# Patient Record
Sex: Female | Born: 1985 | Race: White | Hispanic: No | Smoking: Former smoker
Health system: Southern US, Community
[De-identification: ages and names within clinical notes are randomized; demographics above are authoritative.]

## PROBLEM LIST (undated history)

## (undated) DIAGNOSIS — R008 Other abnormalities of heart beat: Secondary | ICD-10-CM

## (undated) DIAGNOSIS — R87629 Unspecified abnormal cytological findings in specimens from vagina: Secondary | ICD-10-CM

## (undated) DIAGNOSIS — O1490 Unspecified pre-eclampsia, unspecified trimester: Secondary | ICD-10-CM

## (undated) DIAGNOSIS — G43909 Migraine, unspecified, not intractable, without status migrainosus: Secondary | ICD-10-CM

## (undated) DIAGNOSIS — F1111 Opioid abuse, in remission: Secondary | ICD-10-CM

## (undated) DIAGNOSIS — T4145XA Adverse effect of unspecified anesthetic, initial encounter: Secondary | ICD-10-CM

## (undated) DIAGNOSIS — T8859XA Other complications of anesthesia, initial encounter: Secondary | ICD-10-CM

## (undated) HISTORY — PX: PATELLA FRACTURE SURGERY: SHX735

## (undated) HISTORY — DX: Unspecified abnormal cytological findings in specimens from vagina: R87.629

## (undated) HISTORY — PX: CHOLECYSTECTOMY: SHX55

## (undated) HISTORY — PX: OTHER SURGICAL HISTORY: SHX169

## (undated) HISTORY — PX: FOOT SURGERY: SHX648

## (undated) HISTORY — PX: TONSILLECTOMY: SUR1361

---

## 2014-05-18 ENCOUNTER — Encounter (HOSPITAL_COMMUNITY): Payer: Self-pay

## 2014-05-18 ENCOUNTER — Emergency Department (HOSPITAL_COMMUNITY)
Admission: EM | Admit: 2014-05-18 | Discharge: 2014-05-18 | Disposition: A | Discharge status: Home / Self Care | Payer: Medicaid - Out of State | Attending: Emergency Medicine | Admitting: Emergency Medicine

## 2014-05-18 DIAGNOSIS — B86 Scabies: Secondary | ICD-10-CM | POA: Insufficient documentation

## 2014-05-18 DIAGNOSIS — Z79899 Other long term (current) drug therapy: Secondary | ICD-10-CM | POA: Diagnosis not present

## 2014-05-18 DIAGNOSIS — T2040XA Corrosion of unspecified degree of head, face, and neck, unspecified site, initial encounter: Secondary | ICD-10-CM | POA: Insufficient documentation

## 2014-05-18 DIAGNOSIS — Y9289 Other specified places as the place of occurrence of the external cause: Secondary | ICD-10-CM | POA: Diagnosis not present

## 2014-05-18 DIAGNOSIS — Z7952 Long term (current) use of systemic steroids: Secondary | ICD-10-CM | POA: Insufficient documentation

## 2014-05-18 DIAGNOSIS — Y93E8 Activity, other personal hygiene: Secondary | ICD-10-CM | POA: Diagnosis not present

## 2014-05-18 DIAGNOSIS — Z79818 Long term (current) use of other agents affecting estrogen receptors and estrogen levels: Secondary | ICD-10-CM | POA: Diagnosis not present

## 2014-05-18 DIAGNOSIS — R21 Rash and other nonspecific skin eruption: Secondary | ICD-10-CM | POA: Diagnosis present

## 2014-05-18 DIAGNOSIS — T550X1A Toxic effect of soaps, accidental (unintentional), initial encounter: Secondary | ICD-10-CM | POA: Insufficient documentation

## 2014-05-18 MED ORDER — PREDNISONE 10 MG PO TABS: ORAL_TABLET | ORAL | Status: DC

## 2014-05-18 MED ORDER — PREDNISONE 50 MG PO TABS
60.0000 mg | ORAL_TABLET | Freq: Once | ORAL | Status: AC
  Administered 2014-05-18: 60 mg via ORAL
  Filled 2014-05-18 (×2): qty 1

## 2014-05-18 MED ORDER — HYDROCODONE-ACETAMINOPHEN 5-325 MG PO TABS: 2.0000 | ORAL_TABLET | ORAL | Status: DC | PRN

## 2014-05-18 MED ORDER — HYDROCODONE-ACETAMINOPHEN 5-325 MG PO TABS
2.0000 | ORAL_TABLET | Freq: Once | ORAL | Status: AC
  Administered 2014-05-18: 2 via ORAL
  Filled 2014-05-18: qty 2

## 2014-05-18 MED ORDER — PERMETHRIN 5 % EX CREA: TOPICAL_CREAM | CUTANEOUS | Status: DC

## 2014-05-18 NOTE — Discharge Instructions (Signed)
Chemical Burn Many chemicals can burn the skin. A chemical burn should be flushed with cool water and checked by an emergency caregiver. Your skin is a natural barrier to infection. It is the largest organ of your body. Burns damage this natural protection. To help prevent infection, it is very important to follow your caregiver's instructions in the care of your burn.  Many industrial chemicals may cause burns. These chemicals include acids, alkalis, and organic compounds such as petroleum, phenol, bitumen, tar, and grease. When acids come in contact with the skin, they cause an immediate change in the skin.Acid burns produce significant pain and form a scab (eschar). Usually, the immediate skin changes are the only damage from an acid burn.However, exposure to formic acid, chromic acid, or hydrofluoric acid may affect the whole body and may even be life-threatening. Alkalis include lye, cement, lime, and many chemicals with "hydroxide" in their name.An alkali burn may be less apparent than an acid burn at first. However, alkalis may cause greater tissue damage.It is important to be aware of any chemicals you are using. Treat any exposure to skin, eyes, or mucous membranes (nose, mouth, throat) as a potential emergency. PREVENTION  Avoid exposure to toxic chemicals that can cause burns.  Store chemicals out of the reach of children.  Use protective gloves when handling dangerous chemicals. HOME CARE INSTRUCTIONS   Wash your hands well before changing your bandage.  Change your bandage as often as directed by your caregiver.  Remove the old bandage. If the bandage sticks, you may soak it off with cool, clean water.  Cleanse the burn thoroughly but gently with mild soap and water.  Pat the area dry with a clean, dry cloth.  Apply a thin layer of antibacterial cream to the burn.  Apply a clean bandage as instructed by your caregiver.  Keep the bandage as clean and dry as  possible.  Elevate the affected area for the first 24 hours, then as instructed by your caregiver.  Only take over-the-counter or prescription medicines for pain, discomfort, or fever as directed by your caregiver.  Keep all follow-up appointments.This is important. This is how your caregiver can tell if your treatment is working. SEEK IMMEDIATE MEDICAL CARE IF:   You develop excessive pain.  You develop redness, tenderness, swelling, or red streaks near the burn.  The burned area develops yellowish-white fluid (pus) or a bad smell.  You have a fever. MAKE SURE YOU:   Understand these instructions.  Will watch your condition.  Will get help right away if you are not doing well or get worse. Document Released: 04/09/2004 Document Revised: 09/26/2011 Document Reviewed: 11/29/2010 [facility] Patient Information 2015 [place], [place]. This information is not intended to replace advice given to you by your health care provider. Make sure you discuss any questions you have with your health care provider. Scabies Scabies are small bugs (mites) that burrow under the skin and cause red bumps and severe itching. These bugs can only be seen with a microscope. Scabies are highly contagious. They can spread easily from person to person by direct contact. They are also spread through sharing clothing or linens that have the scabies mites living in them. It is not unusual for an entire family to become infected through shared towels, clothing, or bedding.  HOME CARE INSTRUCTIONS   Your caregiver may prescribe a cream or lotion to kill the mites. If cream is prescribed, massage the cream into the entire body from the neck to the  bottom of both feet. Also massage the cream into the scalp and face if your child is less than 47 year old. Avoid the eyes and mouth. Do not wash your hands after application.  Leave the cream on for 8 to 12 hours. Your child should bathe or shower after the 8 to 12 hour  application period. Sometimes it is helpful to apply the cream to your child right before bedtime.  One treatment is usually effective and will eliminate approximately 95% of infestations. For severe cases, your caregiver may decide to repeat the treatment in 1 week. Everyone in your household should be treated with one application of the cream.  New rashes or burrows should not appear within 24 to 48 hours after successful treatment. However, the itching and rash may last for 2 to 4 weeks after successful treatment. Your caregiver may prescribe a medicine to help with the itching or to help the rash go away more quickly.  Scabies can live on clothing or linens for up to 3 days. All of your child's recently used clothing, towels, stuffed toys, and bed linens should be washed in hot water and then dried in a dryer for at least 20 minutes on high heat. Items that cannot be washed should be enclosed in a plastic bag for at least 3 days.  To help relieve itching, bathe your child in a cool bath or apply cool washcloths to the affected areas.  Your child may return to school after treatment with the prescribed cream. SEEK MEDICAL CARE IF:   The itching persists longer than 4 weeks after treatment.  The rash spreads or becomes infected. Signs of infection include red blisters or yellow-tan crust. Document Released: 07/04/2005 Document Revised: 09/26/2011 Document Reviewed: 11/12/2008 [facility] Patient Information 2015 [place], [place]. This information is not intended to replace advice given to you by your health care provider. Make sure you discuss any questions you have with your health care provider.

## 2014-05-18 NOTE — ED Provider Notes (Signed)
CSN: [id number]     Arrival date & time 05/18/14  1950 History   First MD Initiated Contact with Patient 05/18/14 2244     Chief Complaint  Patient presents with  . Allergic Reaction     (Consider location/radiation/quality/duration/timing/severity/associated sxs/prior Treatment) Patient is a 28 y.o. female presenting with allergic reaction. The history is provided by the patient. No language interpreter was used.  Allergic Reaction Presenting symptoms: itching, rash and swelling   Rash:    Location:  Full body   Quality: itchiness and redness     Severity:  Moderate   Onset quality:  Gradual   Timing:  Constant   Progression:  Worsening Severity:  Severe Prior allergic episodes:  No prior episodes Context: chemicals   Relieved by:  Nothing Worsened by:  Nothing tried Ineffective treatments:  None tried Pt used a friends face wash on Thursday.   Pt reports now face is red, skin feels tight and is painful.  Pt also complains of itchy rash.  Worse on abdomen  History reviewed. No pertinent past medical history. Past Surgical History  Procedure Laterality Date  . Tonsillectomy    . Cholecystectomy     No family history on file. History  Substance Use Topics  . Smoking status: Never Smoker   . Smokeless tobacco: Not on file  . Alcohol Use: No   OB History    No data available     Review of Systems  Skin: Positive for itching and rash.  All other systems reviewed and are negative.     Allergies  Dilaudid and Morphine and related  Home Medications   Prior to Admission medications   Medication Sig Start Date End Date Taking? Authorizing Provider  ibuprofen (ADVIL,MOTRIN) 200 MG tablet Take 200 mg by mouth every 6 (six) hours as needed for mild pain or moderate pain.   Yes Historical Provider, MD  medroxyPROGESTERone (DEPO-PROVERA) 150 MG/ML injection Inject 150 mg into the muscle every 3 (three) months.   Yes Historical Provider, MD  HYDROcodone-acetaminophen  (NORCO/VICODIN) 5-325 MG per tablet Take 2 tablets by mouth every 4 (four) hours as needed. 05/18/14   Fransico Meadow, PA-C  permethrin (ELIMITE) 5 % cream Apply to affected area once 05/18/14   Fransico Meadow, PA-C  predniSONE (DELTASONE) 10 MG tablet 5,4,3,2,1 taper 05/18/14   Hollace Kinnier Valeri Sula, PA-C   BP 127/70 mmHg  Pulse 52  Temp(Src) 98.2 F (36.8 C) (Oral)  Resp 24  Ht 5\' 3"  (1.6 m)  Wt 136 lb (61.689 kg)  BMI 24.10 kg/m2  SpO2 96% Physical Exam  Constitutional: She is oriented to person, place, and time. She appears well-developed and well-nourished.  HENT:  Head: Normocephalic.  Erythema and swelling face.  Scabbed area forehead and nose.  Body,  Multiple burrow looking areas  Eyes: EOM are normal. Pupils are equal, round, and reactive to light.  Neck: Normal range of motion.  Cardiovascular: Normal rate.   Pulmonary/Chest: Effort normal.  Abdominal: She exhibits no distension.  Musculoskeletal: Normal range of motion.  Neurological: She is alert and oriented to person, place, and time.  Psychiatric: She has a normal mood and affect.  Nursing note and vitals reviewed.   ED Course  Procedures (including critical care time) Labs Review Labs Reviewed - No data to display  Imaging Review No results found.   EKG Interpretation None      MDM  I think rash on body arms and legs is probably scabies.  Face redness and swelling second to allergic reaction/now chemical burn to face.    Final diagnoses:  Chemical burn of face, initial encounter  Scabies    elemite Prednisone hydrocodone    Fransico Meadow, PA-C 05/19/14 [place], PA-C 05/19/14 0009

## 2014-05-18 NOTE — ED Notes (Signed)
I used a different face wash on Thursday. Friday I used my normal face wash. I used my moisturizer on Friday and it has tanning properties in it. I probably should not have used it. My skin felt different yesterday and was red last night and got worse today.

## 2014-05-18 NOTE — ED Notes (Signed)
Pt alert & oriented x4, stable gait. Patient given discharge instructions, paperwork & prescription(s). Patient  instructed to stop at the registration desk to finish any additional paperwork. Patient verbalized understanding. Pt left department w/ no further questions. 

## 2016-02-16 ENCOUNTER — Emergency Department (HOSPITAL_COMMUNITY): Payer: Medicaid Other

## 2016-02-16 ENCOUNTER — Encounter (HOSPITAL_COMMUNITY): Payer: Self-pay | Admitting: Emergency Medicine

## 2016-02-16 ENCOUNTER — Emergency Department (HOSPITAL_COMMUNITY)
Admission: EM | Admit: 2016-02-16 | Discharge: 2016-02-17 | Disposition: A | Discharge status: Home / Self Care | Payer: Medicaid Other | Attending: Emergency Medicine | Admitting: Emergency Medicine

## 2016-02-16 DIAGNOSIS — Z79899 Other long term (current) drug therapy: Secondary | ICD-10-CM | POA: Diagnosis not present

## 2016-02-16 DIAGNOSIS — R102 Pelvic and perineal pain: Secondary | ICD-10-CM

## 2016-02-16 DIAGNOSIS — Z3A01 Less than 8 weeks gestation of pregnancy: Secondary | ICD-10-CM | POA: Diagnosis not present

## 2016-02-16 DIAGNOSIS — O2341 Unspecified infection of urinary tract in pregnancy, first trimester: Secondary | ICD-10-CM | POA: Diagnosis not present

## 2016-02-16 DIAGNOSIS — O26899 Other specified pregnancy related conditions, unspecified trimester: Secondary | ICD-10-CM

## 2016-02-16 DIAGNOSIS — Z3490 Encounter for supervision of normal pregnancy, unspecified, unspecified trimester: Secondary | ICD-10-CM

## 2016-02-16 DIAGNOSIS — N390 Urinary tract infection, site not specified: Secondary | ICD-10-CM

## 2016-02-16 HISTORY — DX: Other abnormalities of heart beat: R00.8

## 2016-02-16 LAB — URINALYSIS, ROUTINE W REFLEX MICROSCOPIC
Bilirubin Urine: NEGATIVE
Glucose, UA: NEGATIVE mg/dL
Ketones, ur: NEGATIVE mg/dL
Nitrite: NEGATIVE
Protein, ur: 30 mg/dL — AB
Specific Gravity, Urine: 1.029 (ref 1.005–1.030)
pH: 6.5 (ref 5.0–8.0)

## 2016-02-16 LAB — WET PREP, GENITAL
Clue Cells Wet Prep HPF POC: NONE SEEN
Sperm: NONE SEEN
Trich, Wet Prep: NONE SEEN
Yeast Wet Prep HPF POC: NONE SEEN

## 2016-02-16 LAB — I-STAT BETA HCG BLOOD, ED (MC, WL, AP ONLY): I-stat hCG, quantitative: 705.2 m[IU]/mL — ABNORMAL HIGH (ref <5)

## 2016-02-16 LAB — URINE MICROSCOPIC-ADD ON

## 2016-02-16 LAB — PREGNANCY, URINE: Preg Test, Ur: POSITIVE — AB

## 2016-02-16 NOTE — ED Notes (Signed)
Provider in room  

## 2016-02-16 NOTE — ED Provider Notes (Signed)
[facility] DEPT Provider Note   CSN: [id number] Arrival date & time: 02/16/16  1836  First Provider Contact:  First MD Initiated Contact with Patient 02/16/16 2148        History   Chief Complaint Chief Complaint  Patient presents with  . Pelvic Pain    HPI Kristy Hunt is a 30 y.o. female.  Patient presents with pelvic pain. She states she's had about a 2 to three-week history of pelvic pain. It's been constant and worsening. She's been seeing her PCP at [facility]. She initially was diagnosed with a UTI and took 2 rounds of antibiotics. She then had a pelvic exam and was treated with Rocephin and doxycycline. She's having no improvement in symptoms. She denies any vaginal discharge. No bleeding. She denies any vomiting although she's had a little bit of nausea which each release of the pain. She denies any known fevers.    Pelvic Pain  Pertinent negatives include no chest pain, no abdominal pain, no headaches and no shortness of breath.    Past Medical History:  Diagnosis Date  . Bigeminal pulse     There are no active problems to display for this patient.   Past Surgical History:  Procedure Laterality Date  . CHOLECYSTECTOMY    . infection in foot removed     . TONSILLECTOMY      OB History    No data available       Home Medications    Prior to Admission medications   Medication Sig Start Date End Date Taking? Authorizing Provider  doxycycline (VIBRAMYCIN) 100 MG capsule Take 100 mg by mouth 2 (two) times daily.   Yes Historical Provider, MD  gabapentin (NEURONTIN) 300 MG capsule Take 300 mg by mouth 2 (two) times daily.   Yes Historical Provider, MD    Family History No family history on file.  Social History Social History  Substance Use Topics  . Smoking status: Never Smoker  . Smokeless tobacco: Never Used  . Alcohol use No     Allergies   Dilaudid [hydromorphone hcl] and Morphine and related   Review of  Systems Review of Systems  Constitutional: Negative for chills, diaphoresis, fatigue and fever.  HENT: Negative for congestion, rhinorrhea and sneezing.   Eyes: Negative.   Respiratory: Negative for cough, chest tightness and shortness of breath.   Cardiovascular: Negative for chest pain and leg swelling.  Gastrointestinal: Positive for nausea. Negative for abdominal pain, blood in stool, diarrhea and vomiting.  Genitourinary: Positive for pelvic pain. Negative for difficulty urinating, flank pain, frequency and hematuria.  Musculoskeletal: Negative for arthralgias and back pain.  Skin: Negative for rash.  Neurological: Negative for dizziness, speech difficulty, weakness, numbness and headaches.     Physical Exam Updated Vital Signs BP 117/86 (BP Location: Left Arm)   Pulse 67   Temp 98 F (36.7 C) (Oral)   Resp 17   Ht 5\' 3"  (1.6 m)   Wt 129 lb (58.5 kg)   LMP 01/11/2016   SpO2 100%   BMI 22.85 kg/m   Physical Exam  Constitutional: She is oriented to person, place, and time. She appears well-developed and well-nourished.  HENT:  Head: Normocephalic and atraumatic.  Eyes: Pupils are equal, round, and reactive to light.  Neck: Normal range of motion. Neck supple.  Cardiovascular: Normal rate, regular rhythm and normal heart sounds.   Pulmonary/Chest: Effort normal and breath sounds normal. No respiratory distress. She has no wheezes. She has no rales.  She exhibits no tenderness.  Abdominal: Soft. Bowel sounds are normal. There is no tenderness. There is no rebound and no guarding.  Genitourinary:  Genitourinary Comments: Positive tenderness to suprapubic area. Pelvic exam shows scant vaginal discharge. Positive tenderness over the uterus. There is mild tenderness over the adnexa bilaterally. No rashes are noted.  Musculoskeletal: Normal range of motion. She exhibits no edema.  Lymphadenopathy:    She has no cervical adenopathy.  Neurological: She is alert and oriented to  person, place, and time.  Skin: Skin is warm and dry. No rash noted.  Psychiatric: She has a normal mood and affect.     ED Treatments / Results  Labs (all labs ordered are listed, but only abnormal results are displayed) Labs Reviewed  WET PREP, GENITAL  PREGNANCY, URINE  URINALYSIS, ROUTINE W REFLEX MICROSCOPIC (NOT AT [facility])  GC/CHLAMYDIA PROBE AMP ([facility]) NOT AT [facility]    EKG  EKG Interpretation None       Radiology No results found.  Procedures Procedures (including critical care time)  Medications Ordered in ED Medications - No data to display   Initial Impression / Assessment and Plan / ED Course  I have reviewed the triage vital signs and the nursing notes.  Pertinent labs & imaging results that were available during my care of the patient were reviewed by me and considered in my medical decision making (see chart for details).  Clinical Course    PT awaiting pelvic US.  +preg test.  Dr. Randal Buba to follow.  Final Clinical Impressions(s) / ED Diagnoses   Final diagnoses:  None    New Prescriptions New Prescriptions   No medications on file     Malvin Johns, MD 02/17/16 1404

## 2016-02-16 NOTE — Progress Notes (Signed)
Patient listed as having Medicaid out of state.  EDCM went to speak to patient at bedside, however doctor at bedside performing a procedure.  Patient will need to apply for Medicaid here in [place] if her Medicaid is from [facility] office  [facility] of [facility] [address] [place]

## 2016-02-16 NOTE — ED Triage Notes (Signed)
Patient states that she has pelvic pain for 3 weeks . Patient was recently treated with antibiotics for UTI symptoms and went back on Friday because symptoms still not getting any better after being on antibiotics.  Patient had pelvic exam and supposed to go for scan today but heard nothing from MD office about scan.

## 2016-02-17 LAB — GC/CHLAMYDIA PROBE AMP (~~LOC~~) NOT AT ARMC
Chlamydia: NEGATIVE
Neisseria Gonorrhea: NEGATIVE

## 2016-02-17 MED ORDER — NITROFURANTOIN MONOHYD MACRO 100 MG PO CAPS: 100.0000 mg | ORAL_CAPSULE | Freq: Two times a day (BID) | ORAL | 0 refills | Status: DC

## 2016-02-18 ENCOUNTER — Encounter (HOSPITAL_COMMUNITY): Payer: Self-pay | Admitting: *Deleted

## 2016-02-18 ENCOUNTER — Inpatient Hospital Stay (HOSPITAL_COMMUNITY): Payer: Medicaid Other

## 2016-02-18 ENCOUNTER — Inpatient Hospital Stay (HOSPITAL_COMMUNITY)
Admission: AD | Admit: 2016-02-18 | Discharge: 2016-02-18 | Disposition: A | Discharge status: Home / Self Care | Payer: Medicaid Other | Source: Ambulatory Visit | Attending: Family Medicine | Admitting: Family Medicine

## 2016-02-18 DIAGNOSIS — O3680X0 Pregnancy with inconclusive fetal viability, not applicable or unspecified: Secondary | ICD-10-CM

## 2016-02-18 DIAGNOSIS — O209 Hemorrhage in early pregnancy, unspecified: Secondary | ICD-10-CM | POA: Insufficient documentation

## 2016-02-18 DIAGNOSIS — O9989 Other specified diseases and conditions complicating pregnancy, childbirth and the puerperium: Secondary | ICD-10-CM | POA: Diagnosis not present

## 2016-02-18 DIAGNOSIS — O26891 Other specified pregnancy related conditions, first trimester: Secondary | ICD-10-CM | POA: Insufficient documentation

## 2016-02-18 DIAGNOSIS — R109 Unspecified abdominal pain: Secondary | ICD-10-CM | POA: Insufficient documentation

## 2016-02-18 DIAGNOSIS — O26899 Other specified pregnancy related conditions, unspecified trimester: Secondary | ICD-10-CM

## 2016-02-18 HISTORY — DX: Other complications of anesthesia, initial encounter: T88.59XA

## 2016-02-18 HISTORY — DX: Adverse effect of unspecified anesthetic, initial encounter: T41.45XA

## 2016-02-18 LAB — URINE MICROSCOPIC-ADD ON

## 2016-02-18 LAB — URINALYSIS, ROUTINE W REFLEX MICROSCOPIC
Bilirubin Urine: NEGATIVE
Glucose, UA: NEGATIVE mg/dL
Ketones, ur: 15 mg/dL — AB
Leukocytes, UA: NEGATIVE
Nitrite: NEGATIVE
Protein, ur: 100 mg/dL — AB
Specific Gravity, Urine: >1.03 — ABNORMAL HIGH (ref 1.005–1.030)
pH: 6 (ref 5.0–8.0)

## 2016-02-18 LAB — HCG, QUANTITATIVE, PREGNANCY: hCG, Beta Chain, Quant, S: 1016 m[IU]/mL — ABNORMAL HIGH (ref <5)

## 2016-02-18 LAB — ABO/RH: ABO/RH(D): A POS

## 2016-02-18 MED ORDER — OXYCODONE-ACETAMINOPHEN 5-325 MG PO TABS: 1.0000 | ORAL_TABLET | ORAL | 0 refills | Status: DC | PRN

## 2016-02-18 MED ORDER — OXYCODONE-ACETAMINOPHEN 5-325 MG PO TABS
1.0000 | ORAL_TABLET | Freq: Once | ORAL | Status: AC
  Administered 2016-02-18: 1 via ORAL
  Filled 2016-02-18: qty 1

## 2016-02-18 NOTE — Discharge Instructions (Signed)

## 2016-02-18 NOTE — MAU Note (Signed)
Patient presents at [redacted] weeks gestation with c/o pelvic pressure since the 15th of July and vaginal spotting since the 15th of July as well.

## 2016-02-18 NOTE — MAU Provider Note (Signed)
History     CSN: [id number]  Arrival date and time: 02/18/16 1613   None     Chief Complaint  Patient presents with  . Pelvic Pressure  . Vaginal Bleeding   HPI   Ms.Kristy Hunt is a 30 y.o. female G4P3 @[redacted]w[redacted]d  here in MAU with pelvic pressure and spotting. She was seen 2 days ago at [facility] for abdominal pain. She has had this pain off and on for a few weeks. She was seen by her PCP in July and was told she had a UTI and was treated for this. She was then told she had a pelvis infection and was placed on Doxy. She feels the pain is related to the pregnancy. She doesn't feel like the pain is "normal".   She feels the pain is more like pressure in her lower pelvis. The pain/pressure worsens when she is standing.  She started spotting today. The spotting is very light.   OB History    Gravida Para Term Preterm AB Living   4 3       3    SAB TAB Ectopic Multiple Live Births                  Past Medical History:  Diagnosis Date  . Bigeminal pulse   . Complication of anesthesia     Past Surgical History:  Procedure Laterality Date  . CHOLECYSTECTOMY    . infection in foot removed     . TONSILLECTOMY      History reviewed. No pertinent family history.  Social History  Substance Use Topics  . Smoking status: Never Smoker  . Smokeless tobacco: Never Used  . Alcohol use No    Allergies:  Allergies  Allergen Reactions  . Dilaudid [Hydromorphone Hcl] Hives and Shortness Of Breath  . Morphine And Related Hives and Shortness Of Breath    Prescriptions Prior to Admission  Medication Sig Dispense Refill Last Dose  . gabapentin (NEURONTIN) 300 MG capsule Take 300 mg by mouth 2 (two) times daily.   02/18/2016 at Unknown time  . nitrofurantoin, macrocrystal-monohydrate, (MACROBID) 100 MG capsule Take 1 capsule (100 mg total) by mouth 2 (two) times daily. X 7 days (Patient not taking: Reported on 02/18/2016) 14 capsule 0 Not Taking at Unknown time  .  nitrofurantoin, macrocrystal-monohydrate, (MACROBID) 100 MG capsule Take 1 capsule (100 mg total) by mouth 2 (two) times daily. X 7 days (Patient not taking: Reported on 02/18/2016) 14 capsule 0 Not Taking at Unknown time   Results for orders placed or performed during the hospital encounter of 02/18/16 (from the past 48 hour(s))  Urinalysis, Routine w reflex microscopic (not at [facility])     Status: Abnormal   Collection Time: 02/18/16  4:20 PM  Result Value Ref Range   Color, Urine YELLOW YELLOW   APPearance HAZY (A) CLEAR   Specific Gravity, Urine >1.030 (H) 1.005 - 1.030   pH 6.0 5.0 - 8.0   Glucose, UA NEGATIVE NEGATIVE mg/dL   Hgb urine dipstick LARGE (A) NEGATIVE   Bilirubin Urine NEGATIVE NEGATIVE   Ketones, ur 15 (A) NEGATIVE mg/dL   Protein, ur 100 (A) NEGATIVE mg/dL   Nitrite NEGATIVE NEGATIVE   Leukocytes, UA NEGATIVE NEGATIVE  Urine microscopic-add on     Status: Abnormal   Collection Time: 02/18/16  4:20 PM  Result Value Ref Range   Squamous Epithelial / LPF 0-5 (A) NONE SEEN   WBC, UA 0-5 0 - 5 WBC/hpf   RBC /  HPF 6-30 0 - 5 RBC/hpf   Bacteria, UA FEW (A) NONE SEEN   Urine-Other MUCOUS PRESENT   ABO/Rh     Status: None (Preliminary result)   Collection Time: 02/18/16  5:33 PM  Result Value Ref Range   ABO/RH(D) A POS   hCG, quantitative, pregnancy     Status: Abnormal   Collection Time: 02/18/16  5:33 PM  Result Value Ref Range   hCG, Beta Chain, Quant, S 1,016 (H) <5 mIU/mL    Comment:          GEST. AGE      CONC.  (mIU/mL)   <=1 WEEK        5 - 50     2 WEEKS       50 - 500     3 WEEKS       100 - 10,000     4 WEEKS     1,000 - 30,000     5 WEEKS     3,500 - 115,000   6-8 WEEKS     12,000 - 270,000    12 WEEKS     15,000 - 220,000        FEMALE AND NON-PREGNANT FEMALE:     LESS THAN 5 mIU/mL    US Ob Transvaginal  Result Date: 02/18/2016 CLINICAL DATA:  30 year old female with worsening abdominal pain in the first trimester of pregnancy. Early IUP suspected  on ultrasound 2 days ago. Subsequent encounter. Quantitative beta HCG 1,016 today, versus 705 2 days ago. Estimated gestational age by LMP 5 weeks 3 days. EXAM: TRANSVAGINAL OB ULTRASOUND TECHNIQUE: Transvaginal ultrasound was performed for complete evaluation of the gestation as well as the maternal uterus, adnexal regions, and pelvic cul-de-sac. COMPARISON:  02/16/2016 FINDINGS: Intrauterine gestational sac: 3.6 cm elongated fluid collection in the endometrial canal, which may reflect an irregularly shaped gestational sac. This appears more elongated than 2 days ago. Yolk sac:  Not visible Embryo:  Questionably visible on images 39 and 48 Cardiac Activity: None detected Presumed MSD: 17.7  mm   6 w   5  d Presumed CRL:   2.1  mm   5 w 5 d Maternal uterus/adnexae: No subchorionic hemorrhage or pelvic free fluid. Corpus luteum demonstrated in the right ovary which measures 4.5 x 2.9 x 3.5 cm. Normal left ovary which measures 1.8 x 3.0 x 1.7 cm. IMPRESSION: Findings are suspicious but not yet definitive for failed pregnancy. Recommend follow-up US in 10-14 days for definitive diagnosis. This recommendation follows SRU consensus guidelines: Diagnostic Criteria for Nonviable Pregnancy Early in the First Trimester. [place] Med [id number]. Electronically Signed   By: Genevie Ann M.D.   On: 02/18/2016 19:13    Review of Systems  Constitutional: Negative for chills and fever.  Gastrointestinal: Positive for abdominal pain. Negative for constipation, diarrhea, nausea and vomiting.  Genitourinary: Negative for dysuria, frequency and urgency.       + Pressure    Physical Exam   Blood pressure 111/69, pulse 79, temperature 98.7 F (37.1 C), temperature source Oral, resp. rate 16, height 5\' 3"  (1.6 m), weight 129 lb (58.5 kg), last menstrual period 01/11/2016.  Physical Exam  Constitutional: She is oriented to person, place, and time. She appears well-developed and well-nourished. No distress.  HENT:  Head:  Normocephalic.  Eyes: Pupils are equal, round, and reactive to light.  Neck: Neck supple.  Respiratory: Effort normal.  GI: Soft. There is tenderness in the suprapubic area.  There is no rigidity, no rebound and no guarding.  Genitourinary:  Genitourinary Comments: Bimanual exam: Cervix closed, no CMT. No blood noted on exam Uterus non tender, slightly enlarged. Adnexa non tender, no masses bilaterally Chaperone present for exam.  Musculoskeletal: Normal range of motion.  Neurological: She is alert and oriented to person, place, and time.  Skin: Skin is warm. She is not diaphoretic.  Psychiatric: Her behavior is normal.    MAU Course  Procedures  None  MDM  Urine culture pending  HCG 8/1- 705 HCG 8/3- 1016  [place]   A positive blood type Discussed patient with Dr. Nehemiah Settle Percocet 1 tab. Will evaluate pain level in 1 hour and if some improvement will send her home and plan for her to return in 48 hours for quant.   Report given to Marcille Buffy CNM who resumes care of the patient.  2040: Patient reports that her pain is improving with percocet. Will DC home and FU as planned.   Assessment and Plan   A:  1. Pregnancy of unknown anatomic location   2. Abdominal pain in pregnancy     P:  Discharge home in stable condition Ectopic precautions Pelvic rest  Return to MAU in 48 hours for repeat Quant or sooner if symptoms worsen Bleeding precautions.   Lezlie Lye, NP 02/18/2016 6:21 PM

## 2016-02-19 LAB — URINE CULTURE

## 2016-02-20 ENCOUNTER — Encounter (HOSPITAL_COMMUNITY): Payer: Self-pay | Admitting: *Deleted

## 2016-02-20 ENCOUNTER — Inpatient Hospital Stay (HOSPITAL_COMMUNITY): Payer: Medicaid Other

## 2016-02-20 ENCOUNTER — Inpatient Hospital Stay (HOSPITAL_COMMUNITY)
Admission: AD | Admit: 2016-02-20 | Discharge: 2016-02-21 | Disposition: A | Discharge status: Home / Self Care | Payer: Medicaid Other | Source: Ambulatory Visit | Attending: Family Medicine | Admitting: Family Medicine

## 2016-02-20 DIAGNOSIS — R109 Unspecified abdominal pain: Secondary | ICD-10-CM

## 2016-02-20 DIAGNOSIS — O26891 Other specified pregnancy related conditions, first trimester: Secondary | ICD-10-CM | POA: Diagnosis present

## 2016-02-20 DIAGNOSIS — O3680X0 Pregnancy with inconclusive fetal viability, not applicable or unspecified: Secondary | ICD-10-CM

## 2016-02-20 DIAGNOSIS — O26899 Other specified pregnancy related conditions, unspecified trimester: Secondary | ICD-10-CM

## 2016-02-20 DIAGNOSIS — R102 Pelvic and perineal pain: Secondary | ICD-10-CM | POA: Insufficient documentation

## 2016-02-20 DIAGNOSIS — O9989 Other specified diseases and conditions complicating pregnancy, childbirth and the puerperium: Secondary | ICD-10-CM | POA: Diagnosis not present

## 2016-02-20 DIAGNOSIS — Z3A01 Less than 8 weeks gestation of pregnancy: Secondary | ICD-10-CM | POA: Diagnosis not present

## 2016-02-20 LAB — URINE MICROSCOPIC-ADD ON

## 2016-02-20 LAB — URINALYSIS, ROUTINE W REFLEX MICROSCOPIC
Bilirubin Urine: NEGATIVE
Glucose, UA: NEGATIVE mg/dL
Ketones, ur: 15 mg/dL — AB
Nitrite: NEGATIVE
Protein, ur: 100 mg/dL — AB
Specific Gravity, Urine: 1.02 (ref 1.005–1.030)
pH: 7 (ref 5.0–8.0)

## 2016-02-20 LAB — HCG, QUANTITATIVE, PREGNANCY: hCG, Beta Chain, Quant, S: 2321 m[IU]/mL — ABNORMAL HIGH (ref <5)

## 2016-02-20 LAB — CBC
HCT: 37.3 % (ref 36.0–46.0)
Hemoglobin: 12.8 g/dL (ref 12.0–15.0)
MCH: 27.6 pg (ref 26.0–34.0)
MCHC: 34.3 g/dL (ref 30.0–36.0)
MCV: 80.6 fL (ref 78.0–100.0)
Platelets: 177 10*3/uL (ref 150–400)
RBC: 4.63 MIL/uL (ref 3.87–5.11)
RDW: 12.7 % (ref 11.5–15.5)
WBC: 6.5 10*3/uL (ref 4.0–10.5)

## 2016-02-20 LAB — WET PREP, GENITAL
Clue Cells Wet Prep HPF POC: NONE SEEN
Sperm: NONE SEEN
Trich, Wet Prep: NONE SEEN
Yeast Wet Prep HPF POC: NONE SEEN

## 2016-02-20 MED ORDER — OXYCODONE-ACETAMINOPHEN 5-325 MG PO TABS
2.0000 | ORAL_TABLET | Freq: Once | ORAL | Status: AC
  Administered 2016-02-20: 2 via ORAL
  Filled 2016-02-20: qty 2

## 2016-02-20 NOTE — MAU Provider Note (Signed)
History     CSN: [id number]  Arrival date and time: 02/20/16 2032   First Provider Initiated Contact with Patient 02/20/16 2147       Chief Complaint  Patient presents with  . Pelvic Pain  . Follow-up    BHCG   HPI Kristy Hunt is a 30 y.o. G4P3 at [redacted]w[redacted]d by LMP who presents for follow up BHCG & pelvic pain. Pt initially seen at [facility] on 8/1 for pelvic pain & found to be pregnant. BHCG on that day was 705 & ultrasound showed "probable irregular gestational sac containing a fetal pole". Returned to MAU 8/3 for f/u BHCG, that day was 1016 & ultrasound showed possible irregularly shaped IUGS, no yolk sac. Pt presents today with worsening pelvic pain. Describes pain as tingling & pressure. Rates pain 9/10. Has been treating with percocet that was prescribed to her during her last visit; moderate improvement in pain. Pain aggravated by standing & movement.  Denies vaginal bleeding, n/v/d, constipation, dysuria. Noticed moderate amount of vaginal discharge x 1 today; no irritation or odor, doesn't know what discharge looked like.   OB History    Gravida Para Term Preterm AB Living   4 3       3    SAB TAB Ectopic Multiple Live Births                  Past Medical History:  Diagnosis Date  . Bigeminal pulse   . Complication of anesthesia     Past Surgical History:  Procedure Laterality Date  . CHOLECYSTECTOMY    . infection in foot removed     . TONSILLECTOMY      History reviewed. No pertinent family history.  Social History  Substance Use Topics  . Smoking status: Never Smoker  . Smokeless tobacco: Never Used  . Alcohol use No    Allergies:  Allergies  Allergen Reactions  . Dilaudid [Hydromorphone Hcl] Hives and Shortness Of Breath  . Morphine And Related Hives and Shortness Of Breath    Pt states she can tolerate Percocet    Prescriptions Prior to Admission  Medication Sig Dispense Refill Last Dose  . gabapentin (NEURONTIN) 300 MG capsule Take 300 mg by  mouth 2 (two) times daily.   02/20/2016 at Unknown time  . oxyCODONE-acetaminophen (PERCOCET/ROXICET) 5-325 MG tablet Take 1-2 tablets by mouth every 4 (four) hours as needed for severe pain. 10 tablet 0 02/20/2016 at 1600    Review of Systems  Constitutional: Negative for chills and fever.  Gastrointestinal: Positive for abdominal pain and nausea. Negative for constipation, diarrhea and vomiting.  Genitourinary: Negative for dysuria.       + vaginal discharge No vaginal bleeding   Physical Exam   Blood pressure 112/68, pulse 78, temperature 98.3 F (36.8 C), temperature source Oral, resp. rate 16, height 5\' 3"  (1.6 m), weight 130 lb (59 kg), last menstrual period 01/11/2016, SpO2 97 %.  Physical Exam  Nursing note and vitals reviewed. Constitutional: She is oriented to person, place, and time. She appears well-developed and well-nourished. No distress.  HENT:  Head: Normocephalic and atraumatic.  Eyes: Conjunctivae are normal. Right eye exhibits no discharge. Left eye exhibits no discharge. No scleral icterus.  Neck: Normal range of motion.  Cardiovascular: Normal rate, regular rhythm and normal heart sounds.   No murmur heard. Respiratory: Effort normal and breath sounds normal. No respiratory distress. She has no wheezes.  GI: Soft. Bowel sounds are normal. She exhibits no distension. There is  tenderness in the right lower quadrant. There is no rebound and no guarding.  Genitourinary: Uterus normal. Cervix exhibits no motion tenderness and no friability. Right adnexum displays no mass, no tenderness and no fullness. Left adnexum displays no mass, no tenderness and no fullness. No bleeding in the vagina. Vaginal discharge (moderate amount of thin tan discharge) found.  Genitourinary Comments: Cervix closed  Neurological: She is alert and oriented to person, place, and time.  Skin: Skin is warm and dry. She is not diaphoretic.  Psychiatric: She has a normal mood and affect. Her behavior  is normal. Judgment and thought content normal.    MAU Course  Procedures Results for orders placed or performed during the hospital encounter of 02/20/16 (from the past 24 hour(s))  hCG, quantitative, pregnancy     Status: Abnormal   Collection Time: 02/20/16  8:56 PM  Result Value Ref Range   hCG, Beta Chain, Quant, S 2,321 (H) <5 mIU/mL  CBC     Status: None   Collection Time: 02/20/16  8:56 PM  Result Value Ref Range   WBC 6.5 4.0 - 10.5 K/uL   RBC 4.63 3.87 - 5.11 MIL/uL   Hemoglobin 12.8 12.0 - 15.0 g/dL   HCT 37.3 36.0 - 46.0 %   MCV 80.6 78.0 - 100.0 fL   MCH 27.6 26.0 - 34.0 pg   MCHC 34.3 30.0 - 36.0 g/dL   RDW 12.7 11.5 - 15.5 %   Platelets 177 150 - 400 K/uL  Urinalysis, Routine w reflex microscopic (not at [facility])     Status: Abnormal   Collection Time: 02/20/16  9:30 PM  Result Value Ref Range   Color, Urine YELLOW YELLOW   APPearance HAZY (A) CLEAR   Specific Gravity, Urine 1.020 1.005 - 1.030   pH 7.0 5.0 - 8.0   Glucose, UA NEGATIVE NEGATIVE mg/dL   Hgb urine dipstick MODERATE (A) NEGATIVE   Bilirubin Urine NEGATIVE NEGATIVE   Ketones, ur 15 (A) NEGATIVE mg/dL   Protein, ur 100 (A) NEGATIVE mg/dL   Nitrite NEGATIVE NEGATIVE   Leukocytes, UA SMALL (A) NEGATIVE  Urine microscopic-add on     Status: Abnormal   Collection Time: 02/20/16  9:30 PM  Result Value Ref Range   Squamous Epithelial / LPF 0-5 (A) NONE SEEN   WBC, UA 6-30 0 - 5 WBC/hpf   RBC / HPF 6-30 0 - 5 RBC/hpf   Bacteria, UA MANY (A) NONE SEEN   Crystals CA OXALATE CRYSTALS (A) NEGATIVE   Urine-Other MUCOUS PRESENT   Wet prep, genital     Status: Abnormal   Collection Time: 02/20/16 10:02 PM  Result Value Ref Range   Yeast Wet Prep HPF POC NONE SEEN NONE SEEN   Trich, Wet Prep NONE SEEN NONE SEEN   Clue Cells Wet Prep HPF POC NONE SEEN NONE SEEN   WBC, Wet Prep HPF POC FEW (A) NONE SEEN   Sperm NONE SEEN    US Ob Transvaginal  Result Date: 02/20/2016 CLINICAL DATA:  30 year old female  pregnant female presenting with abdominal pain EXAM: TRANSVAGINAL OB ULTRASOUND TECHNIQUE: Transvaginal ultrasound was performed for complete evaluation of the gestation as well as the maternal uterus, adnexal regions, and pelvic cul-de-sac. COMPARISON:  Ultrasound dated 02/18/2016 and 02/16/2016 FINDINGS: The uterus is anteverted and appears homogeneous. 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 No intrauterine pregnancy identified. The previously seen gestational sac or fluid collection is not identified on this study. Small amount of fluid with  low level internal echoes noted within the endometrial canal. The maternal ovaries appear unremarkable. The right ovary measures 4.4 x 3.7 x 2.9 cm. The left ovary measures 3.4 x 2.1 x 2.2 cm. A corpus luteum is noted in the right ovary. No significant free fluid identified within pelvis. IMPRESSION: No intrauterine pregnancy identified. The previously seen gestational sac or loculated fluid collection is no longer present on this study. Findings most consistent with spontaneous miscarriage. Correlation with clinical exam and follow-up with serial HCG levels recommended. Unremarkable ovaries. Electronically Signed   By: Anner Crete M.D.   On: 02/20/2016 23:28    MDM Percocet 2 tabs po CBC, BHCG, ultrasound, wet prep Appropriate rise in BHCG from 2 days ago (although rise was not as expected during previous visit).  Ultrasound shows no intrauterine gestation & a probable right CLC. Previous ultrasounds reviewed by Dr. Kennon Rounds. Unsure if IUGS ever present & as pt denies vaginal bleeding unlikely that she passed POC. Can't exclude possibility of ectopic pregnancy.  Discussed findings with patient & pt will return to [facility] Tuesday morning for repeat BHCG. Assessment and Plan  A: 1. Pregnancy of unknown anatomic location   2. Abdominal pain affecting pregnancy   3. Pelvic pain affecting pregnancy in first trimester, antepartum      P: Discharge home F/u at [facility] Tuesday morning at 11 am for BHCG Discussed reasons to return to MAU Ectopic vs SAB precautions  Leauna Schmuck 02/20/2016, 9:47 PM

## 2016-02-20 NOTE — MAU Note (Signed)
Pt reports she is here for repeat labs ( have been drawn), but she is having a lot more pressure today than in the past. Denies bleeding.

## 2016-02-21 DIAGNOSIS — O9989 Other specified diseases and conditions complicating pregnancy, childbirth and the puerperium: Secondary | ICD-10-CM

## 2016-02-21 DIAGNOSIS — R109 Unspecified abdominal pain: Secondary | ICD-10-CM

## 2016-02-21 MED ORDER — OXYCODONE-ACETAMINOPHEN 5-325 MG PO TABS: 2.0000 | ORAL_TABLET | Freq: Four times a day (QID) | ORAL | 0 refills | Status: DC | PRN

## 2016-02-21 NOTE — Discharge Instructions (Signed)

## 2016-02-23 ENCOUNTER — Inpatient Hospital Stay (HOSPITAL_COMMUNITY)
Admission: AD | Admit: 2016-02-23 | Discharge: 2016-02-23 | Disposition: A | Discharge status: Home / Self Care | Payer: Medicaid Other | Source: Ambulatory Visit | Attending: Obstetrics and Gynecology | Admitting: Obstetrics and Gynecology

## 2016-02-23 ENCOUNTER — Inpatient Hospital Stay (HOSPITAL_COMMUNITY): Payer: Medicaid Other

## 2016-02-23 ENCOUNTER — Encounter (HOSPITAL_COMMUNITY): Payer: Self-pay

## 2016-02-23 ENCOUNTER — Other Ambulatory Visit: Payer: Medicaid Other

## 2016-02-23 DIAGNOSIS — O26899 Other specified pregnancy related conditions, unspecified trimester: Secondary | ICD-10-CM

## 2016-02-23 DIAGNOSIS — O26891 Other specified pregnancy related conditions, first trimester: Secondary | ICD-10-CM | POA: Insufficient documentation

## 2016-02-23 DIAGNOSIS — K5900 Constipation, unspecified: Secondary | ICD-10-CM | POA: Insufficient documentation

## 2016-02-23 DIAGNOSIS — K5901 Slow transit constipation: Secondary | ICD-10-CM | POA: Diagnosis not present

## 2016-02-23 DIAGNOSIS — Z3A01 Less than 8 weeks gestation of pregnancy: Secondary | ICD-10-CM | POA: Insufficient documentation

## 2016-02-23 DIAGNOSIS — R102 Pelvic and perineal pain: Secondary | ICD-10-CM | POA: Diagnosis not present

## 2016-02-23 DIAGNOSIS — O9989 Other specified diseases and conditions complicating pregnancy, childbirth and the puerperium: Secondary | ICD-10-CM | POA: Diagnosis not present

## 2016-02-23 DIAGNOSIS — O3680X0 Pregnancy with inconclusive fetal viability, not applicable or unspecified: Secondary | ICD-10-CM

## 2016-02-23 DIAGNOSIS — R109 Unspecified abdominal pain: Secondary | ICD-10-CM

## 2016-02-23 LAB — URINE MICROSCOPIC-ADD ON

## 2016-02-23 LAB — URINALYSIS, ROUTINE W REFLEX MICROSCOPIC
Bilirubin Urine: NEGATIVE
Glucose, UA: NEGATIVE mg/dL
Ketones, ur: NEGATIVE mg/dL
Nitrite: NEGATIVE
Protein, ur: NEGATIVE mg/dL
Specific Gravity, Urine: 1.02 (ref 1.005–1.030)
pH: 6 (ref 5.0–8.0)

## 2016-02-23 LAB — HCG, QUANTITATIVE, PREGNANCY: hCG, Beta Chain, Quant, S: 5285 m[IU]/mL — ABNORMAL HIGH (ref <5)

## 2016-02-23 MED ORDER — DOCUSATE SODIUM 250 MG PO CAPS: 250.0000 mg | ORAL_CAPSULE | Freq: Every day | ORAL | 0 refills | Status: DC

## 2016-02-23 MED ORDER — POLYETHYLENE GLYCOL 3350 17 GM/SCOOP PO POWD: ORAL | 0 refills | Status: DC

## 2016-02-23 NOTE — Discharge Instructions (Signed)
High-Fiber Diet Fiber, also called dietary fiber, is a type of carbohydrate found in fruits, vegetables, whole grains, and beans. A high-fiber diet can have many health benefits. Your health care provider may recommend a high-fiber diet to help:  Prevent constipation. Fiber can make your bowel movements more regular.  Lower your cholesterol.  Relieve hemorrhoids, uncomplicated diverticulosis, or irritable bowel syndrome.  Prevent overeating as part of a weight-loss plan.  Prevent heart disease, type 2 diabetes, and certain cancers. WHAT IS MY PLAN? The recommended daily intake of fiber includes:  38 grams for men under age 69.  28 grams for men over age 42.  16 grams for women under age 71.  86 grams for women over age 75. You can get the recommended daily intake of dietary fiber by eating a variety of fruits, vegetables, grains, and beans. Your health care provider may also recommend a fiber supplement if it is not possible to get enough fiber through your diet. WHAT DO I NEED TO KNOW ABOUT A HIGH-FIBER DIET?  Fiber supplements have not been widely studied for their effectiveness, so it is better to get fiber through food sources.  Always check the fiber content on thenutrition facts label of any prepackaged food. Look for foods that contain at least 5 grams of fiber per serving.  Ask your dietitian if you have questions about specific foods that are related to your condition, especially if those foods are not listed in the following section.  Increase your daily fiber consumption gradually. Increasing your intake of dietary fiber too quickly may cause bloating, cramping, or gas.  Drink plenty of water. Water helps you to digest fiber. WHAT FOODS CAN I EAT? Grains Whole-grain breads. Multigrain cereal. Oats and oatmeal. Brown rice. Barley. Bulgur wheat. [place]. Bran muffins. Popcorn. Rye wafer crackers. Vegetables Sweet potatoes. Spinach. Kale. Artichokes. Cabbage. Broccoli.  Green peas. Carrots. Squash. Fruits Berries. Pears. Apples. Oranges. Avocados. Prunes and raisins. Dried figs. Meats and Other Protein Sources Navy, kidney, pinto, and soy beans. Split peas. Lentils. Nuts and seeds. Dairy Fiber-fortified yogurt. Beverages Fiber-fortified soy milk. Fiber-fortified orange juice. Other Fiber bars. The items listed above may not be a complete list of recommended foods or beverages. Contact your dietitian for more options. WHAT FOODS ARE NOT RECOMMENDED? Grains White bread. Pasta made with refined flour. White rice. Vegetables Fried potatoes. Canned vegetables. Well-cooked vegetables.  Fruits Fruit juice. Cooked, strained fruit. Meats and Other Protein Sources Fatty cuts of meat. Fried Sales executive or fried fish. Dairy Milk. Yogurt. Cream cheese. Sour cream. Beverages Soft drinks. Other Cakes and pastries. Butter and oils. The items listed above may not be a complete list of foods and beverages to avoid. Contact your dietitian for more information. WHAT ARE SOME TIPS FOR INCLUDING HIGH-FIBER FOODS IN MY DIET?  Eat a wide variety of high-fiber foods.  Make sure that half of all grains consumed each day are whole grains.  Replace breads and cereals made from refined flour or white flour with whole-grain breads and cereals.  Replace white rice with brown rice, bulgur wheat, or millet.  Start the day with a breakfast that is high in fiber, such as a cereal that contains at least 5 grams of fiber per serving.  Use beans in place of meat in soups, salads, or pasta.  Eat high-fiber snacks, such as berries, raw vegetables, nuts, or popcorn.   This information is not intended to replace advice given to you by your health care provider. Make sure you discuss  any questions you have with your health care provider.   Document Released: 07/04/2005 Document Revised: 07/25/2014 Document Reviewed: 12/17/2013 Elsevier Interactive Patient Education 2016 [employer]. Constipation, Adult Constipation is when a person:  Poops (has a bowel movement) less than 3 times a week.  Has a hard time pooping.  Has poop that is dry, hard, or bigger than normal. HOME CARE   Eat foods with a lot of fiber in them. This includes fruits, vegetables, beans, and whole grains such as brown rice.  Avoid fatty foods and foods with a lot of sugar. This includes french fries, hamburgers, cookies, candy, and soda.  If you are not getting enough fiber from food, take products with added fiber in them (supplements).  Drink enough fluid to keep your pee (urine) clear or pale yellow.  Exercise on a regular basis, or as told by your doctor.  Go to the restroom when you feel like you need to poop. Do not hold it.  Only take medicine as told by your doctor. Do not take medicines that help you poop (laxatives) without talking to your doctor first. GET HELP RIGHT AWAY IF:   You have bright red blood in your poop (stool).  Your constipation lasts more than 4 days or gets worse.  You have belly (abdominal) or butt (rectal) pain.  You have thin poop (as thin as a pencil).  You lose weight, and it cannot be explained. MAKE SURE YOU:   Understand these instructions.  Will watch your condition.  Will get help right away if you are not doing well or get worse.   This information is not intended to replace advice given to you by your health care provider. Make sure you discuss any questions you have with your health care provider.   Document Released: 12/21/2007 Document Revised: 07/25/2014 Document Reviewed: 04/15/2013 Elsevier Interactive Patient Education 2016 Elsevier Inc. Interstitial Cystitis Interstitial cystitis is a condition that causes inflammation of the bladder. The bladder is a hollow organ in the lower part of your abdomen. It stores urine after the urine is made by your kidneys. With interstitial cystitis, you may have pain in the bladder area. You may  also have a frequent and urgent need to urinate. The severity of interstitial cystitis can vary from person to person. You may have flare-ups of the condition, and then it may go away for a while. For many people who have this condition, it becomes a long-term problem. CAUSES The cause of this condition is not known. RISK FACTORS This condition is more likely to develop in women. SYMPTOMS Symptoms of interstitial cystitis vary, and they can change over time. Symptoms may include:  Discomfort or pain in the bladder area. This can range from mild to severe. The pain may change in intensity as the bladder fills with urine or as it empties.  Pelvic pain.  An urgent need to urinate.  Frequent urination.  Pain during sexual intercourse.  Pinpoint bleeding on the bladder wall. For women, the symptoms often get worse during menstruation. DIAGNOSIS This condition is diagnosed by evaluating your symptoms and ruling out other causes. A physical exam will be done. Various tests may be done to rule out other conditions. Common tests include:  Urine tests.  Cystoscopy. In this test, a tool that is like a very thin telescope is used to look into your bladder.  Biopsy. This involves taking a sample of tissue from the bladder wall to be examined under a microscope. TREATMENT  There is no cure for interstitial cystitis, but treatment methods are available to control your symptoms. Work closely with your health care provider to find the treatments that will be most effective for you. Treatment options may include:  Medicines to relieve pain and to help reduce the number of times that you feel the need to urinate.  Bladder training. This involves learning ways to control when you urinate, such as:  Urinating at scheduled times.  Training yourself to delay urination.  Doing exercises (Kegel exercises) to strengthen the muscles that control urine flow.  Lifestyle changes, such as changing your  diet or taking steps to control stress.  Use of a device that provides electrical stimulation in order to reduce pain.  A procedure that stretches your bladder by filling it with air or fluid.  Surgery. This is rare. It is only done for extreme cases if other treatments do not help. HOME CARE INSTRUCTIONS  Take medicines only as directed by your health care provider.  Use bladder training techniques as directed.  Keep a bladder diary to find out which foods, liquids, or activities make your symptoms worse.  Use your bladder diary to schedule bathroom trips. If you are away from home, plan to be near a bathroom at each of your scheduled times.  Make sure you urinate just before you leave the house and just before you go to bed.  Do Kegel exercises as directed by your health care provider.  Do not drink alcohol.  Do not use any tobacco products, including cigarettes, chewing tobacco, or electronic cigarettes. If you need help quitting, ask your health care provider.  Make dietary changes as directed by your health care provider. You may need to avoid spicy foods and foods that contain a high amount of potassium.  Limit your drinking of beverages that stimulate urination. These include soda, coffee, and tea.  Keep all follow-up visits as directed by your health care provider. This is important. SEEK MEDICAL CARE IF:  Your symptoms do not get better after treatment.  Your pain and discomfort are getting worse.  You have more frequent urges to urinate.  You have a fever. SEEK IMMEDIATE MEDICAL CARE IF:  You are not able to control your bladder at all.   This information is not intended to replace advice given to you by your health care provider. Make sure you discuss any questions you have with your health care provider.   Document Released: 03/04/2004 Document Revised: 07/25/2014 Document Reviewed: 03/11/2014 Elsevier Interactive Patient Education [employer].

## 2016-02-23 NOTE — MAU Provider Note (Signed)
History     CSN: [id number]  Arrival date and time: 02/23/16 1326   None     Chief Complaint  Patient presents with  . Abdominal Pain   HPI Kristy Hunt is a 30 y.o. G4P3 at [redacted]w[redacted]d who presents to MAU today with complaint of pelvic pressure. The patient has been seen numerous times for this pregnancy over the last week. She has had multiple [place] and repeat hCG results. Quant hCG continues to rise  Appropriately even today, however patient reports more pelvic pressure. She denies vaginal bleeding, abnormal discharge or fever. The patient states that symptoms of pressure increase significantly when emptying of the bladder and upon standing. 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 She has not seen Urology recently, but her PCP has treated her numerous times for presumed UTI over the last several months without change or resolution of symptoms.   OB History    Gravida Para Term Preterm AB Living   4 3       3    SAB TAB Ectopic Multiple Live Births                  Past Medical History:  Diagnosis Date  . Bigeminal pulse   . Complication of anesthesia     Past Surgical History:  Procedure Laterality Date  . CHOLECYSTECTOMY    . infection in foot removed     . TONSILLECTOMY      No family history on file.  Social History  Substance Use Topics  . Smoking status: Never Smoker  . Smokeless tobacco: Never Used  . Alcohol use No    Allergies:  Allergies  Allergen Reactions  . Dilaudid [Hydromorphone Hcl] Hives and Shortness Of Breath  . Morphine And Related Hives and Shortness Of Breath    Pt states she can tolerate Percocet    Prescriptions Prior to Admission  Medication Sig Dispense Refill Last Dose  . gabapentin (NEURONTIN) 300 MG capsule Take 300 mg by mouth 2 (two) times daily.   02/23/2016 at Unknown time  . oxyCODONE-acetaminophen (PERCOCET/ROXICET) 5-325 MG tablet  Take 2 tablets by mouth every 6 (six) hours as needed for severe pain. 10 tablet 0 02/23/2016 at Unknown time    Review of Systems  Constitutional: Negative for chills and fever.  Gastrointestinal: Positive for abdominal pain and constipation. Negative for diarrhea, nausea and vomiting.  Genitourinary: Positive for dysuria, frequency and hematuria. Negative for flank pain and urgency.       Neg - vaginal bleeding, discharge + pelvic pressure   Physical Exam   Blood pressure 118/73, pulse 80, temperature 97.9 F (36.6 C), resp. rate 18, last menstrual period 01/11/2016.  Physical Exam  Nursing note and vitals reviewed. Constitutional: She is oriented to person, place, and time. She appears well-developed and well-nourished. No distress.  HENT:  Head: Normocephalic and atraumatic.  Cardiovascular: Normal rate.   Respiratory: Effort normal.  GI: Soft. She exhibits no distension and no mass. There is no tenderness. There is no rebound and no guarding.  Neurological: She is alert and oriented to person, place, and time.  Skin: Skin is warm and dry. No erythema.  Psychiatric: She has a normal mood and affect.    Results for orders placed or performed during the hospital encounter of 02/23/16 (from the past 24 hour(s))  Urinalysis, Routine w reflex microscopic (not at [facility])     Status: Abnormal  Collection Time: 02/23/16  2:00 PM  Result Value Ref Range   Color, Urine YELLOW YELLOW   APPearance CLEAR CLEAR   Specific Gravity, Urine 1.020 1.005 - 1.030   pH 6.0 5.0 - 8.0   Glucose, UA NEGATIVE NEGATIVE mg/dL   Hgb urine dipstick MODERATE (A) NEGATIVE   Bilirubin Urine NEGATIVE NEGATIVE   Ketones, ur NEGATIVE NEGATIVE mg/dL   Protein, ur NEGATIVE NEGATIVE mg/dL   Nitrite NEGATIVE NEGATIVE   Leukocytes, UA SMALL (A) NEGATIVE  Urine microscopic-add on     Status: Abnormal   Collection Time: 02/23/16  2:00 PM  Result Value Ref Range   Squamous Epithelial / LPF 0-5 (A) NONE SEEN    WBC, UA 6-30 0 - 5 WBC/hpf   RBC / HPF 6-30 0 - 5 RBC/hpf   Bacteria, UA RARE (A) NONE SEEN   Results for IXCHEL, MCGUYER (MRN [id number]) as of 02/23/2016 14:04  Ref. Range 02/20/2016 20:56 02/20/2016 21:30 02/20/2016 22:02 02/20/2016 23:15 02/23/2016 11:05  HCG, Beta Chain, Quant, S Latest Ref Range: <5 mIU/mL 2,321 (H)    5,285 (H)   MAU Course  Procedures None  MDM Quant hCG was 2321 on 02/20/16 and repeated today in [place], increased to 5285 Recent Urine culture - negative, UA today, also unlikely to represent infection. Patient states history of issues with bladder and recurrent infections, I am more concerned about possible interstitial cystitis causing a lot of the pelvic pressure and urinary symptoms. Will refer to  Urology for further evaluation and management.  In review of recent US and lab values from previous visits, patient had [place] report of possible abnormal IUGS with very low HCG levels, which seems inconsistent and possibly inaccurate. For this reason the fact that IUGS was no longer seen on [place] on 02/20/16 is not as worrisome for complete AB, especially since a "normal" appearing IUGS was noted on [place] today.  Discussed patient with Dr. Elly Modena who has reviewed [place] images and patient's history. Advised 48 hour follow-up to ensure continued rise in hCG, if appropriate schedule [place] for 1 week to confirm viability. Also advised patient should attempt to discontinue Percocet in order to accurately assess pain and reduce constipation.   Assessment and Plan  A: Pregnancy of unknown location Pelvic pain Constipation  P: Discharge home Rx for Colace and Miralax sent to patient's pharmacy Ectopic precautions discussed Patient advised to increased PO hydration and fiber intake Patient advised to follow-up with [place] on Thursday for repeat hCG, if continued rise will need [place] in 1 week to confirm viabilty Patient may return to MAU as needed or if her condition were to change or worsen   Luvenia Redden, PA-C  02/23/2016, 5:25 PM

## 2016-02-23 NOTE — MAU Note (Signed)
Pt presents to MAU with complaints of pelvic pressure. Pt states that she went to the clinic for a follow up QUANT. Today. Was told to come to be evaluated for possible miscarriage

## 2016-02-23 NOTE — Progress Notes (Signed)
Patient presented to the office today for a repeat Quant. Patient is complaining of severe cramping but no bleeding at this time. Patient level at this time has increase however nothing was seen on her previous U/S I have spoken with the House coverage who recommended that patient go back to MAU since she is having painful cramps.  Patient verbalizes understanding and thinks this would be a good idea since it was discuss in MAU earlier about speeding the process of having a miscarriage up.

## 2016-02-25 ENCOUNTER — Other Ambulatory Visit: Payer: Medicaid Other

## 2016-03-02 ENCOUNTER — Inpatient Hospital Stay (HOSPITAL_COMMUNITY)
Admission: AD | Admit: 2016-03-02 | Discharge: 2016-03-02 | Disposition: A | Discharge status: Home / Self Care | Payer: Medicaid Other | Source: Ambulatory Visit | Attending: Family Medicine | Admitting: Family Medicine

## 2016-03-02 ENCOUNTER — Inpatient Hospital Stay (HOSPITAL_COMMUNITY): Payer: Medicaid Other

## 2016-03-02 ENCOUNTER — Encounter (HOSPITAL_COMMUNITY): Payer: Self-pay | Admitting: *Deleted

## 2016-03-02 ENCOUNTER — Other Ambulatory Visit: Payer: Medicaid Other

## 2016-03-02 DIAGNOSIS — Z3A01 Less than 8 weeks gestation of pregnancy: Secondary | ICD-10-CM | POA: Insufficient documentation

## 2016-03-02 DIAGNOSIS — O26891 Other specified pregnancy related conditions, first trimester: Secondary | ICD-10-CM | POA: Diagnosis not present

## 2016-03-02 DIAGNOSIS — O26899 Other specified pregnancy related conditions, unspecified trimester: Secondary | ICD-10-CM

## 2016-03-02 DIAGNOSIS — O039 Complete or unspecified spontaneous abortion without complication: Secondary | ICD-10-CM | POA: Insufficient documentation

## 2016-03-02 DIAGNOSIS — R102 Pelvic and perineal pain: Secondary | ICD-10-CM | POA: Diagnosis present

## 2016-03-02 DIAGNOSIS — O034 Incomplete spontaneous abortion without complication: Secondary | ICD-10-CM

## 2016-03-02 DIAGNOSIS — Z885 Allergy status to narcotic agent status: Secondary | ICD-10-CM | POA: Diagnosis not present

## 2016-03-02 LAB — URINALYSIS, ROUTINE W REFLEX MICROSCOPIC
Bilirubin Urine: NEGATIVE
Glucose, UA: NEGATIVE mg/dL
Ketones, ur: NEGATIVE mg/dL
Leukocytes, UA: NEGATIVE
Nitrite: NEGATIVE
Protein, ur: 30 mg/dL — AB
Specific Gravity, Urine: 1.025 (ref 1.005–1.030)
pH: 6 (ref 5.0–8.0)

## 2016-03-02 LAB — URINE MICROSCOPIC-ADD ON

## 2016-03-02 LAB — HCG, QUANTITATIVE, PREGNANCY: hCG, Beta Chain, Quant, S: 13196 m[IU]/mL — ABNORMAL HIGH (ref <5)

## 2016-03-02 MED ORDER — OXYCODONE-ACETAMINOPHEN 5-325 MG PO TABS
1.0000 | ORAL_TABLET | ORAL | Status: AC
  Administered 2016-03-02: 1 via ORAL
  Filled 2016-03-02: qty 1

## 2016-03-02 MED ORDER — MISOPROSTOL 200 MCG PO TABS: 800.0000 ug | ORAL_TABLET | Freq: Once | ORAL | 1 refills | Status: DC

## 2016-03-02 MED ORDER — ONDANSETRON HCL 4 MG PO TABS: 4.0000 mg | ORAL_TABLET | Freq: Three times a day (TID) | ORAL | 0 refills | Status: DC | PRN

## 2016-03-02 NOTE — Discharge Instructions (Signed)
Miscarriage  A miscarriage is the sudden loss of an unborn baby (fetus) before the 20th week of pregnancy. Most miscarriages happen in the first 3 months of pregnancy. Sometimes, it happens before a woman even knows she is pregnant. A miscarriage is also called a "spontaneous miscarriage" or "early pregnancy loss." Having a miscarriage can be an emotional experience. Talk with your caregiver about any questions you may have about miscarrying, the grieving process, and your future pregnancy plans.  CAUSES    Problems with the fetal chromosomes that make it impossible for the baby to develop normally. Problems with the baby's genes or chromosomes are most often the result of errors that occur, by chance, as the embryo divides and grows. The problems are not inherited from the parents.   Infection of the cervix or uterus.    Hormone problems.    Problems with the cervix, such as having an incompetent cervix. This is when the tissue in the cervix is not strong enough to hold the pregnancy.    Problems with the uterus, such as an abnormally shaped uterus, uterine fibroids, or congenital abnormalities.    Certain medical conditions.    Smoking, drinking alcohol, or taking illegal drugs.    Trauma.   Often, the cause of a miscarriage is unknown.   SYMPTOMS    Vaginal bleeding or spotting, with or without cramps or pain.   Pain or cramping in the abdomen or lower back.   Passing fluid, tissue, or blood clots from the vagina.  DIAGNOSIS   Your caregiver will perform a physical exam. You may also have an ultrasound to confirm the miscarriage. Blood or urine tests may also be ordered.  TREATMENT    Sometimes, treatment is not necessary if you naturally pass all the fetal tissue that was in the uterus. If some of the fetus or placenta remains in the body (incomplete miscarriage), tissue left behind may become infected and must be removed. Usually, a dilation and curettage (D and C) procedure is performed.  During a D and C procedure, the cervix is widened (dilated) and any remaining fetal or placental tissue is gently removed from the uterus.   Antibiotic medicines are prescribed if there is an infection. Other medicines may be given to reduce the size of the uterus (contract) if there is a lot of bleeding.   If you have Rh negative blood and your baby was Rh positive, you will need a Rh immunoglobulin shot. This shot will protect any future baby from having Rh blood problems in future pregnancies.  HOME CARE INSTRUCTIONS    Your caregiver may order bed rest or may allow you to continue light activity. Resume activity as directed by your caregiver.   Have someone help with home and family responsibilities during this time.    Keep track of the number of sanitary pads you use each day and how soaked (saturated) they are. Write down this information.    Do not use tampons. Do not douche or have sexual intercourse until approved by your caregiver.    Only take over-the-counter or prescription medicines for pain or discomfort as directed by your caregiver.    Do not take aspirin. Aspirin can cause bleeding.    Keep all follow-up appointments with your caregiver.    If you or your partner have problems with grieving, talk to your caregiver or seek counseling to help cope with the pregnancy loss. Allow enough time to grieve before trying to get pregnant again.     SEEK IMMEDIATE MEDICAL CARE IF:    You have severe cramps or pain in your back or abdomen.   You have a fever.   You pass large blood clots (walnut-sized or larger) ortissue from your vagina. Save any tissue for your caregiver to inspect.    Your bleeding increases.    You have a thick, bad-smelling vaginal discharge.   You become lightheaded, weak, or you faint.    You have chills.   MAKE SURE YOU:   Understand these instructions.   Will watch your condition.   Will get help right away if you are not doing well or get worse.     This  information is not intended to replace advice given to you by your health care provider. Make sure you discuss any questions you have with your health care provider.     Document Released: 12/28/2000 Document Revised: 10/29/2012 Document Reviewed: 08/23/2011  Elsevier Interactive Patient Education 2016 [employer]

## 2016-03-02 NOTE — MAU Note (Signed)
Not having abdominal pain but having more vaginal pressure; has been in bed with pain and unable to care for her 3 children; denies any vaginal discharge or vaginal bleeding;

## 2016-03-02 NOTE — MAU Note (Cosign Needed)
  History     CSN: [id number]  Arrival date and time: 03/02/16 1028   None     Chief Complaint  Patient presents with  . Abdominal Pain   HPI  Kristy Hunt is a 30yo G4P3 at [redacted]w[redacted]d presenting with 3 weeks of constant 9/10 lower abdominal pain and vaginal pressure. She has been treated for potential UTI but reports that the antibiotics did not help with the pain. She has been unable to go to work. She denies any vaginal discharge or bleeding. She states that it hurts to pee. A transvaginal ultrasound on 08/08 showed no definitive localization of pregnancy and a follow-up scan was recommended.    Past Medical History:  Diagnosis Date  . Bigeminal pulse   . Complication of anesthesia     Past Surgical History:  Procedure Laterality Date  . CHOLECYSTECTOMY    . FOOT SURGERY    . infection in foot removed     . TONSILLECTOMY      Family History  Problem Relation Age of Onset  . Endometriosis Mother   . Diabetes Maternal Grandfather   . Diabetes Paternal Grandmother     Social History  Substance Use Topics  . Smoking status: Never Smoker  . Smokeless tobacco: Never Used  . Alcohol use No    Allergies:  Allergies  Allergen Reactions  . Dilaudid [Hydromorphone Hcl] Hives and Shortness Of Breath  . Morphine And Related Hives and Shortness Of Breath    Pt states she can tolerate Percocet    Prescriptions Prior to Admission  Medication Sig Dispense Refill Last Dose  . docusate sodium (COLACE) 250 MG capsule Take 1 capsule (250 mg total) by mouth daily. 10 capsule 0   . gabapentin (NEURONTIN) 300 MG capsule Take 300 mg by mouth 2 (two) times daily.   02/23/2016 at Unknown time  . polyethylene glycol powder (MIRALAX) powder Take 1 capful daily until normal BM regimen resumes, then PRN 255 g 0     ROS Physical Exam   Blood pressure (!) 108/53, pulse (!) 45, temperature 98.1 F (36.7 C), temperature source Oral, resp. rate 16, last menstrual period  01/11/2016.  Physical Exam  MAU Course  Procedures  MDM   Assessment and Plan  Kristy Hunt is a 30yo G4P3 at [redacted]w[redacted]d presenting with 3 weeks of constant 9/10 lower abdominal pain and vaginal pressure. One concerning possibility is an ectopic pregnancy. To check for this, order quantitative hCG and a transvaginal ultrasound. Also check for any signs of a urinary tract infection with a urinalysis and culture. Check for any sides of a vaginal infection with a wet prep.    Demetrios Isaacs 03/02/2016, 11:36 AM

## 2016-03-02 NOTE — MAU Note (Signed)
Pt has been into ED several times with pain complaints with this pregnancy; hcg is rising but pt is having more vaginal pressure; states that when she voids  " it feels like something is pushing out"; pt was to have a scheduled u/s sometime this week;

## 2016-03-02 NOTE — MAU Provider Note (Signed)
Chief Complaint: Abdominal Pain    SUBJECTIVE HPI: Kristy Hunt is a 30 y.o. G4P3 at [redacted]w[redacted]d who presents to Maternity Admissions reporting pelvic pressure and pelvic pain.  The patient has been seen numerous times for this pregnancy over the last couple weeks for the same complaints. She has had multiple [place] and repeat hCG results. Quant hCG continues to rise appropriately until today (over course of 8 days, rose from 5,285 to 13,196), and an [place] that does not show a fetal pole. Patient reports continued pelvic pressure/pain. Patient is not able to get out of bed or walk due to the pain. Tylenol does not help. Percocet's do not help. Ambulating makes pain worse, nothing makes pain better.   She denies vaginal bleeding, abnormal discharge or fever. The patient states that symptoms of pressure increase significantly when emptying of the bladder and upon standing. She states a significant medical history of recurrent UTIs and urologic issues as a teenager. She has not seen Urology recently, but her PCP has treated her numerous times for presumed UTI over the last several months without change or resolution of symptoms. Patient unable to get out of bed due to pain. Pain is intravaginally, not on abdomen.    Past Medical History:  Diagnosis Date  . Bigeminal pulse   . Complication of anesthesia    OB History  Gravida Para Term Preterm AB Living  4 3       3   SAB TAB Ectopic Multiple Live Births               # Outcome Date GA Lbr Len/2nd Weight Sex Delivery Anes PTL Lv  4 Current           3 Para           2 Para           1 Para              Past Surgical History:  Procedure Laterality Date  . CHOLECYSTECTOMY    . FOOT SURGERY    . infection in foot removed     . TONSILLECTOMY     Social History   Social History  . Marital status: Legally Separated    Spouse name: N/A  . Number of children: N/A  . Years of education: N/A   Occupational History  . Not on file.    Social History Main Topics  . Smoking status: Never Smoker  . Smokeless tobacco: Never Used  . Alcohol use No  . Drug use: No  . Sexual activity: Yes    Birth control/ protection: None   Other Topics Concern  . Not on file   Social History Narrative  . No narrative on file   No current facility-administered medications on file prior to encounter.    Current Outpatient Prescriptions on File Prior to Encounter  Medication Sig Dispense Refill  . docusate sodium (COLACE) 250 MG capsule Take 1 capsule (250 mg total) by mouth daily. 10 capsule 0  . gabapentin (NEURONTIN) 300 MG capsule Take 300 mg by mouth 2 (two) times daily.    . polyethylene glycol powder (MIRALAX) powder Take 1 capful daily until normal BM regimen resumes, then PRN 255 g 0   Allergies  Allergen Reactions  . Dilaudid [Hydromorphone Hcl] Hives and Shortness Of Breath  . Morphine And Related Hives and Shortness Of Breath    Pt states she can tolerate Percocet    I have reviewed the past Medical Hx,  Surgical Hx, Social Hx, Allergies and Medications.   Review of Systems  REVIEW OF SYSTEMS  OPHTHALMIC: negative for - blurry vision, decreased vision, double vision, photophobia or scotomata RESPIRATORY: no cough, shortness of breath, or wheezing CARDIOVASCULAR: no chest pain or dyspnea on exertion GASTROINTESTINAL: no abdominal pain, change in bowel habits, or black or bloody stools negative for - epigastric or RUQ pain GENITO-URINARY: no dysuria, trouble voiding, or hematuria negative for - genital discharge, vulvar/vaginal symptoms or vaginal bleeding MUSKULOSKELETAL: negative for - gait disturbance or swelling in ankle - bilateral, foot - bilateral and leg - bilateral NEUROLOGICAL: negative for - dizziness, gait disturbance, headaches, numbness/tingling or visual changes DERMATOLOGICAL: negative OBSTETRICAL: No vaginal bleeding, no leaking fluid, no contractions. Good fetal movement.   OBJECTIVE Patient  Vitals for the past 24 hrs:  BP Temp Temp src Pulse Resp  03/02/16 1100 (!) 108/53 98.1 F (36.7 C) Oral (!) 45 16   Constitutional: Well-developed, well-nourished female in no acute distress.  Cardiovascular: normal rate Respiratory: normal rate and effort.  GI: Abd soft, non-tender, gravid appropriate for gestational age. Pos BS x 4. MS: Extremities nontender, no edema, normal ROM Neurologic: Alert and oriented x 4.  GU: Neg CVAT.   LAB RESULTS Results for orders placed or performed during the hospital encounter of 03/02/16 (from the past 24 hour(s))  Urinalysis, Routine w reflex microscopic (not at [facility])     Status: Abnormal   Collection Time: 03/02/16 10:45 AM  Result Value Ref Range   Color, Urine YELLOW YELLOW   APPearance CLEAR CLEAR   Specific Gravity, Urine 1.025 1.005 - 1.030   pH 6.0 5.0 - 8.0   Glucose, UA NEGATIVE NEGATIVE mg/dL   Hgb urine dipstick MODERATE (A) NEGATIVE   Bilirubin Urine NEGATIVE NEGATIVE   Ketones, ur NEGATIVE NEGATIVE mg/dL   Protein, ur 30 (A) NEGATIVE mg/dL   Nitrite NEGATIVE NEGATIVE   Leukocytes, UA NEGATIVE NEGATIVE  Urine microscopic-add on     Status: Abnormal   Collection Time: 03/02/16 10:45 AM  Result Value Ref Range   Squamous Epithelial / LPF 0-5 (A) NONE SEEN   WBC, UA 0-5 0 - 5 WBC/hpf   RBC / HPF 0-5 0 - 5 RBC/hpf   Bacteria, UA RARE (A) NONE SEEN    IMAGING US Ob Comp Less 14 Wks  Result Date: 02/17/2016 CLINICAL DATA:  30 year old female with positive HCG levels presenting with pelvic pain. EXAM: OBSTETRIC <14 WK [place] AND TRANSVAGINAL OB US TECHNIQUE: Both transabdominal and transvaginal ultrasound examinations were performed for complete evaluation of the gestation as well as the maternal uterus, adnexal regions, and pelvic cul-de-sac. Transvaginal technique was performed to assess early pregnancy. COMPARISON:  None. FINDINGS: The uterus is anteverted and slightly heterogeneous. It measures 8.7 x 5.3 x 6.9 cm. The endometrium  is thickened and echogenic. There is a small fluid collection within the endometrial canal which may represent a gestational sac with irregular walls. No yolk sac identified. A 7 mm echogenic structure within this fluid collection may represent fetal pole or debris. If this echogenic structure is a true fetal pole the estimated gestational age based on crown-rump length of 7 mm is 6 weeks, 4 days with an estimated date of delivery on 10/07/2016. Cine images demonstrate flickering activity within this echogenic structure which may represent early fetal cardiac activity. Correlation with serial HCG levels and follow-up with ultrasound recommended. This was subchorionic hemorrhage. The maternal ovaries appear unremarkable. The right ovary measures 4.8 x 2.5 x  3.1 cm and the left ovary measures 4.1 x 1.6 x 2.5 cm. There is a 1.9 x 1.8 x 1.7 cm right ovarian complex appearing cyst. No significant free fluid within the pelvis. IMPRESSION: Probable irregular gestational sac containing a fetal pole with an estimated gestational age of [redacted] weeks, 4 days. Faint flickering motion on the cine images may represent early cardiac activity. Follow-up recommended. Electronically Signed   By: Anner Crete M.D.   On: 02/17/2016 00:37   US Ob Transvaginal  Result Date: 02/23/2016 CLINICAL DATA:  30 year old pregnant female presents with several weeks of pelvic pressure and spotting. Quantitative beta HCG 5,285, compared to 2,321 when measured 3 days prior. EDC by LMP: 10/17/2016, projecting to an expected gestational age of [redacted] weeks 1 day. EXAM: TRANSVAGINAL OB ULTRASOUND TECHNIQUE: Transvaginal ultrasound was performed for complete evaluation of the gestation as well as the maternal uterus, adnexal regions, and pelvic cul-de-sac. COMPARISON:  02/20/2016 obstetric scan. FINDINGS: The anteverted anteflexed uterus measures 9.9 x 5.3 x 7.6 cm in size. There is concavity of the fundal endometrial contour, which could indicate a  developmental uterine anomaly such as a subseptate uterus or arcuate configuration of the uterus. There are no uterine fibroids or other myometrial abnormalities. There is no definitive intrauterine gestational sac. There is a new irregular sac-like structure within the endometrial cavity measuring 1.0 x 0.3 x 1.1 cm (average diameter 0.8 cm), without internal yolk sac or embryo and without a definitive double gestational sac sign. There is additional complex fluid within the right uterine horn. The right ovary measures 4.4 x 2.7 x 3.9 cm and contains a corpus luteum. The left ovary measures 2.9 x 2.4 x 2.9 cm. No suspicious ovarian or adnexal masses are demonstrated. No abnormal free fluid is seen in the pelvis. IMPRESSION: No definitive localization of the pregnancy on this scan. Irregular 0.8 cm sac-like structure within the endometrial cavity, newly visualized since the 02/20/2016 scan, with no yolk sac or embryo detected, which could represent a pseudo-gestational sac (decidual cyst) or an early gestational sac. No abnormal ovarian or adnexal masses. Sonographic differential diagnosis continues to include an early intrauterine gestation, spontaneous abortion or occult ectopic gestation. Recommend close clinical follow-up and serial serum beta HCG monitoring. A follow-up obstetric scan is recommended in 15-21 days or earlier as clinically warranted. Electronically Signed   By: Ilona Sorrel M.D.   On: 02/23/2016 16:34   US Ob Transvaginal  Result Date: 02/20/2016 CLINICAL DATA:  30 year old female pregnant female presenting with abdominal pain EXAM: TRANSVAGINAL OB ULTRASOUND TECHNIQUE: Transvaginal ultrasound was performed for complete evaluation of the gestation as well as the maternal uterus, adnexal regions, and pelvic cul-de-sac. COMPARISON:  Ultrasound dated 02/18/2016 and 02/16/2016 FINDINGS: The uterus is anteverted and appears homogeneous. 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 No intrauterine pregnancy identified. The previously seen gestational sac or fluid collection is not identified on this study. Small amount of fluid with low level internal echoes noted within the endometrial canal. The maternal ovaries appear unremarkable. The right ovary measures 4.4 x 3.7 x 2.9 cm. The left ovary measures 3.4 x 2.1 x 2.2 cm. A corpus luteum is noted in the right ovary. No significant free fluid identified within pelvis. IMPRESSION: No intrauterine pregnancy identified. The previously seen gestational sac or loculated fluid collection is no longer present on this study. Findings most consistent with spontaneous miscarriage. Correlation with clinical exam and follow-up with serial HCG levels recommended. Unremarkable ovaries. Electronically Signed  By: Anner Crete M.D.   On: 02/20/2016 23:28   US Ob Transvaginal  Result Date: 02/18/2016 CLINICAL DATA:  30 year old female with worsening abdominal pain in the first trimester of pregnancy. Early IUP suspected on ultrasound 2 days ago. Subsequent encounter. Quantitative beta HCG 1,016 today, versus 705 2 days ago. Estimated gestational age by LMP 5 weeks 3 days. EXAM: TRANSVAGINAL OB ULTRASOUND TECHNIQUE: Transvaginal ultrasound was performed for complete evaluation of the gestation as well as the maternal uterus, adnexal regions, and pelvic cul-de-sac. COMPARISON:  02/16/2016 FINDINGS: Intrauterine gestational sac: 3.6 cm elongated fluid collection in the endometrial canal, which may reflect an irregularly shaped gestational sac. This appears more elongated than 2 days ago. Yolk sac:  Not visible Embryo:  Questionably visible on images 39 and 48 Cardiac Activity: None detected Presumed MSD: 17.7  mm   6 w   5  d Presumed CRL:   2.1  mm   5 w 5 d Maternal uterus/adnexae: No subchorionic hemorrhage or pelvic free fluid. Corpus luteum demonstrated in the right ovary which measures 4.5 x 2.9 x 3.5 cm. Normal left ovary which measures  1.8 x 3.0 x 1.7 cm. IMPRESSION: Findings are suspicious but not yet definitive for failed pregnancy. Recommend follow-up US in 10-14 days for definitive diagnosis. This recommendation follows SRU consensus guidelines: Diagnostic Criteria for Nonviable Pregnancy Early in the First Trimester. [place] Med [id number]. Electronically Signed   By: Genevie Ann M.D.   On: 02/18/2016 19:13   US Ob Transvaginal  Result Date: 02/17/2016 CLINICAL DATA:  30 year old female with positive HCG levels presenting with pelvic pain. EXAM: OBSTETRIC <14 WK [place] AND TRANSVAGINAL OB US TECHNIQUE: Both transabdominal and transvaginal ultrasound examinations were performed for complete evaluation of the gestation as well as the maternal uterus, adnexal regions, and pelvic cul-de-sac. Transvaginal technique was performed to assess early pregnancy. COMPARISON:  None. FINDINGS: The uterus is anteverted and slightly heterogeneous. It measures 8.7 x 5.3 x 6.9 cm. The endometrium is thickened and echogenic. There is a small fluid collection within the endometrial canal which may represent a gestational sac with irregular walls. No yolk sac identified. A 7 mm echogenic structure within this fluid collection may represent fetal pole or debris. If this echogenic structure is a true fetal pole the estimated gestational age based on crown-rump length of 7 mm is 6 weeks, 4 days with an estimated date of delivery on 10/07/2016. Cine images demonstrate flickering activity within this echogenic structure which may represent early fetal cardiac activity. Correlation with serial HCG levels and follow-up with ultrasound recommended. This was subchorionic hemorrhage. The maternal ovaries appear unremarkable. The right ovary measures 4.8 x 2.5 x 3.1 cm and the left ovary measures 4.1 x 1.6 x 2.5 cm. There is a 1.9 x 1.8 x 1.7 cm right ovarian complex appearing cyst. No significant free fluid within the pelvis. IMPRESSION: Probable irregular gestational  sac containing a fetal pole with an estimated gestational age of [redacted] weeks, 4 days. Faint flickering motion on the cine images may represent early cardiac activity. Follow-up recommended. Electronically Signed   By: Anner Crete M.D.   On: 02/17/2016 00:37    MAU COURSE Repeat US BHCG Pain control w/ percocet   MDM Plan of care reviewed with patient, including labs and tests ordered and medical treatment. Patient given options of expectant management of miscarriage, medical management, and procedural management. Patient elected to undergo medical management.  Patient states she would like to take  medication starting on Tuesday. Reviewed in detail regarding effects of medication, what to expect, when to seek medical care, follow up plans, and pain management.    ASSESSMENT 1. Inevitable spontaneous abortion   2. Pelvic pain affecting pregnancy     PLAN Discharge home in stable condition. Instructions and precautions given regarding medication, side effects, when to seek medical care, follow up.  Anticipatory guidance given.   Early Intrauterine Pregnancy Failure  _X_  Documented intrauterine pregnancy failure less than or equal to [redacted] weeks gestation  _X_  No serious current illness  _X_  Baseline Hgb greater than or equal to 10g/dl  _X_  Patient has easily accessible transportation to the hospital  _X_  Clear preference  _X_  Practitioner/physician deems patient reliable  _X_  Counseling by practitioner or physician  ___  Patient education by RN  ___  Consent form signed  ___  Rho-Gam given by RN if indicated  ___ Medication dispensed   _X_   Cytotec 800 mcg      X   Intravaginally by patient at home      __   Intravaginally by RN in MAU     __   Rectally by patient at home     __   Rectally by RN in MAU  _X__  Ibuprofen 600 mg 1 tablet by mouth every 6 hours as needed #30  ---> Patient states has Ibuprofen 800mg  tablets at home already  ___   Hydrocodone/acetaminophen 5/325 mg by mouth every 4 to 6 hours as needed  ___  Phenergan 12.5 mg by mouth every 4 hours as needed for nausea            Medication List    STOP taking these medications   docusate sodium 250 MG capsule Commonly known as:  COLACE   polyethylene glycol powder powder Commonly known as:  MIRALAX     TAKE these medications   gabapentin 300 MG capsule Commonly known as:  NEURONTIN Take 300 mg by mouth 2 (two) times daily.   misoprostol 200 MCG tablet Commonly known as:  CYTOTEC Take 4 tablets (800 mcg total) by mouth once. If no passage of tissue in 48 hours, repeat once.   ondansetron 4 MG tablet Commonly known as:  ZOFRAN Take 1 tablet (4 mg total) by mouth every 8 (eight) hours as needed for nausea or vomiting.        [place], [place] 03/02/2016  12:38 PM

## 2016-03-03 ENCOUNTER — Telehealth: Payer: Self-pay | Admitting: *Deleted

## 2016-03-03 ENCOUNTER — Other Ambulatory Visit: Payer: Self-pay | Admitting: General Practice

## 2016-03-03 LAB — CULTURE, OB URINE: Culture: <10000 — AB

## 2016-03-03 MED ORDER — MISOPROSTOL 200 MCG PO TABS: 800.0000 ug | ORAL_TABLET | Freq: Once | ORAL | 1 refills | Status: DC

## 2016-03-03 MED ORDER — ONDANSETRON HCL 4 MG PO TABS: 4.0000 mg | ORAL_TABLET | Freq: Three times a day (TID) | ORAL | 0 refills | Status: DC | PRN

## 2016-03-08 ENCOUNTER — Inpatient Hospital Stay (HOSPITAL_COMMUNITY)
Admission: AD | Admit: 2016-03-08 | Discharge: 2016-03-08 | Disposition: A | Discharge status: Home / Self Care | Payer: Medicaid Other | Source: Ambulatory Visit | Attending: Family Medicine | Admitting: Family Medicine

## 2016-03-08 ENCOUNTER — Encounter (HOSPITAL_COMMUNITY): Payer: Self-pay | Admitting: *Deleted

## 2016-03-08 DIAGNOSIS — Z885 Allergy status to narcotic agent status: Secondary | ICD-10-CM | POA: Diagnosis not present

## 2016-03-08 DIAGNOSIS — R102 Pelvic and perineal pain: Secondary | ICD-10-CM | POA: Diagnosis present

## 2016-03-08 DIAGNOSIS — O021 Missed abortion: Secondary | ICD-10-CM

## 2016-03-08 MED ORDER — OXYCODONE-ACETAMINOPHEN 5-325 MG PO TABS: 1.0000 | ORAL_TABLET | Freq: Four times a day (QID) | ORAL | 0 refills | Status: DC | PRN

## 2016-03-08 NOTE — MAU Note (Signed)
Has been coming here for a few wks now. Needs to have a D& C.  But didn't have anyone to sit with her kids.  Had discussed taking the medication and doing it at home, but she is having the worst anxiety and she just can't do it.  Having the worst pressure, still no bleeding.

## 2016-03-08 NOTE — MAU Note (Signed)
Patient states she was advised to return for F/U. States she did not use cytotec because she is afraid. States she does not recall being told to F/U in clinic.

## 2016-03-08 NOTE — MAU Provider Note (Signed)
Chief Complaint: Pelvic Pain   First Provider Initiated Contact with Patient 03/08/16 1834        SUBJECTIVE HPI HPI: Kristy Hunt is a 30 y.o. G4P3 at [redacted]w[redacted]d by LMP who presents to maternity admissions reporting continued pain and pressure.  No bleeding.  Decided she cannot take Cytotec due to pain and no one to watch her 3 kids while she is cramping and bleeding with SAB.  Really wants a D&C.  Marland Kitchen She denies vaginal bleeding, vaginal itching/burning, urinary symptoms, h/a, dizziness, n/v, or fever/chills.    RN Note: Has been coming here for a few wks now. Needs to have a D& C.  But didn't have anyone to sit with her kids.  Had discussed taking the medication and doing it at home, but she is having the worst anxiety and she just can't do it.  Having the worst pressure, still no bleeding   Past Medical History:  Diagnosis Date  . Bigeminal pulse   . Complication of anesthesia    Past Surgical History:  Procedure Laterality Date  . CHOLECYSTECTOMY    . FOOT SURGERY    . infection in foot removed     . TONSILLECTOMY     Social History   Social History  . Marital status: Legally Separated    Spouse name: N/A  . Number of children: N/A  . Years of education: N/A   Occupational History  . Not on file.   Social History Main Topics  . Smoking status: Never Smoker  . Smokeless tobacco: Never Used  . Alcohol use No  . Drug use: No  . Sexual activity: Yes    Birth control/ protection: None   Other Topics Concern  . Not on file   Social History Narrative  . No narrative on file   No current facility-administered medications on file prior to encounter.    Current Outpatient Prescriptions on File Prior to Encounter  Medication Sig Dispense Refill  . gabapentin (NEURONTIN) 300 MG capsule Take 300 mg by mouth 2 (two) times daily.    . ondansetron (ZOFRAN) 4 MG tablet Take 1 tablet (4 mg total) by mouth every 8 (eight) hours as needed for nausea or vomiting. (Patient not  taking: Reported on 03/08/2016) 20 tablet 0   Allergies  Allergen Reactions  . Dilaudid [Hydromorphone Hcl] Hives and Shortness Of Breath  . Morphine And Related Hives and Shortness Of Breath    Pt states she can tolerate Percocet    I have reviewed patient's Past Medical Hx, Surgical Hx, Family Hx, Social Hx, medications and allergies.   ROS:  Review of Systems   Constitutional: Negative for fever and chills.  Gastrointestinal: Negative for nausea, vomiting, diarrhea and constipation.  Positive for abdominal pelvic pain Genitourinary: Negative for dysuria. negative for bleeding Musculoskeletal: Negative for back pain.  Neurological: Negative for dizziness and weakness.   Other systems negative  Physical Exam  Patient Vitals for the past 24 hrs:  BP Temp Temp src Pulse Resp Height Weight  03/08/16 1441 110/58 98.3 F (36.8 C) Oral (!) 43 18 5' 2.25" (1.581 m) 59.6 kg (131 lb 6.4 oz)    Physical Exam  Constitutional: Well-developed, well-nourished female in no acute distress.  Cardiovascular: normal rate and rhythm. Respiratory: normal effort, CTAB GI: Abd soft, non-tender. Pos BS x 4 MS: Extremities nontender, no edema, normal ROM Neurologic: Alert and oriented x 4.  GU: Neg CVAT.  PELVIC EXAM: deferred due to recent one  LAB RESULTS No results found for this or any previous visit (from the past 24 hour(s)).  --/--/A POS (08/03 1733)  IMAGING US Ob Comp Less 14 Wks  Result Date: 02/17/2016 CLINICAL DATA:  30 year old female with positive HCG levels presenting with pelvic pain. EXAM: OBSTETRIC <14 WK [place] AND TRANSVAGINAL OB US TECHNIQUE: Both transabdominal and transvaginal ultrasound examinations were performed for complete evaluation of the gestation as well as the maternal uterus, adnexal regions, and pelvic cul-de-sac. Transvaginal technique was performed to assess early pregnancy. COMPARISON:  None. FINDINGS: The uterus is anteverted and slightly heterogeneous. It  measures 8.7 x 5.3 x 6.9 cm. The endometrium is thickened and echogenic. There is a small fluid collection within the endometrial canal which may represent a gestational sac with irregular walls. No yolk sac identified. A 7 mm echogenic structure within this fluid collection may represent fetal pole or debris. If this echogenic structure is a true fetal pole the estimated gestational age based on crown-rump length of 7 mm is 6 weeks, 4 days with an estimated date of delivery on 10/07/2016. Cine images demonstrate flickering activity within this echogenic structure which may represent early fetal cardiac activity. Correlation with serial HCG levels and follow-up with ultrasound recommended. This was subchorionic hemorrhage. The maternal ovaries appear unremarkable. The right ovary measures 4.8 x 2.5 x 3.1 cm and the left ovary measures 4.1 x 1.6 x 2.5 cm. There is a 1.9 x 1.8 x 1.7 cm right ovarian complex appearing cyst. No significant free fluid within the pelvis. IMPRESSION: Probable irregular gestational sac containing a fetal pole with an estimated gestational age of [redacted] weeks, 4 days. Faint flickering motion on the cine images may represent early cardiac activity. Follow-up recommended. Electronically Signed   By: Anner Crete M.D.   On: 02/17/2016 00:37   US Ob Transvaginal  Result Date: 03/02/2016 CLINICAL DATA:  Pelvic pain. First trimester pregnancy with inconclusive fetal viability. Gestational age by LMP of 7 weeks 2 days. EXAM: TRANSVAGINAL OB ULTRASOUND TECHNIQUE: Transvaginal ultrasound was performed for complete evaluation of the gestation as well as the maternal uterus, adnexal regions, and pelvic cul-de-sac. COMPARISON:  02/23/2016, 02/20/2016, 02/18/2016, and 02/16/2016 FINDINGS: Intrauterine gestational sac: Single, irregular in shape Yolk sac:  Not visualized Embryo: Indeterminate ; ovoid structure measuring 16 mm within gestational sac was not seen 8 days ago. This could represent blood  clot (chorionic bump) versus abnormal embryo Cardiac Activity: None visualized MSD: 15  mm   6 w   2  d Subchorionic hemorrhage:  Moderate subchorionic hemorrhage seen. Maternal uterus/adnexae: Normal appearance of both ovaries. No adnexal mass identified. Trace amount of simple free fluid noted in cul-de-sac. IMPRESSION: Interval enlargement of intrauterine gestational sac since previous study, with question of abnormal embryo or blood clot (chorionic bump). Findings are suspicious but not definitive for failed pregnancy. Recommend continued followup by ultrasound in 7-10 days. This recommendation follows SRU consensus guidelines: Diagnostic Criteria for Nonviable Pregnancy Early in the First Trimester. [place] Med 2013KT:048977. Moderate subchorionic hemorrhage.  No adnexal mass identified. Electronically Signed   By: Earle Gell M.D.   On: 03/02/2016 15:58   US Ob Transvaginal  Result Date: 02/23/2016 CLINICAL DATA:  30 year old pregnant female presents with several weeks of pelvic pressure and spotting. Quantitative beta HCG 5,285, compared to 2,321 when measured 3 days prior. EDC by LMP: 10/17/2016, projecting to an expected gestational age of [redacted] weeks 1 day. EXAM: TRANSVAGINAL OB ULTRASOUND TECHNIQUE: Transvaginal ultrasound was performed for  complete evaluation of the gestation as well as the maternal uterus, adnexal regions, and pelvic cul-de-sac. COMPARISON:  02/20/2016 obstetric scan. FINDINGS: The anteverted anteflexed uterus measures 9.9 x 5.3 x 7.6 cm in size. There is concavity of the fundal endometrial contour, which could indicate a developmental uterine anomaly such as a subseptate uterus or arcuate configuration of the uterus. There are no uterine fibroids or other myometrial abnormalities. There is no definitive intrauterine gestational sac. There is a new irregular sac-like structure within the endometrial cavity measuring 1.0 x 0.3 x 1.1 cm (average diameter 0.8 cm), without internal yolk  sac or embryo and without a definitive double gestational sac sign. There is additional complex fluid within the right uterine horn. The right ovary measures 4.4 x 2.7 x 3.9 cm and contains a corpus luteum. The left ovary measures 2.9 x 2.4 x 2.9 cm. No suspicious ovarian or adnexal masses are demonstrated. No abnormal free fluid is seen in the pelvis. IMPRESSION: No definitive localization of the pregnancy on this scan. Irregular 0.8 cm sac-like structure within the endometrial cavity, newly visualized since the 02/20/2016 scan, with no yolk sac or embryo detected, which could represent a pseudo-gestational sac (decidual cyst) or an early gestational sac. No abnormal ovarian or adnexal masses. Sonographic differential diagnosis continues to include an early intrauterine gestation, spontaneous abortion or occult ectopic gestation. Recommend close clinical follow-up and serial serum beta HCG monitoring. A follow-up obstetric scan is recommended in 15-21 days or earlier as clinically warranted. Electronically Signed   By: Ilona Sorrel M.D.   On: 02/23/2016 16:34   US Ob Transvaginal  Result Date: 02/20/2016 CLINICAL DATA:  30 year old female pregnant female presenting with abdominal pain EXAM: TRANSVAGINAL OB ULTRASOUND TECHNIQUE: Transvaginal ultrasound was performed for complete evaluation of the gestation as well as the maternal uterus, adnexal regions, and pelvic cul-de-sac. COMPARISON:  Ultrasound dated 02/18/2016 and 02/16/2016 FINDINGS: The uterus is anteverted and appears homogeneous. 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 No intrauterine pregnancy identified. The previously seen gestational sac or fluid collection is not identified on this study. Small amount of fluid with low level internal echoes noted within the endometrial canal. The maternal ovaries appear unremarkable. The right ovary measures 4.4 x 3.7 x 2.9 cm. The left ovary measures 3.4 x 2.1 x 2.2 cm. A corpus luteum  is noted in the right ovary. No significant free fluid identified within pelvis. IMPRESSION: No intrauterine pregnancy identified. The previously seen gestational sac or loculated fluid collection is no longer present on this study. Findings most consistent with spontaneous miscarriage. Correlation with clinical exam and follow-up with serial HCG levels recommended. Unremarkable ovaries. Electronically Signed   By: Anner Crete M.D.   On: 02/20/2016 23:28   US Ob Transvaginal  Result Date: 02/18/2016 CLINICAL DATA:  30 year old female with worsening abdominal pain in the first trimester of pregnancy. Early IUP suspected on ultrasound 2 days ago. Subsequent encounter. Quantitative beta HCG 1,016 today, versus 705 2 days ago. Estimated gestational age by LMP 5 weeks 3 days. EXAM: TRANSVAGINAL OB ULTRASOUND TECHNIQUE: Transvaginal ultrasound was performed for complete evaluation of the gestation as well as the maternal uterus, adnexal regions, and pelvic cul-de-sac. COMPARISON:  02/16/2016 FINDINGS: Intrauterine gestational sac: 3.6 cm elongated fluid collection in the endometrial canal, which may reflect an irregularly shaped gestational sac. This appears more elongated than 2 days ago. Yolk sac:  Not visible Embryo:  Questionably visible on images 39 and 48 Cardiac Activity: None detected Presumed MSD: 17.7  mm   6 w   5  d Presumed CRL:   2.1  mm   5 w 5 d Maternal uterus/adnexae: No subchorionic hemorrhage or pelvic free fluid. Corpus luteum demonstrated in the right ovary which measures 4.5 x 2.9 x 3.5 cm. Normal left ovary which measures 1.8 x 3.0 x 1.7 cm. IMPRESSION: Findings are suspicious but not yet definitive for failed pregnancy. Recommend follow-up US in 10-14 days for definitive diagnosis. This recommendation follows SRU consensus guidelines: Diagnostic Criteria for Nonviable Pregnancy Early in the First Trimester. [employer] Med [id number]. Electronically Signed   By: Genevie Ann M.D.   On:  02/18/2016 19:13   US Ob Transvaginal  Result Date: 02/17/2016 CLINICAL DATA:  30 year old female with positive HCG levels presenting with pelvic pain. EXAM: OBSTETRIC <14 WK [place] AND TRANSVAGINAL OB US TECHNIQUE: Both transabdominal and transvaginal ultrasound examinations were performed for complete evaluation of the gestation as well as the maternal uterus, adnexal regions, and pelvic cul-de-sac. Transvaginal technique was performed to assess early pregnancy. COMPARISON:  None. FINDINGS: The uterus is anteverted and slightly heterogeneous. It measures 8.7 x 5.3 x 6.9 cm. The endometrium is thickened and echogenic. There is a small fluid collection within the endometrial canal which may represent a gestational sac with irregular walls. No yolk sac identified. A 7 mm echogenic structure within this fluid collection may represent fetal pole or debris. If this echogenic structure is a true fetal pole the estimated gestational age based on crown-rump length of 7 mm is 6 weeks, 4 days with an estimated date of delivery on 10/07/2016. Cine images demonstrate flickering activity within this echogenic structure which may represent early fetal cardiac activity. Correlation with serial HCG levels and follow-up with ultrasound recommended. This was subchorionic hemorrhage. The maternal ovaries appear unremarkable. The right ovary measures 4.8 x 2.5 x 3.1 cm and the left ovary measures 4.1 x 1.6 x 2.5 cm. There is a 1.9 x 1.8 x 1.7 cm right ovarian complex appearing cyst. No significant free fluid within the pelvis. IMPRESSION: Probable irregular gestational sac containing a fetal pole with an estimated gestational age of [redacted] weeks, 4 days. Faint flickering motion on the cine images may represent early cardiac activity. Follow-up recommended. Electronically Signed   By: Anner Crete M.D.   On: 02/17/2016 00:37    MAU Management/MDM: Discussed options again including expectant management, Cytotec and D&C.  She wishes to  proceed with D&C Reviewed process of NPO p MN, presentation to preop, and recovery Will give her a few Percocet for use at night.  Consulted Dr Kennon Rounds Will message [place] to schedule surgery.   ASSESSMENT Missed abortion  PLAN Discharge home Rx Percocet #10 for use at home, severe pain at night. Continue ibuprofen for cramping  Pt stable at time of discharge. Encouraged to return here or to other Urgent Care/ED if she develops worsening of symptoms, increase in pain, fever, or other concerning symptoms.    Hansel Feinstein CNM, MSN Certified Nurse-Midwife 03/08/2016  6:55 PM

## 2016-03-08 NOTE — MAU Note (Signed)
Urine in lab 

## 2016-03-08 NOTE — Discharge Instructions (Signed)
Incomplete Miscarriage A miscarriage is the sudden loss of an unborn baby (fetus) before the 20th week of pregnancy. In an incomplete miscarriage, parts of the fetus or placenta (afterbirth) remain in the body.  Having a miscarriage can be an emotional experience. Talk with your health care provider about any questions you may have about miscarrying, the grieving process, and your future pregnancy plans. CAUSES   Problems with the fetal chromosomes that make it impossible for the baby to develop normally. Problems with the baby's genes or chromosomes are most often the result of errors that occur by chance as the embryo divides and grows. The problems are not inherited from the parents.  Infection of the cervix or uterus.  Hormone problems.  Problems with the cervix, such as having an incompetent cervix. This is when the tissue in the cervix is not strong enough to hold the pregnancy.  Problems with the uterus, such as an abnormally shaped uterus, uterine fibroids, or congenital abnormalities.  Certain medical conditions.  Smoking, drinking alcohol, or taking illegal drugs.  Trauma. SYMPTOMS   Vaginal bleeding or spotting, with or without cramps or pain.  Pain or cramping in the abdomen or lower back.  Passing fluid, tissue, or blood clots from the vagina. DIAGNOSIS  Your health care provider will perform a physical exam. You may also have an ultrasound to confirm the miscarriage. Blood or urine tests may also be ordered. TREATMENT   Usually, a dilation and curettage (D&C) procedure is performed. During a D&C procedure, the cervix is widened (dilated) and any remaining fetal or placental tissue is gently removed from the uterus.  Antibiotic medicines are prescribed if there is an infection. Other medicines may be given to reduce the size of the uterus (contract) if there is a lot of bleeding.  If you have Rh negative blood and your baby was Rh positive, you will need a Rho (D)  immune globulin shot. This shot will protect any future baby from having Rh blood problems in future pregnancies.  You may be confined to bed rest. This means you should stay in bed and only get up to use the bathroom. HOME CARE INSTRUCTIONS   Rest as directed by your health care provider.  Restrict activity as directed by your health care provider. You may be allowed to continue light activity if curettage was not done but you require further treatment.  Keep track of the number of pads you use each day. Keep track of how soaked (saturated) they are. Record this information.  Do not  use tampons.  Do not douche or have sexual intercourse until approved by your health care provider.  Keep all follow-up appointments for reevaluation and continuing management.  Only take over-the-counter or prescription medicines for pain, fever, or discomfort as directed by your health care provider.  Take antibiotic medicine as directed by your health care provider. Make sure you finish it even if you start to feel better. SEEK IMMEDIATE MEDICAL CARE IF:   You experience severe cramps in your stomach, back, or abdomen.  You have an unexplained temperature (make sure to record these temperatures).  You pass large clots or tissue (save these for your health care provider to inspect).  Your bleeding increases.  You become light-headed, weak, or have fainting episodes. MAKE SURE YOU:   Understand these instructions.  Will watch your condition.  Will get help right away if you are not doing well or get worse.   This information is not intended to   replace advice given to you by your health care provider. Make sure you discuss any questions you have with your health care provider.   Document Released: 07/04/2005 Document Revised: 07/25/2014 Document Reviewed: 01/31/2013 Elsevier Interactive Patient Education 2016 [employer]  

## 2016-03-09 ENCOUNTER — Encounter (HOSPITAL_COMMUNITY): Payer: Self-pay | Admitting: Anesthesiology

## 2016-03-09 ENCOUNTER — Inpatient Hospital Stay (HOSPITAL_COMMUNITY): Payer: Medicaid Other | Admitting: Anesthesiology

## 2016-03-09 ENCOUNTER — Encounter (HOSPITAL_COMMUNITY)
Admission: AD | Disposition: A | Discharge status: Home / Self Care | Payer: Self-pay | Source: Ambulatory Visit | Attending: Obstetrics & Gynecology

## 2016-03-09 ENCOUNTER — Inpatient Hospital Stay (HOSPITAL_COMMUNITY): Payer: Medicaid Other

## 2016-03-09 ENCOUNTER — Ambulatory Visit (HOSPITAL_COMMUNITY)
Admission: AD | Admit: 2016-03-09 | Discharge: 2016-03-09 | Disposition: A | Discharge status: Home / Self Care | Payer: Medicaid Other | Source: Ambulatory Visit | Attending: Obstetrics & Gynecology | Admitting: Obstetrics & Gynecology

## 2016-03-09 DIAGNOSIS — Z3A08 8 weeks gestation of pregnancy: Secondary | ICD-10-CM | POA: Diagnosis not present

## 2016-03-09 DIAGNOSIS — O021 Missed abortion: Secondary | ICD-10-CM

## 2016-03-09 HISTORY — PX: DILATION AND EVACUATION: SHX1459

## 2016-03-09 LAB — COMPREHENSIVE METABOLIC PANEL
ALT: 12 U/L — ABNORMAL LOW (ref 14–54)
AST: 12 U/L — ABNORMAL LOW (ref 15–41)
Albumin: 4.1 g/dL (ref 3.5–5.0)
Alkaline Phosphatase: 39 U/L (ref 38–126)
Anion gap: 6 (ref 5–15)
BUN: 16 mg/dL (ref 6–20)
CO2: 24 mmol/L (ref 22–32)
Calcium: 9.1 mg/dL (ref 8.9–10.3)
Chloride: 105 mmol/L (ref 101–111)
Creatinine, Ser: 0.69 mg/dL (ref 0.44–1.00)
GFR calc Af Amer: >60 mL/min (ref >60)
GFR calc non Af Amer: >60 mL/min (ref >60)
Glucose, Bld: 90 mg/dL (ref 65–99)
Potassium: 4 mmol/L (ref 3.5–5.1)
Sodium: 135 mmol/L (ref 135–145)
Total Bilirubin: 0.8 mg/dL (ref 0.3–1.2)
Total Protein: 6.7 g/dL (ref 6.5–8.1)

## 2016-03-09 LAB — CBC
HCT: 36.4 % (ref 36.0–46.0)
Hemoglobin: 12.6 g/dL (ref 12.0–15.0)
MCH: 28.1 pg (ref 26.0–34.0)
MCHC: 34.6 g/dL (ref 30.0–36.0)
MCV: 81.1 fL (ref 78.0–100.0)
Platelets: 123 10*3/uL — ABNORMAL LOW (ref 150–400)
RBC: 4.49 MIL/uL (ref 3.87–5.11)
RDW: 13.2 % (ref 11.5–15.5)
WBC: 7.6 10*3/uL (ref 4.0–10.5)

## 2016-03-09 SURGERY — DILATION AND EVACUATION, UTERUS
Anesthesia: General | Site: Vagina

## 2016-03-09 MED ORDER — FENTANYL CITRATE (PF) 100 MCG/2ML IJ SOLN
INTRAMUSCULAR | Status: AC
  Filled 2016-03-09: qty 2

## 2016-03-09 MED ORDER — DEXAMETHASONE SODIUM PHOSPHATE 4 MG/ML IJ SOLN
INTRAMUSCULAR | Status: AC
  Filled 2016-03-09: qty 1

## 2016-03-09 MED ORDER — FENTANYL CITRATE (PF) 100 MCG/2ML IJ SOLN
INTRAMUSCULAR | Status: AC
  Filled 2016-03-09: qty 4

## 2016-03-09 MED ORDER — FENTANYL CITRATE (PF) 100 MCG/2ML IJ SOLN
INTRAMUSCULAR | Status: DC | PRN
  Administered 2016-03-09: 100 ug via INTRAVENOUS
  Administered 2016-03-09 (×2): 25 ug via INTRAVENOUS
  Administered 2016-03-09: 50 ug via INTRAVENOUS

## 2016-03-09 MED ORDER — LIDOCAINE HCL (CARDIAC) 20 MG/ML IV SOLN
INTRAVENOUS | Status: DC | PRN
  Administered 2016-03-09: 80 mg via INTRAVENOUS

## 2016-03-09 MED ORDER — LACTATED RINGERS IV SOLN
INTRAVENOUS | Status: DC | PRN
  Administered 2016-03-09: 14:00:00 via INTRAVENOUS

## 2016-03-09 MED ORDER — LACTATED RINGERS IV SOLN: INTRAVENOUS | Status: DC

## 2016-03-09 MED ORDER — DEXAMETHASONE SODIUM PHOSPHATE 4 MG/ML IJ SOLN
INTRAMUSCULAR | Status: DC | PRN
  Administered 2016-03-09: 4 mg via INTRAVENOUS

## 2016-03-09 MED ORDER — GLYCOPYRROLATE 0.2 MG/ML IJ SOLN
INTRAMUSCULAR | Status: AC
  Filled 2016-03-09: qty 1

## 2016-03-09 MED ORDER — MIDAZOLAM HCL 2 MG/2ML IJ SOLN
INTRAMUSCULAR | Status: AC
  Filled 2016-03-09: qty 2

## 2016-03-09 MED ORDER — SOD CITRATE-CITRIC ACID 500-334 MG/5ML PO SOLN
30.0000 mL | Freq: Once | ORAL | Status: AC
  Administered 2016-03-09: 30 mL via ORAL
  Filled 2016-03-09: qty 15

## 2016-03-09 MED ORDER — ONDANSETRON HCL 4 MG/2ML IJ SOLN
INTRAMUSCULAR | Status: DC | PRN
  Administered 2016-03-09: 4 mg via INTRAVENOUS

## 2016-03-09 MED ORDER — PROPOFOL 500 MG/50ML IV EMUL
INTRAVENOUS | Status: DC | PRN
  Administered 2016-03-09: 120 ug/kg/min via INTRAVENOUS

## 2016-03-09 MED ORDER — PROPOFOL 10 MG/ML IV BOLUS
INTRAVENOUS | Status: AC
  Filled 2016-03-09: qty 40

## 2016-03-09 MED ORDER — PROPOFOL 10 MG/ML IV BOLUS
INTRAVENOUS | Status: DC | PRN
  Administered 2016-03-09: 20 mg via INTRAVENOUS

## 2016-03-09 MED ORDER — LIDOCAINE HCL (CARDIAC) 20 MG/ML IV SOLN
INTRAVENOUS | Status: AC
Start: 2016-03-09 — End: 2016-03-09
  Filled 2016-03-09: qty 5

## 2016-03-09 MED ORDER — KETOROLAC TROMETHAMINE 30 MG/ML IJ SOLN
INTRAMUSCULAR | Status: DC | PRN
  Administered 2016-03-09: 30 mg via INTRAVENOUS

## 2016-03-09 MED ORDER — LACTATED RINGERS IV BOLUS (SEPSIS)
1000.0000 mL | Freq: Once | INTRAVENOUS | Status: AC
  Administered 2016-03-09: 1000 mL via INTRAVENOUS

## 2016-03-09 MED ORDER — BUPIVACAINE HCL (PF) 0.5 % IJ SOLN
INTRAMUSCULAR | Status: DC | PRN
  Administered 2016-03-09: 10 mL

## 2016-03-09 MED ORDER — FAMOTIDINE IN NACL 20-0.9 MG/50ML-% IV SOLN
20.0000 mg | Freq: Once | INTRAVENOUS | Status: AC
  Administered 2016-03-09: 20 mg via INTRAVENOUS
  Filled 2016-03-09: qty 50

## 2016-03-09 MED ORDER — ONDANSETRON HCL 4 MG/2ML IJ SOLN
INTRAMUSCULAR | Status: AC
  Filled 2016-03-09: qty 2

## 2016-03-09 SURGICAL SUPPLY — 19 items
CATH ROBINSON RED A/P 16FR (CATHETERS) ×2 IMPLANT
CLOTH BEACON ORANGE TIMEOUT ST (SAFETY) ×2 IMPLANT
DECANTER SPIKE VIAL GLASS SM (MISCELLANEOUS) ×2 IMPLANT
GLOVE BIOGEL PI IND STRL 7.0 (GLOVE) ×2 IMPLANT
GLOVE BIOGEL PI INDICATOR 7.0 (GLOVE) ×2
GLOVE ECLIPSE 7.0 STRL STRAW (GLOVE) ×4 IMPLANT
GOWN STRL REUS W/TWL LRG LVL3 (GOWN DISPOSABLE) ×4 IMPLANT
GOWN STRL REUS W/TWL XL LVL3 (GOWN DISPOSABLE) ×2 IMPLANT
KIT BERKELEY 1ST TRIMESTER 3/8 (MISCELLANEOUS) ×2 IMPLANT
NS IRRIG 1000ML POUR BTL (IV SOLUTION) ×2 IMPLANT
PACK VAGINAL MINOR WOMEN LF (CUSTOM PROCEDURE TRAY) ×2 IMPLANT
PAD OB MATERNITY 4.3X12.25 (PERSONAL CARE ITEMS) ×2 IMPLANT
PAD PREP 24X48 CUFFED NSTRL (MISCELLANEOUS) ×2 IMPLANT
SET BERKELEY SUCTION TUBING (SUCTIONS) ×2 IMPLANT
TOWEL OR 17X24 6PK STRL BLUE (TOWEL DISPOSABLE) ×4 IMPLANT
VACURETTE 10 RIGID CVD (CANNULA) IMPLANT
VACURETTE 7MM CVD STRL WRAP (CANNULA) ×2 IMPLANT
VACURETTE 8 RIGID CVD (CANNULA) IMPLANT
VACURETTE 9 RIGID CVD (CANNULA) IMPLANT

## 2016-03-09 NOTE — Transfer of Care (Signed)
Immediate Anesthesia Transfer of Care Note  Patient: Kristy Hunt  Procedure(s) Performed: Procedure(s): DILATATION AND EVACUATION (N/A)  Patient Location: PACU  Anesthesia Type:MAC  Level of Consciousness: awake, alert  and oriented  Airway & Oxygen Therapy: Patient Spontanous Breathing  Post-op Assessment: Report given to RN and Post -op Vital signs reviewed and stable  Post vital signs: Reviewed and stable  Last Vitals:  Vitals:   03/09/16 1020 03/09/16 1023  BP: 91/62 104/69  Pulse: 76 73  Resp:  18  Temp:  36.7 C    Last Pain:  Vitals:   03/09/16 1023  TempSrc: Oral  PainSc:       Patients Stated Pain Goal: 0 ([id number])  Complications: No apparent anesthesia complications

## 2016-03-09 NOTE — Brief Op Note (Signed)
03/09/2016  4:26 PM  PATIENT:  Kristy Hunt  30 y.o. female  PRE-OPERATIVE DIAGNOSIS:  missed AB at 8 weeks   POST-OPERATIVE DIAGNOSIS:  missed abortion at 8 weeks  PROCEDURE:  Procedure(s): DILATATION AND EVACUATION (N/A)  SURGEON:  Surgeon(s) and Role:    * Lavonia Drafts, MD - Primary  ANESTHESIA:   general and paracervical block  EBL:  Total I/O In: 1000 [I.V.:1000] Out: 62 [Urine:50; Blood:5]  BLOOD ADMINISTERED:none  DRAINS: none   LOCAL MEDICATIONS USED:  MARCAINE     SPECIMEN:  Source of Specimen:  products of conception  DISPOSITION OF SPECIMEN:  PATHOLOGY  COUNTS:  YES  TOURNIQUET:  * No tourniquets in log *  DICTATION: .Note written in EPIC  PLAN OF CARE: Discharge to home after PACU  PATIENT DISPOSITION:  PACU - hemodynamically stable.   Delay start of Pharmacological VTE agent (>24hrs) due to surgical blood loss or risk of bleeding: yes  Aithan Farrelly L. Harraway-Smith, M.D., Cherlynn June

## 2016-03-09 NOTE — Discharge Instructions (Signed)

## 2016-03-09 NOTE — MAU Note (Signed)
Patient was seen in MAU yesterday with known failed pregnancy. Was prescribed cytotec, but states she was afraid to take it. 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 Patient is not bleeding, but feels some pressure. States today she was at work and felt sudden onset of dizziness and nausea. States she called  the nurse line and was advised to come to MAU.

## 2016-03-09 NOTE — Anesthesia Preprocedure Evaluation (Addendum)
Anesthesia Evaluation  Patient identified by MRN, date of birth, ID band Patient awake    Reviewed: Allergy & Precautions, NPO status , Patient's Chart, lab work & pertinent test results  History of Anesthesia Complications (+) history of anesthetic complications  Airway Mallampati: I  TM Distance: >3 FB Neck ROM: Full    Dental no notable dental hx.    Pulmonary neg pulmonary ROS,    Pulmonary exam normal breath sounds clear to auscultation       Cardiovascular negative cardio ROS Normal cardiovascular exam Rhythm:Regular Rate:Normal     Neuro/Psych negative neurological ROS  negative psych ROS   GI/Hepatic negative GI ROS, Neg liver ROS,   Endo/Other  negative endocrine ROS  Renal/GU negative Renal ROS  negative genitourinary   Musculoskeletal negative musculoskeletal ROS (+)   Abdominal   Peds negative pediatric ROS (+)  Hematology negative hematology ROS (+)   Anesthesia Other Findings   Reproductive/Obstetrics negative OB ROS                            Anesthesia Physical Anesthesia Plan  ASA: II  Anesthesia Plan: General   Post-op Pain Management:    Induction: Intravenous  Airway Management Planned: LMA  Additional Equipment:   Intra-op Plan:   Post-operative Plan: Extubation in OR  Informed Consent: I have reviewed the patients History and Physical, chart, labs and discussed the procedure including the risks, benefits and alternatives for the proposed anesthesia with the patient or authorized representative who has indicated his/her understanding and acceptance.   Dental advisory given  Plan Discussed with: CRNA  Anesthesia Plan Comments:         Anesthesia Quick Evaluation

## 2016-03-09 NOTE — MAU Note (Signed)
To OR

## 2016-03-09 NOTE — Op Note (Signed)
03/09/2016  4:26 PM  PATIENT:  Kristy Hunt  30 y.o. female  PRE-OPERATIVE DIAGNOSIS:  missed AB at 8 weeks   POST-OPERATIVE DIAGNOSIS:  missed abortion at 8 weeks  PROCEDURE:  Procedure(s): DILATATION AND EVACUATION (N/A)  SURGEON:  Surgeon(s) and Role:    * Lavonia Drafts, MD - Primary  ANESTHESIA:   general and paracervical block  EBL:  Total I/O In: 1000 [I.V.:1000] Out: 63 [Urine:50; Blood:5]  BLOOD ADMINISTERED:none  DRAINS: none   LOCAL MEDICATIONS USED:  MARCAINE     SPECIMEN:  Source of Specimen:  products of conception  DISPOSITION OF SPECIMEN:  PATHOLOGY  COUNTS:  YES  TOURNIQUET:  * No tourniquets in log *  DICTATION: .Note written in EPIC  PLAN OF CARE: Discharge to home after PACU  PATIENT DISPOSITION:  PACU - hemodynamically stable.   Delay start of Pharmacological VTE agent (>24hrs) due to surgical blood loss or risk of bleeding: yes  INDICATIONS: 30 y.o. G4P3 with MAB at [redacted] weeks gestation, needing surgical completion.  Risks of surgery were discussed with the patient including but not limited to: bleeding which may require transfusion; infection which may require antibiotics; injury to uterus or surrounding organs; need for additional procedures including laparotomy or laparoscopy; possibility of intrauterine scarring which may impair future fertility; and other postoperative/anesthesia complications. Written informed consent was obtained.    FINDINGS:  A 8 week size uterus, moderate amounts of products of conception, specimen sent to pathology.   PROCEDURE DETAILS:  The patient received intravenous Doxycycline while in the preoperative area.  She was then taken to the operating room where monitored intravenous sedation was administered and was found to be adequate.  After an adequate timeout was performed, she was placed in the dorsal lithotomy position and examined; then prepped and draped in the sterile manner.   Her bladder was  catheterized for an unmeasured amount of clear, yellow urine. A vaginal speculum was then placed in the patient's vagina and a single tooth tenaculum was applied to the anterior lip of the cervix.  A paracervical block using 30 ml of 0.5% Marcaine was administered. The cervix was gently dilated to accommodate a 7 mm curved suction curette that was gently advanced to the uterine fundus.  The suction device was then activated and curette slowly rotated to clear the uterus of products of conception.  A sharp curettage was then performed to confirm complete emptying of the uterus. There was minimal bleeding noted and the tenaculum removed with good hemostasis noted.   All instruments were removed from the patient's vagina. The patient tolerated the procedure well and was taken to the recovery area awake, and in stable condition.  The patient will be discharged to home as per PACU criteria.  Routine postoperative instructions given.  She was prescribed Ibuprofen.  She will follow up in the clinic in 2 weeks for postoperative evaluation.  Latiya Navia L. Harraway-Smith, M.D., Cherlynn June

## 2016-03-09 NOTE — H&P (Signed)
Kristy Hunt is an 30 y.o. female G4P3 @[redacted]w[redacted]d  by LMP admitted for [facility] following failed pregnancy. She initially presented to ED visit on 8/1, then MAU/office visits with the following results:  8/3 with quant hcg of 1016 and US findings of GS no YS or FP 8/5 with quant hcg of 2321 and US findings of GS no longer visible 8/8 with quang hcg of 5285 with US findings of irregular shaped GS, no YS or FP 8/16 with [place]cg of [address] and US findings of [redacted]w[redacted]d GS and possible FP vs blood clot  Pt is NPO since last night except water to swallow pain medication this morning.   She denies vaginal bleeding, vaginal itching/burning, urinary symptoms, h/a, n/v, or fever/chills.    Menstrual History:  Patient's last menstrual period was 01/11/2016.    Past Medical History:  Diagnosis Date  . Bigeminal pulse   . Complication of anesthesia     Past Surgical History:  Procedure Laterality Date  . CHOLECYSTECTOMY    . FOOT SURGERY    . infection in foot removed     . TONSILLECTOMY      Family History  Problem Relation Age of Onset  . Endometriosis Mother   . Diabetes Maternal Grandfather   . Diabetes Paternal Grandmother     Social History:  reports that she has never smoked. She has never used smokeless tobacco. She reports that she does not drink alcohol or use drugs.  Allergies:  Allergies  Allergen Reactions  . Dilaudid [Hydromorphone Hcl] Hives and Shortness Of Breath  . Morphine And Related Hives and Shortness Of Breath    Pt states she can tolerate Percocet    Prescriptions Prior to Admission  Medication Sig Dispense Refill Last Dose  . gabapentin (NEURONTIN) 300 MG capsule Take 300 mg by mouth 2 (two) times daily.   03/08/2016 at Unknown time  . ibuprofen (ADVIL,MOTRIN) 800 MG tablet Take 800 mg by mouth every 8 (eight) hours as needed for mild pain or moderate pain.   03/08/2016 at Unknown time  . Liniments (SALONPAS PAIN RELIEF PATCH EX) Apply 1 patch topically daily as  needed (For pain.).   03/08/2016 at Unknown time  . ondansetron (ZOFRAN) 4 MG tablet Take 4 mg by mouth every 8 (eight) hours as needed for nausea or vomiting.   03/09/2016 at Unknown time  . oxyCODONE-acetaminophen (PERCOCET/ROXICET) 5-325 MG tablet Take 1-2 tablets by mouth every 6 (six) hours as needed. (Patient taking differently: Take 1-2 tablets by mouth every 6 (six) hours as needed for moderate pain. ) 10 tablet 0 03/09/2016 at Unknown time    Review of Systems  Constitutional: Negative for chills, fever and malaise/fatigue.  Eyes: Negative for blurred vision.  Respiratory: Negative for cough and shortness of breath.   Cardiovascular: Negative for chest pain.  Gastrointestinal: Negative for heartburn, nausea and vomiting.  Genitourinary: Negative for dysuria, frequency and urgency.       Pelvic pressure   Musculoskeletal: Negative.   Neurological: Negative for dizziness and headaches.  Psychiatric/Behavioral: Negative for depression.    Blood pressure 104/69, pulse 73, temperature 98 F (36.7 C), temperature source Oral, resp. rate 18, last menstrual period 01/11/2016, SpO2 100 %. Physical Exam  Nursing note and vitals reviewed. Constitutional: She is oriented to person, place, and time. She appears well-developed and well-nourished.  Neck: Normal range of motion.  Cardiovascular: Normal rate, regular rhythm and normal heart sounds.   Respiratory: Effort normal and breath sounds normal.  GI:  Soft.  Musculoskeletal: Normal range of motion.  Neurological: She is alert and oriented to person, place, and time.  Skin: Skin is warm and dry.  Psychiatric: She has a normal mood and affect. Her behavior is normal. Judgment and thought content normal.    Results for orders placed or performed during the hospital encounter of 03/09/16 (from the past 24 hour(s))  CBC     Status: Abnormal   Collection Time: 03/09/16 10:42 AM  Result Value Ref Range   WBC 7.6 4.0 - 10.5 K/uL   RBC 4.49  3.87 - 5.11 MIL/uL   Hemoglobin 12.6 12.0 - 15.0 g/dL   HCT 36.4 36.0 - 46.0 %   MCV 81.1 78.0 - 100.0 fL   MCH 28.1 26.0 - 34.0 pg   MCHC 34.6 30.0 - 36.0 g/dL   RDW 13.2 11.5 - 15.5 %   Platelets 123 (L) 150 - 400 K/uL  Comprehensive metabolic panel     Status: Abnormal   Collection Time: 03/09/16 10:42 AM  Result Value Ref Range   Sodium 135 135 - 145 mmol/L   Potassium 4.0 3.5 - 5.1 mmol/L   Chloride 105 101 - 111 mmol/L   CO2 24 22 - 32 mmol/L   Glucose, Bld 90 65 - 99 mg/dL   BUN 16 6 - 20 mg/dL   Creatinine, Ser 0.69 0.44 - 1.00 mg/dL   Calcium 9.1 8.9 - 10.3 mg/dL   Total Protein 6.7 6.5 - 8.1 g/dL   Albumin 4.1 3.5 - 5.0 g/dL   AST 12 (L) 15 - 41 U/L   ALT 12 (L) 14 - 54 U/L   Alkaline Phosphatase 39 38 - 126 U/L   Total Bilirubin 0.8 0.3 - 1.2 mg/dL   GFR calc non Af Amer >60 >60 mL/min   GFR calc Af Amer >60 >60 mL/min   Anion gap 6 5 - 15    ABO/Rh: A positive  Assessment/Plan: Admit to OR for D&C by Dr Ihor Dow  LEFTWICH-KIRBY, LISA 03/09/2016, 3:03 PM

## 2016-03-10 ENCOUNTER — Encounter (HOSPITAL_COMMUNITY): Payer: Self-pay | Admitting: Obstetrics & Gynecology

## 2016-03-10 NOTE — Anesthesia Postprocedure Evaluation (Signed)
Anesthesia Post Note  Patient: Kristy Hunt  Procedure(s) Performed: Procedure(s) (LRB): DILATATION AND EVACUATION (N/A)  Patient location during evaluation: PACU Anesthesia Type: MAC Level of consciousness: awake and alert Pain management: pain level controlled Vital Signs Assessment: post-procedure vital signs reviewed and stable Respiratory status: spontaneous breathing, nonlabored ventilation, respiratory function stable and patient connected to nasal cannula oxygen Cardiovascular status: stable and blood pressure returned to baseline Anesthetic complications: no    Last Vitals:  Vitals:   03/09/16 1605 03/09/16 1645  BP:  (!) 110/50  Pulse: (!) 53 (!) 56  Resp: 19 18  Temp:      Last Pain:  Vitals:   03/09/16 1605  TempSrc:   PainSc: 0-No pain                 Jamal Pavon J

## 2016-03-10 NOTE — Anesthesia Postprocedure Evaluation (Deleted)
Anesthesia Post Note  Patient: Marcellus Scott  Procedure(s) Performed: Procedure(s) (LRB): DILATATION AND EVACUATION (N/A)  Patient location during evaluation: PACU Anesthesia Type: General Level of consciousness: awake and alert Pain management: pain level controlled Vital Signs Assessment: post-procedure vital signs reviewed and stable Respiratory status: spontaneous breathing, nonlabored ventilation, respiratory function stable and patient connected to nasal cannula oxygen Cardiovascular status: blood pressure returned to baseline and stable Postop Assessment: no signs of nausea or vomiting Anesthetic complications: no    Last Vitals:  Vitals:   03/09/16 1605 03/09/16 1645  BP:  (!) 110/50  Pulse: (!) 53 (!) 56  Resp: 19 18  Temp:      Last Pain:  Vitals:   03/09/16 1605  TempSrc:   PainSc: 0-No pain                 Larenzo Caples J

## 2016-03-18 ENCOUNTER — Inpatient Hospital Stay (HOSPITAL_COMMUNITY)
Admission: AD | Admit: 2016-03-18 | Discharge: 2016-03-18 | Discharge status: Against Medical Advice | Payer: Medicaid Other | Source: Ambulatory Visit | Attending: Obstetrics & Gynecology | Admitting: Obstetrics & Gynecology

## 2016-03-18 ENCOUNTER — Encounter (HOSPITAL_COMMUNITY): Payer: Self-pay

## 2016-03-18 DIAGNOSIS — Z3201 Encounter for pregnancy test, result positive: Secondary | ICD-10-CM | POA: Diagnosis not present

## 2016-03-18 LAB — POCT PREGNANCY, URINE: Preg Test, Ur: POSITIVE — AB

## 2016-03-18 LAB — URINALYSIS, ROUTINE W REFLEX MICROSCOPIC
Bilirubin Urine: NEGATIVE
Glucose, UA: NEGATIVE mg/dL
Ketones, ur: NEGATIVE mg/dL
Nitrite: NEGATIVE
Protein, ur: 30 mg/dL — AB
Specific Gravity, Urine: 1.02 (ref 1.005–1.030)
pH: 7 (ref 5.0–8.0)

## 2016-03-18 LAB — URINE MICROSCOPIC-ADD ON

## 2016-03-18 NOTE — MAU Note (Addendum)
Had D&C last week for past two days has cramping, pins and needles pain in vaginal area.

## 2016-03-18 NOTE — MAU Note (Signed)
Pt needs to leave to pick kids up from school. Advised to come back later if she can get child care. And still has concerns.

## 2016-03-24 ENCOUNTER — Ambulatory Visit (INDEPENDENT_AMBULATORY_CARE_PROVIDER_SITE_OTHER): Payer: Medicaid Other | Admitting: Obstetrics & Gynecology

## 2016-03-24 ENCOUNTER — Encounter: Payer: Self-pay | Admitting: Obstetrics & Gynecology

## 2016-03-24 VITALS — BP 114/72 | HR 43 | Ht 63.0 in | Wt 129.0 lb

## 2016-03-24 DIAGNOSIS — Z23 Encounter for immunization: Secondary | ICD-10-CM | POA: Diagnosis not present

## 2016-03-24 DIAGNOSIS — Z3009 Encounter for other general counseling and advice on contraception: Secondary | ICD-10-CM

## 2016-03-24 DIAGNOSIS — Z9889 Other specified postprocedural states: Secondary | ICD-10-CM | POA: Diagnosis not present

## 2016-03-24 DIAGNOSIS — Z3042 Encounter for surveillance of injectable contraceptive: Secondary | ICD-10-CM

## 2016-03-24 MED ORDER — MEDROXYPROGESTERONE ACETATE 150 MG/ML IM SUSP
150.0000 mg | Freq: Once | INTRAMUSCULAR | Status: AC
  Administered 2016-03-24: 150 mg via INTRAMUSCULAR

## 2016-03-24 NOTE — Patient Instructions (Signed)
Levonorgestrel intrauterine device (IUD) What is this medicine? LEVONORGESTREL IUD (LEE voe nor jes trel) is a contraceptive (birth control) device. The device is placed inside the uterus by a healthcare professional. It is used to prevent pregnancy and can also be used to treat heavy bleeding that occurs during your period. Depending on the device, it can be used for 3 to 5 years. This medicine may be used for other purposes; ask your health care provider or pharmacist if you have questions. What should I tell my health care provider before I take this medicine? They need to know if you have any of these conditions: -abnormal Pap smear -cancer of the breast, uterus, or cervix -diabetes -endometritis -genital or pelvic infection now or in the past -have more than one sexual partner or your partner has more than one partner -heart disease -history of an ectopic or tubal pregnancy -immune system problems -IUD in place -liver disease or tumor -problems with blood clots or take blood-thinners -use intravenous drugs -uterus of unusual shape -vaginal bleeding that has not been explained -an unusual or allergic reaction to levonorgestrel, other hormones, silicone, or polyethylene, medicines, foods, dyes, or preservatives -pregnant or trying to get pregnant -breast-feeding How should I use this medicine? This device is placed inside the uterus by a health care professional. Talk to your pediatrician regarding the use of this medicine in children. Special care may be needed. Overdosage: If you think you have taken too much of this medicine contact a poison control center or emergency room at once. NOTE: This medicine is only for you. Do not share this medicine with others. What if I miss a dose? This does not apply. What may interact with this medicine? Do not take this medicine with any of the following medications: -amprenavir -bosentan -fosamprenavir This medicine may also interact with  the following medications: -aprepitant -barbiturate medicines for inducing sleep or treating seizures -bexarotene -griseofulvin -medicines to treat seizures like carbamazepine, ethotoin, felbamate, oxcarbazepine, phenytoin, topiramate -modafinil -pioglitazone -rifabutin -rifampin -rifapentine -some medicines to treat HIV infection like atazanavir, indinavir, lopinavir, nelfinavir, tipranavir, ritonavir -St. John's wort -warfarin This list may not describe all possible interactions. Give your health care provider a list of all the medicines, herbs, non-prescription drugs, or dietary supplements you use. Also tell them if you smoke, drink alcohol, or use illegal drugs. Some items may interact with your medicine. What should I watch for while using this medicine? Visit your doctor or health care professional for regular check ups. See your doctor if you or your partner has sexual contact with others, becomes HIV positive, or gets a sexual transmitted disease. This product does not protect you against HIV infection (AIDS) or other sexually transmitted diseases. You can check the placement of the IUD yourself by reaching up to the top of your vagina with clean fingers to feel the threads. Do not pull on the threads. It is a good habit to check placement after each menstrual period. Call your doctor right away if you feel more of the IUD than just the threads or if you cannot feel the threads at all. The IUD may come out by itself. You may become pregnant if the device comes out. If you notice that the IUD has come out use a backup birth control method like condoms and call your health care provider. Using tampons will not change the position of the IUD and are okay to use during your period. What side effects may I notice from receiving this medicine?   Side effects that you should report to your doctor or health care professional as soon as possible: -allergic reactions like skin rash, itching or  hives, swelling of the face, lips, or tongue -fever, flu-like symptoms -genital sores -high blood pressure -no menstrual period for 6 weeks during use -pain, swelling, warmth in the leg -pelvic pain or tenderness -severe or sudden headache -signs of pregnancy -stomach cramping -sudden shortness of breath -trouble with balance, talking, or walking -unusual vaginal bleeding, discharge -yellowing of the eyes or skin Side effects that usually do not require medical attention (report to your doctor or health care professional if they continue or are bothersome): -acne -breast pain -change in sex drive or performance -changes in weight -cramping, dizziness, or faintness while the device is being inserted -headache -irregular menstrual bleeding within first 3 to 6 months of use -nausea This list may not describe all possible side effects. Call your doctor for medical advice about side effects. You may report side effects to FDA at 1-800-FDA-1088. Where should I keep my medicine? This does not apply. NOTE: This sheet is a summary. It may not cover all possible information. If you have questions about this medicine, talk to your doctor, pharmacist, or health care provider.    2016, Elsevier/Gold Standard. (2011-08-04 13:54:04) Medroxyprogesterone injection [Contraceptive] What is this medicine? MEDROXYPROGESTERONE (me DROX ee proe JES te rone) contraceptive injections prevent pregnancy. They provide effective birth control for 3 months. Depo-subQ Provera 104 is also used for treating pain related to endometriosis. This medicine may be used for other purposes; ask your health care provider or pharmacist if you have questions. What should I tell my health care provider before I take this medicine? They need to know if you have any of these conditions: -frequently drink alcohol -asthma -blood vessel disease or a history of a blood clot in the lungs or legs -bone disease such as  osteoporosis -breast cancer -diabetes -eating disorder (anorexia nervosa or bulimia) -high blood pressure -HIV infection or AIDS -kidney disease -liver disease -mental depression -migraine -seizures (convulsions) -stroke -tobacco smoker -vaginal bleeding -an unusual or allergic reaction to medroxyprogesterone, other hormones, medicines, foods, dyes, or preservatives -pregnant or trying to get pregnant -breast-feeding How should I use this medicine? Depo-Provera Contraceptive injection is given into a muscle. Depo-subQ Provera 104 injection is given under the skin. These injections are given by a health care professional. You must not be pregnant before getting an injection. The injection is usually given during the first 5 days after the start of a menstrual period or 6 weeks after delivery of a baby. Talk to your pediatrician regarding the use of this medicine in children. Special care may be needed. These injections have been used in female children who have started having menstrual periods. Overdosage: If you think you have taken too much of this medicine contact a poison control center or emergency room at once. NOTE: This medicine is only for you. Do not share this medicine with others. What if I miss a dose? Try not to miss a dose. You must get an injection once every 3 months to maintain birth control. If you cannot keep an appointment, call and reschedule it. If you wait longer than 13 weeks between Depo-Provera contraceptive injections or longer than 14 weeks between Depo-subQ Provera 104 injections, you could get pregnant. Use another method for birth control if you miss your appointment. You may also need a pregnancy test before receiving another injection. What may interact with this medicine? Do  not take this medicine with any of the following medications: -bosentan This medicine may also interact with the following medications: -aminoglutethimide -antibiotics or medicines  for infections, especially rifampin, rifabutin, rifapentine, and griseofulvin -aprepitant -barbiturate medicines such as phenobarbital or primidone -bexarotene -carbamazepine -medicines for seizures like ethotoin, felbamate, oxcarbazepine, phenytoin, topiramate -modafinil -St. John's wort This list may not describe all possible interactions. Give your health care provider a list of all the medicines, herbs, non-prescription drugs, or dietary supplements you use. Also tell them if you smoke, drink alcohol, or use illegal drugs. Some items may interact with your medicine. What should I watch for while using this medicine? This drug does not protect you against HIV infection (AIDS) or other sexually transmitted diseases. Use of this product may cause you to lose calcium from your bones. Loss of calcium may cause weak bones (osteoporosis). Only use this product for more than 2 years if other forms of birth control are not right for you. The longer you use this product for birth control the more likely you will be at risk for weak bones. Ask your health care professional how you can keep strong bones. You may have a change in bleeding pattern or irregular periods. Many females stop having periods while taking this drug. If you have received your injections on time, your chance of being pregnant is very low. If you think you may be pregnant, see your health care professional as soon as possible. Tell your health care professional if you want to get pregnant within the next year. The effect of this medicine may last a long time after you get your last injection. What side effects may I notice from receiving this medicine? Side effects that you should report to your doctor or health care professional as soon as possible: -allergic reactions like skin rash, itching or hives, swelling of the face, lips, or tongue -breast tenderness or discharge -breathing problems -changes in vision -depression -feeling  faint or lightheaded, falls -fever -pain in the abdomen, chest, groin, or leg -problems with balance, talking, walking -unusually weak or tired -yellowing of the eyes or skin Side effects that usually do not require medical attention (report to your doctor or health care professional if they continue or are bothersome): -acne -fluid retention and swelling -headache -irregular periods, spotting, or absent periods -temporary pain, itching, or skin reaction at site where injected -weight gain This list may not describe all possible side effects. Call your doctor for medical advice about side effects. You may report side effects to FDA at 1-800-FDA-1088. Where should I keep my medicine? This does not apply. The injection will be given to you by a health care professional. NOTE: This sheet is a summary. It may not cover all possible information. If you have questions about this medicine, talk to your doctor, pharmacist, or health care provider.    2016, Elsevier/Gold Standard. (2008-07-25 18:37:56)

## 2016-03-24 NOTE — Progress Notes (Signed)
History:  30 y.o. G4P3 here today for post op check after [facility] 03/09/2016.  Pt reports continued pelvic pressure since prior to her D&E. She denies pain or abnormal discharge. Pt reports receiving Doxycyline 2x in early pregnancy for UTIs she is worried that this caused her SAB  The following portions of the patient's history were reviewed and updated as appropriate: allergies, current medications, past family history, past medical history, past social history, past surgical history and problem list.  Review of Systems:  Pertinent items are noted in HPI.  Objective:  Physical Exam Blood pressure 114/72, pulse (!) 43, height 5\' 3"  (1.6 m), weight 129 lb (58.5 kg), last menstrual period 01/11/2016, unknown if currently breastfeeding. Gen: NAD Lungs: CTA CV: RRR Abd: Soft, nontender and nondistended Pelvic: Normal appearing external genitalia; normal appearing vaginal mucosa and cervix.  Normal discharge.  Small uterus, no other palpable masses, no uterine or adnexal tenderness  Labs and Imaging US Ob Transvaginal  Result Date: 03/02/2016 CLINICAL DATA:  Pelvic pain. First trimester pregnancy with inconclusive fetal viability. Gestational age by LMP of 7 weeks 2 days. EXAM: TRANSVAGINAL OB ULTRASOUND TECHNIQUE: Transvaginal ultrasound was performed for complete evaluation of the gestation as well as the maternal uterus, adnexal regions, and pelvic cul-de-sac. COMPARISON:  02/23/2016, 02/20/2016, 02/18/2016, and 02/16/2016 FINDINGS: Intrauterine gestational sac: Single, irregular in shape Yolk sac:  Not visualized Embryo: Indeterminate ; ovoid structure measuring 16 mm within gestational sac was not seen 8 days ago. This could represent blood clot (chorionic bump) versus abnormal embryo Cardiac Activity: None visualized MSD: 15  mm   6 w   2  d Subchorionic hemorrhage:  Moderate subchorionic hemorrhage seen. Maternal uterus/adnexae: Normal appearance of both ovaries. No adnexal mass identified. Trace  amount of simple free fluid noted in cul-de-sac. IMPRESSION: Interval enlargement of intrauterine gestational sac since previous study, with question of abnormal embryo or blood clot (chorionic bump). Findings are suspicious but not definitive for failed pregnancy. Recommend continued followup by ultrasound in 7-10 days. This recommendation follows SRU consensus guidelines: Diagnostic Criteria for Nonviable Pregnancy Early in the First Trimester. [place] Med [id number]. Moderate subchorionic hemorrhage.  No adnexal mass identified. Electronically Signed   By: Earle Gell M.D.   On: 03/02/2016 15:58   US Ob Transvaginal  Result Date: 02/23/2016 CLINICAL DATA:  30 year old pregnant female presents with several weeks of pelvic pressure and spotting. Quantitative beta HCG 5,285, compared to 2,321 when measured 3 days prior. EDC by LMP: 10/17/2016, projecting to an expected gestational age of [redacted] weeks 1 day. EXAM: TRANSVAGINAL OB ULTRASOUND TECHNIQUE: Transvaginal ultrasound was performed for complete evaluation of the gestation as well as the maternal uterus, adnexal regions, and pelvic cul-de-sac. COMPARISON:  02/20/2016 obstetric scan. FINDINGS: The anteverted anteflexed uterus measures 9.9 x 5.3 x 7.6 cm in size. There is concavity of the fundal endometrial contour, which could indicate a developmental uterine anomaly such as a subseptate uterus or arcuate configuration of the uterus. There are no uterine fibroids or other myometrial abnormalities. There is no definitive intrauterine gestational sac. There is a new irregular sac-like structure within the endometrial cavity measuring 1.0 x 0.3 x 1.1 cm (average diameter 0.8 cm), without internal yolk sac or embryo and without a definitive double gestational sac sign. There is additional complex fluid within the right uterine horn. The right ovary measures 4.4 x 2.7 x 3.9 cm and contains a corpus luteum. The left ovary measures 2.9 x 2.4 x 2.9 cm. No  suspicious ovarian or adnexal masses are demonstrated. No abnormal free fluid is seen in the pelvis. IMPRESSION: No definitive localization of the pregnancy on this scan. Irregular 0.8 cm sac-like structure within the endometrial cavity, newly visualized since the 02/20/2016 scan, with no yolk sac or embryo detected, which could represent a pseudo-gestational sac (decidual cyst) or an early gestational sac. No abnormal ovarian or adnexal masses. Sonographic differential diagnosis continues to include an early intrauterine gestation, spontaneous abortion or occult ectopic gestation. Recommend close clinical follow-up and serial serum beta HCG monitoring. A follow-up obstetric scan is recommended in 15-21 days or earlier as clinically warranted. Electronically Signed   By: Ilona Sorrel M.D.   On: 02/23/2016 16:34   [place] Intraoperative  Result Date: 03/09/2016 CLINICAL DATA:  Ultrasound was provided for use by the ordering physician, and a technical charge was applied by the performing facility.  No radiologist interpretation/professional services rendered.   03/09/2016 Diagnosis Products of Conception - CHORIONIC VILLI IDENTIFIED  Assessment & Plan:   D&C post op check- doing well  Contraception counseling- pt wants an IUD but, because she needs to wait 2-4 weeks she wants Depo Provera until that time.  Discussed SAB and its potential etiology  Depo Provera 150mg  IM x 1 F/u in 10 weeks for LnIUD  Kashawn Dirr L. Harraway-Smith, M.D., Cherlynn June

## 2016-04-12 ENCOUNTER — Ambulatory Visit: Payer: Medicaid Other | Admitting: Obstetrics and Gynecology

## 2016-04-12 NOTE — Telephone Encounter (Signed)
Opened in error

## 2016-04-13 ENCOUNTER — Encounter: Payer: Self-pay | Admitting: Obstetrics and Gynecology

## 2016-04-13 NOTE — Progress Notes (Signed)
Patient did not keep GYN appointment for 04/12/2016.  Durene Romans MD Attending Center for [employer])

## 2016-05-16 ENCOUNTER — Encounter: Payer: Self-pay | Admitting: Obstetrics & Gynecology

## 2016-05-16 ENCOUNTER — Ambulatory Visit (INDEPENDENT_AMBULATORY_CARE_PROVIDER_SITE_OTHER): Payer: Medicaid Other | Admitting: Obstetrics & Gynecology

## 2016-05-16 VITALS — BP 116/67 | HR 45 | Wt 128.5 lb

## 2016-05-16 DIAGNOSIS — R102 Pelvic and perineal pain: Secondary | ICD-10-CM | POA: Diagnosis not present

## 2016-05-16 MED ORDER — GABAPENTIN 300 MG PO CAPS: 300.0000 mg | ORAL_CAPSULE | Freq: Two times a day (BID) | ORAL | 2 refills | Status: DC

## 2016-05-16 NOTE — Patient Instructions (Signed)

## 2016-05-16 NOTE — Progress Notes (Signed)
Patient ID: Kristy Hunt, female   DOB: 22-May-1986, 30 y.o.   MRN: [id number]  Chief Complaint  Patient presents with  . Vaginal Pain    x 2-59months     HPI Kristy Hunt is a 30 y.o. female.  G4P3 No LMP recorded. Patient has had an injection. 4 months of occasional pelvic pain and vaginal irritation like pins and needles HPI  Past Medical History:  Diagnosis Date  . Bigeminal pulse   . Complication of anesthesia     Past Surgical History:  Procedure Laterality Date  . CHOLECYSTECTOMY    . DILATION AND EVACUATION N/A 03/09/2016   Procedure: DILATATION AND EVACUATION;  Surgeon: Lavonia Drafts, MD;  Location: [facility];  Service: Gynecology;  Laterality: N/A;  . FOOT SURGERY    . infection in foot removed     . TONSILLECTOMY      Family History  Problem Relation Age of Onset  . Endometriosis Mother   . Diabetes Maternal Grandfather   . Diabetes Paternal Grandmother     Social History Social History  Substance Use Topics  . Smoking status: Never Smoker  . Smokeless tobacco: Never Used  . Alcohol use No    Allergies  Allergen Reactions  . Dilaudid [Hydromorphone Hcl] Hives and Shortness Of Breath  . Morphine And Related Hives and Shortness Of Breath    Pt states she can tolerate Percocet    Current Outpatient Prescriptions  Medication Sig Dispense Refill  . ibuprofen (ADVIL,MOTRIN) 800 MG tablet Take 800 mg by mouth every 8 (eight) hours as needed for mild pain or moderate pain.    Marland Kitchen gabapentin (NEURONTIN) 300 MG capsule Take 1 capsule (300 mg total) by mouth 2 (two) times daily. 60 capsule 2   No current facility-administered medications for this visit.     Review of Systems Review of Systems  Constitutional: Negative.   Gastrointestinal: Negative.   Genitourinary: Positive for frequency, pelvic pain and vaginal pain. Negative for vaginal bleeding and vaginal discharge.    Blood pressure 116/67, pulse (!) 45, weight 128 lb 8 oz (58.3 kg),  unknown if currently breastfeeding.  Physical Exam Physical Exam  Constitutional: She is oriented to person, place, and time. She appears well-developed. No distress.  Cardiovascular: Normal rate.   Pulmonary/Chest: Effort normal.  Genitourinary: Vaginal discharge (white, wet prep sent) found.  Genitourinary Comments: Uterus and adnexa nl, mild s/p tenderness  Neurological: She is alert and oriented to person, place, and time.  Psychiatric: She has a normal mood and affect. Her behavior is normal.    Data Reviewed ED notes and urine cultures  Assessment    Pelvic and vaginal pain H/O UTI and urinary symptoms H/O mood d/o previously taking gabapentin    Plan    Reorder gabapentin Urine and culture today Wet prep result f/u RTC 4 weeks Consider urology referral       Emersen Mascari 05/16/2016, 8:28 AM

## 2016-05-17 LAB — WET PREP, GENITAL
Trich, Wet Prep: NONE SEEN
WBC, Wet Prep HPF POC: NONE SEEN
Yeast Wet Prep HPF POC: NONE SEEN

## 2016-05-17 LAB — URINE CULTURE

## 2016-05-24 ENCOUNTER — Telehealth: Payer: Self-pay

## 2016-05-24 MED ORDER — METRONIDAZOLE 500 MG PO TABS: 500.0000 mg | ORAL_TABLET | Freq: Two times a day (BID) | ORAL | 0 refills | Status: DC

## 2016-05-24 NOTE — Telephone Encounter (Signed)
Patient has BV per Dr.Arnold need to call in  flagyl 500 mg BID 7 days. Called patient no answer I have left a voicemail for patient to call us back regarding results. Medication has been sent to her pharmacy.

## 2016-11-08 ENCOUNTER — Ambulatory Visit (INDEPENDENT_AMBULATORY_CARE_PROVIDER_SITE_OTHER): Payer: Medicaid Other | Admitting: Obstetrics & Gynecology

## 2016-11-08 ENCOUNTER — Encounter: Payer: Self-pay | Admitting: Obstetrics & Gynecology

## 2016-11-08 VITALS — BP 125/65 | HR 74 | Ht 63.0 in | Wt 124.6 lb

## 2016-11-08 DIAGNOSIS — L709 Acne, unspecified: Secondary | ICD-10-CM | POA: Diagnosis not present

## 2016-11-08 DIAGNOSIS — N912 Amenorrhea, unspecified: Secondary | ICD-10-CM

## 2016-11-08 LAB — POCT URINALYSIS DIP (DEVICE)
Bilirubin Urine: NEGATIVE
Glucose, UA: NEGATIVE mg/dL
Hgb urine dipstick: NEGATIVE
Ketones, ur: NEGATIVE mg/dL
Leukocytes, UA: NEGATIVE
Nitrite: NEGATIVE
Protein, ur: NEGATIVE mg/dL
Specific Gravity, Urine: 1.02 (ref 1.005–1.030)
Urobilinogen, UA: 0.2 mg/dL (ref 0.0–1.0)
pH: 7.5 (ref 5.0–8.0)

## 2016-11-08 NOTE — Patient Instructions (Signed)
Acne Acne is a skin problem that causes pimples. Acne occurs when the pores in the skin get blocked. The pores may become infected with bacteria, or they may become red, sore, and swollen. Acne is a common skin problem, especially for teenagers. Acne usually goes away over time. What are the causes? Each pore contains an oil gland. Oil glands make an oily substance that is called sebum. Acne happens when these glands get plugged with sebum, dead skin cells, and dirt. Then, the bacteria that are normally found in the oil glands multiply and cause inflammation. Acne is commonly triggered by changes in your hormones. These hormonal changes can cause the oil glands to get bigger and to make more sebum. Factors that can make acne worse include:  Hormone changes during:  Adolescence.  Women's menstrual cycles.  Pregnancy.  Oil-based cosmetics and hair products.  Harshly scrubbing the skin.  Strong soaps.  Stress.  Hormone problems that are due to certain diseases.  Long or oily hair rubbing against the skin.  Certain medicines.  Pressure from headbands, backpacks, or shoulder pads.  Exposure to certain oils and chemicals. What increases the risk? This condition is more likely to develop in:  Teenagers.  People who have a family history of acne. What are the signs or symptoms? Acne often occurs on the face, neck, chest, and upper back. Symptoms include:  Small, red bumps (pimples or papules).  Whiteheads.  Blackheads.  Small, pus-filled pimples (pustules).  Big, red pimples or pustules that feel tender. More severe acne can cause:  An infected area that contains a collection of pus (abscess).  Hard, painful, fluid-filled sacs (cysts).  Scars. How is this diagnosed? This condition is diagnosed with a medical history and physical exam. Blood tests may also be done. How is this treated? Treatment for this condition can vary depending on the severity of your acne.  Treatment may include:  Creams and lotions that prevent oil glands from clogging.  Creams and lotions that treat or prevent infections and inflammation.  Antibiotic medicines that are applied to the skin or taken as a pill.  Pills that decrease sebum production.  Birth control pills.  Light or laser treatments.  Surgery.  Injections of medicine into the affected areas.  Chemicals that cause peeling of the skin. Your health care provider will also recommend the best way to take care of your skin. Good skin care is the most important part of treatment. Follow these instructions at home: Skin care  Take care of your skin as told by your health care provider. You may be told to do these things:  Wash your skin gently at least two times each day, as well as:  After you exercise.  Before you go to bed.  Use mild soap.  Apply a water-based skin moisturizer after you wash your skin.  Use a sunscreen or sunblock with SPF 30 or greater. This is especially important if you are using acne medicines.  Choose cosmetics that will not plug your oil glands (are noncomedogenic). Medicines   Take over-the-counter and prescription medicines only as told by your health care provider.  If you were prescribed an antibiotic medicine, apply or take it as told by your health care provider. Do not stop taking the antibiotic even if your condition improves. General instructions   Keep your hair clean and off of your face. If you have oily hair, shampoo your hair regularly or daily.  Avoid leaning your chin or forehead against your  hands.  Avoid wearing tight headbands or hats.  Avoid picking or squeezing your pimples. That can make your acne worse and cause scarring.  Keep all follow-up visits as told by your health care provider. This is important.  Shave gently and only when necessary.  Keep a food journal to figure out if any foods are linked with your acne. Contact a health care  provider if:  Your acne is not better after eight weeks.  Your acne gets worse.  You have a large area of skin that is red or tender.  You think that you are having side effects from any acne medicine. This information is not intended to replace advice given to you by your health care provider. Make sure you discuss any questions you have with your health care provider. Document Released: 07/01/2000 Document Revised: 03/04/2016 Document Reviewed: 09/10/2014 Elsevier Interactive Patient Education  2017 [place] Peroxide skin cream, gel or lotion What is this medicine? BENZOYL PEROXIDE (BEN zoe ill per OX ide) is used on the skin to treat mild to moderate acne. This medicine may be used for other purposes; ask your health care provider or pharmacist if you have questions. COMMON BRAND NAME(S): Acne Medication, Acne-10, Acneclear, Benprox, Benzac, Benzac AC, Benzac W, Benzagel, BenzaShave, BenzEFoam, BenzEFoam Ultra, BenzePrO, Benziq, Benziq LS, BP Cleansing Lotion, BP Gel, BP Topical, BPO, Brevoxyl-4, Brevoxyl-8, Clearasil, Clearasil Ultra, Clearasil Vanishing, Clearplex, Clearplex X, Clearskin, Clinac BPO, [place], [place], [place], [place], [place], [place] Acne Wash Kit, Lav[place]-8 Acne Wash Kit, NeoBenz, [employer], [employer] Pack, [place], OC8, [place], PanOxyl, PanOxyl AQ, PanOxyl Aqua, RE Benzoyl Peroxide, Riax, Seba, Seba-Gel, Soluclenz Rx, Theroxide, Triaz, Zoderm What should I tell my health care provider before I take this medicine? They need to know if you have any of these conditions: -asthma -skin disease, abrasions, irritation or infection -sunburn -an unusual or allergic reaction to benzoic acid, cinnamon, parabens, sulfites, other medicines, foods, dyes, or preservatives -pregnant or trying to get pregnant -breast-feeding How should I use this medicine? This medicine is for external use only. Do not take by mouth. Follow the  directions on the prescription label. Before using, wash affected area with a gentle cleanser and pat dry. Do not apply to raw or irritated skin. Apply enough medicine to cover the area and rub in gently. Avoid getting medicine in your eyes, lips, nose, mouth, or other sensitive areas. Do not wash treated areas of skin for at least 1 hour after using the medicine. If you experience very dry and peeling skin or skin irritation, talk to your doctor or health care professional. Talk to your pediatrician or health care professional regarding the use of this medicine in children. Special care may be needed. Overdosage: If you think you have taken too much of this medicine contact a poison control center or emergency room at once. NOTE: This medicine is only for you. Do not share this medicine with others. What if I miss a dose? If you miss a dose, use it as soon as you can. If it is almost time for your next dose, use only that dose. Do not use double or extra doses. What may interact with this medicine? -adapalene -isotretinoin -salicylic acid or sulfur containing products -topical antibiotics such as clindamycin or erythromycin -tretinoin This list may not describe all possible interactions. Give your health care provider a list of all the medicines, herbs, non-prescription drugs, or dietary supplements you use. Also tell them if you  smoke, drink alcohol, or use illegal drugs. Some items may interact with your medicine. What should I watch for while using this medicine? Your acne may get worse during the first few weeks of treatment, and then start to get better. It may take 8 to 12 weeks before you see the full effect. If you do not see any improvement within 4 to 6 weeks, call your doctor or health care professional. Once you see a decrease in your acne, you may need to continue to use this medicine to control it. Do not use products that may dry the skin like medicated cosmetics, products that  contain alcohol, or abrasive soaps or cleaners. Do not use other acne or skin treatment on the same area that you use this medicine unless your doctor or health care professional tells you to. If you use these together they can cause severe skin irritation. This medicine can make you more sensitive to the sun. Keep out of the sun. If you cannot avoid being in the sun, wear protective clothing and use sunscreen. Do not use sun lamps or tanning beds/booths. This medicine may bleach hair or colored fabrics. Avoid getting the medicine on your clothes. What side effects may I notice from receiving this medicine? Side effects that you should report to your doctor or health care professional as soon as possible: -allergic reactions like skin rash, itching or hives, swelling of the face, lips, or tongue -severe burning, itching, reddening, crusting, or swelling of the treated areas Side effects that usually do not require medical attention (report to your doctor or health care professional if they continue or are bothersome): -increased sensitivity to the sun -mild burning or stinging of the treated areas -red, inflamed, and irritated skin This list may not describe all possible side effects. Call your doctor for medical advice about side effects. You may report side effects to FDA at 1-800-FDA-1088. Where should I keep my medicine? Keep out of the reach of children. Store at room temperature between 15 and 30 degrees C (59 and 86 degrees F). Throw away any unused medication after the expiration date. NOTE: This sheet is a summary. It may not cover all possible information. If you have questions about this medicine, talk to your doctor, pharmacist, or health care provider.  2018 Elsevier/Gold Standard (9470-96-28 36:62:94) Salicylic Acid pads, wash What is this medicine? SALICYCLIC ACID (SAL i SIL ik AS id) is a skin cleanser. It is used to treat and prevent acne. This medicine may be used for other  purposes; ask your health care provider or pharmacist if you have questions. COMMON BRAND NAME(S): Clearasil 3-in-1, Clearasil Blackhead Clearing Scrub, Clearasil Oil Control, Clearasil Ultra Astringent, Neutrogena Acne Wash, [place], Salvax What should I tell my health care provider before I take this medicine? They need to know if you have any of these conditions: -kidney disease -liver disease -an unusual or allergic reaction to salicylic acid, other medicines, foods, dyes, or preservatives -pregnant or trying to get pregnant -breast-feeding How should I use this medicine? This medicine is for external use only. Do not take by mouth. Follow the directions on the label. Do not apply to raw or irritated skin. Avoid getting medicine in your eyes, lips, nose, mouth, or other sensitive areas. Use this medicine at regular intervals. Do not use more often than directed. Talk to your pediatrician regarding the use of this medicine in children. Special care may be needed. Overdosage: If you think you have taken too much of  this medicine contact a poison control center or emergency room at once. NOTE: This medicine is only for you. Do not share this medicine with others. What if I miss a dose? If you miss a dose, use it as soon as you can. If it is almost time for your next dose, use only that dose. Do not use double or extra doses. What may interact with this medicine? -other medicines for acne This list may not describe all possible interactions. Give your health care provider a list of all the medicines, herbs, non-prescription drugs, or dietary supplements you use. Also tell them if you smoke, drink alcohol, or use illegal drugs. Some items may interact with your medicine. What should I watch for while using this medicine? Tell your doctor or healthcare professional if your symptoms do not start to get better or if they get worse. This medicine can make you more sensitive to the sun. Keep out of  the sun. If you cannot avoid being in the sun, wear protective clothing and use sunscreen. Do not use sun lamps or tanning beds/booths. What side effects may I notice from receiving this medicine? Side effects that you should report to your doctor or health care professional as soon as possible: -allergic reactions like skin rash, itching or hives, swelling of the face, lips, or tongue Side effects that usually do not require medical attention (report to your doctor or health care professional if they continue or are bothersome): -skin irritation This list may not describe all possible side effects. Call your doctor for medical advice about side effects. You may report side effects to FDA at 1-800-FDA-1088. Where should I keep my medicine? Keep out of the reach of children. Store at room temperature between 15 and 30 degrees C (59 and 86 degrees F). Keep container tightly closed. Throw away any unused medicine after the expiration date. NOTE: This sheet is a summary. It may not cover all possible information. If you have questions about this medicine, talk to your doctor, pharmacist, or health care provider.  2018 Elsevier/Gold Standard (2008-03-07 13:37:44)

## 2016-11-08 NOTE — Progress Notes (Signed)
History:  31 y.o. [id number] here today for AUB. Sept 2017 pt had her last Depo Provera injection but, she still had not had menses. She is interested in conceiving in the future.  She is not having any adverse sx.  She c/o acne that is new in onset. She denies any new meds. She does not have a primary care provider.   The following portions of the patient's history were reviewed and updated as appropriate: allergies, current medications, past family history, past medical history, past social history, past surgical history and problem list.  Review of Systems:  Pertinent items are noted in HPI.   Objective:  Physical Exam Blood pressure 125/65, pulse 74, height 5\' 3"  (1.6 m), weight 124 lb 9.6 oz (56.5 kg), unknown if currently breastfeeding. BP 125/65   Pulse 74   Ht 5\' 3"  (1.6 m)   Wt 124 lb 9.6 oz (56.5 kg)   BMI 22.07 kg/m  CONSTITUTIONAL: Well-developed, well-nourished female in no acute distress.  HENT:  Normocephalic, atraumatic. POt had some acnes over her face over her forehead dn checks and chin. A few of the lesion appear to be pustular in nature.   EYES: Conjunctivae and EOM are normal. No scleral icterus.  NECK: Normal range of motion SKIN: Skin is warm and dry. No rash noted. Not diaphoretic.No pallor. [place]: Alert and oriented to person, place, and time. Normal coordination.    Assessment & Plan:  Amenorrhea. Prev on Depo Provera.  Pt left without obtaining a UPT.  I have reviewed with pt that it is not unusual to be without menses 4 months after  Depo completed.     Acne-  Benzoyl peroxide bid  Hydrocortisome .5%- 1% bid  Neutrogenia acne wash  Total face-to-face time with patient was 15** min.  Greater than 50% was spent in counseling and coordination of care with the patient.  Vida Nicol L. Harraway-Smith, M.D., Cherlynn June

## 2016-11-10 ENCOUNTER — Encounter: Payer: Self-pay | Admitting: Obstetrics & Gynecology

## 2016-12-04 DIAGNOSIS — Z79899 Other long term (current) drug therapy: Secondary | ICD-10-CM | POA: Diagnosis not present

## 2016-12-04 DIAGNOSIS — G43909 Migraine, unspecified, not intractable, without status migrainosus: Secondary | ICD-10-CM | POA: Insufficient documentation

## 2016-12-04 DIAGNOSIS — Z7982 Long term (current) use of aspirin: Secondary | ICD-10-CM | POA: Insufficient documentation

## 2016-12-04 DIAGNOSIS — R51 Headache: Secondary | ICD-10-CM | POA: Diagnosis present

## 2016-12-05 ENCOUNTER — Emergency Department (HOSPITAL_COMMUNITY)
Admission: EM | Admit: 2016-12-05 | Discharge: 2016-12-05 | Disposition: A | Discharge status: Home / Self Care | Payer: Medicaid Other | Attending: Emergency Medicine | Admitting: Emergency Medicine

## 2016-12-05 ENCOUNTER — Encounter (HOSPITAL_COMMUNITY): Payer: Self-pay

## 2016-12-05 DIAGNOSIS — G43909 Migraine, unspecified, not intractable, without status migrainosus: Secondary | ICD-10-CM

## 2016-12-05 HISTORY — DX: Migraine, unspecified, not intractable, without status migrainosus: G43.909

## 2016-12-05 LAB — URINALYSIS, ROUTINE W REFLEX MICROSCOPIC
Bacteria, UA: NONE SEEN
Bilirubin Urine: NEGATIVE
Glucose, UA: NEGATIVE mg/dL
Ketones, ur: NEGATIVE mg/dL
Nitrite: NEGATIVE
Protein, ur: 30 mg/dL — AB
RBC / HPF: NONE SEEN RBC/hpf (ref 0–5)
Specific Gravity, Urine: 1.02 (ref 1.005–1.030)
pH: 7 (ref 5.0–8.0)

## 2016-12-05 LAB — PREGNANCY, URINE: Preg Test, Ur: NEGATIVE

## 2016-12-05 MED ORDER — METOCLOPRAMIDE HCL 10 MG PO TABS
10.0000 mg | ORAL_TABLET | Freq: Once | ORAL | Status: AC
  Administered 2016-12-05: 10 mg via ORAL
  Filled 2016-12-05: qty 1

## 2016-12-05 MED ORDER — SODIUM CHLORIDE 0.9 % IV BOLUS (SEPSIS)
1000.0000 mL | Freq: Once | INTRAVENOUS | Status: AC
  Administered 2016-12-05: 1000 mL via INTRAVENOUS

## 2016-12-05 MED ORDER — FLUCONAZOLE 150 MG PO TABS
150.0000 mg | ORAL_TABLET | Freq: Once | ORAL | Status: AC
  Administered 2016-12-05: 150 mg via ORAL
  Filled 2016-12-05: qty 1

## 2016-12-05 MED ORDER — DIPHENHYDRAMINE HCL 25 MG PO CAPS
25.0000 mg | ORAL_CAPSULE | Freq: Once | ORAL | Status: AC
  Administered 2016-12-05: 25 mg via ORAL
  Filled 2016-12-05: qty 1

## 2016-12-05 MED ORDER — KETOROLAC TROMETHAMINE 30 MG/ML IJ SOLN
30.0000 mg | Freq: Once | INTRAMUSCULAR | Status: AC
  Administered 2016-12-05: 30 mg via INTRAVENOUS
  Filled 2016-12-05: qty 1

## 2016-12-05 NOTE — ED Provider Notes (Signed)
[facility] DEPT Provider Note   CSN: [id number] Arrival date & time: 12/04/16  2350  By signing my name below, I, Mayer Masker, attest that this documentation has been prepared under the direction and in the presence of [employer], PA-C. Electronically Signed: Mayer Masker, Scribe. 12/05/16. 12:37 AM.   History   Chief Complaint Chief Complaint  Patient presents with  . Headache   The history is provided by the patient. No language interpreter was used.   HPI Comments:  Kristy Hunt is a 31 y.o. female with a PMHx and FMHx of migraines who presents to the Emergency Department complaining of a constant, gradually worsening headache since yesterday. She has associated nausea, photophobia, sensitivity to sound, and left-eye vision changes. She notes her vision changes as a blurriness, but this is common when she has migraines. She states she has tried using peppermint essential oils, ibuprofen, BC powders with no relief. Her symptoms are similar to when she has had migraines previously before. She denies numbness/tingling.  She also notes she was taking Depo since September but has not used it since November. Her last period was last year and she notes she has been spotting with increased urinary frequency.    Past Medical History:  Diagnosis Date  . Bigeminal pulse   . Complication of anesthesia   . Migraine     Patient Active Problem List   Diagnosis Date Noted  . Pelvic pain in female 05/16/2016  . Missed abortion     Past Surgical History:  Procedure Laterality Date  . CHOLECYSTECTOMY    . DILATION AND EVACUATION N/A 03/09/2016   Procedure: DILATATION AND EVACUATION;  Surgeon: Lavonia Drafts, MD;  Location: [facility];  Service: Gynecology;  Laterality: N/A;  . FOOT SURGERY    . infection in foot removed     . TONSILLECTOMY      OB History    Gravida Para Term Preterm AB Living   4 3       3    SAB TAB Ectopic Multiple Live Births                    Home Medications    Prior to Admission medications   Medication Sig Start Date End Date Taking? Authorizing Provider  Aspirin-Salicylamide-Caffeine (BC HEADACHE POWDER PO) Take 1 each by mouth every 6 (six) hours as needed (migraine).    Yes [provider]  ibuprofen (ADVIL,MOTRIN) 200 MG tablet Take 600 mg by mouth every 6 (six) hours as needed for headache, mild pain or moderate pain.   Yes [provider]  gabapentin (NEURONTIN) 300 MG capsule Take 1 capsule (300 mg total) by mouth 2 (two) times daily. Patient not taking: Reported on 11/08/2016 05/16/16   Woodroe Mode, MD  metroNIDAZOLE (FLAGYL) 500 MG tablet Take 1 tablet (500 mg total) by mouth 2 (two) times daily. Patient not taking: Reported on 11/08/2016 05/24/16   Woodroe Mode, MD    Family History Family History  Problem Relation Age of Onset  . Endometriosis Mother   . Diabetes Maternal Grandfather   . Diabetes Paternal Grandmother     Social History Social History  Substance Use Topics  . Smoking status: Never Smoker  . Smokeless tobacco: Never Used  . Alcohol use No     Allergies   Dilaudid [hydromorphone hcl] and Morphine and related   Review of Systems Review of Systems  HENT:       Sensitivity to sound  Eyes:  Positive for photophobia and visual disturbance (left-eye).  Gastrointestinal: Positive for nausea.  Genitourinary: Positive for frequency.  Neurological: Positive for headaches. Negative for numbness.     Physical Exam Updated Vital Signs BP 101/65 (BP Location: Left Arm)   Pulse 72   Temp 98 F (36.7 C) (Oral)   Resp 18   SpO2 98%   Physical Exam  Constitutional: She is oriented to person, place, and time. She appears well-developed and well-nourished.  HENT:  Head: Normocephalic and atraumatic.  Cardiovascular: Normal rate.   Pulmonary/Chest: Effort normal.  Neurological: She is alert and oriented to person, place, and time. She has normal strength. No  cranial nerve deficit or sensory deficit. She displays a negative Romberg sign. GCS eye subscore is 4. GCS verbal subscore is 5. GCS motor subscore is 6.  Extraocular movements in tact, pain free Vision normal, no visual field deficits  Skin: Skin is warm and dry.  Psychiatric: She has a normal mood and affect.  Nursing note and vitals reviewed.    ED Treatments / Results  DIAGNOSTIC STUDIES: Oxygen Saturation is 100% on RA, normal by my interpretation.    COORDINATION OF CARE: 12:28 AM Discussed treatment plan with pt at bedside and pt agreed to plan.  Labs (all labs ordered are listed, but only abnormal results are displayed) Labs Reviewed  URINALYSIS, ROUTINE W REFLEX MICROSCOPIC - Abnormal; Notable for the following:       Result Value   APPearance TURBID (*)    Hgb urine dipstick SMALL (*)    Protein, ur 30 (*)    Leukocytes, UA TRACE (*)    Squamous Epithelial / LPF 6-30 (*)    All other components within normal limits  URINE CULTURE  PREGNANCY, URINE    EKG  EKG Interpretation None       Radiology No results found.  Procedures Procedures (including critical care time)  Medications Ordered in ED Medications  sodium chloride 0.9 % bolus 1,000 mL (0 mLs Intravenous Stopped 12/05/16 0319)  ketorolac (TORADOL) 30 MG/ML injection 30 mg (30 mg Intravenous Given 12/05/16 0109)  metoCLOPramide (REGLAN) tablet 10 mg (10 mg Oral Given 12/05/16 0109)  diphenhydrAMINE (BENADRYL) capsule 25 mg (25 mg Oral Given 12/05/16 0109)  fluconazole (DIFLUCAN) tablet 150 mg (150 mg Oral Given 12/05/16 0318)     Initial Impression / Assessment and Plan / ED Course  I have reviewed the triage vital signs and the nursing notes.  Pertinent labs & imaging results that were available during my care of the patient were reviewed by me and considered in my medical decision making (see chart for details).      Final Clinical Impressions(s) / ED Diagnoses   Final diagnoses:  Migraine  without status migrainosus, not intractable, unspecified migraine type    31 year old female presents today with complaints of migraine. This is similar to previous. Patient is neurovascularly intact with no comp getting features are red flags. She notes some blurriness in her left eye, no complaints at the time my evaluation, she reports this is usual with her onset of migraines. She's had imaging studies in the past that were normal per patient. For further imaging here. Pain completely resolved with medication.  Patient also having urinary frequency. Her urinalysis questionable for urinary tract infection as she has "blood cells with no bacteria. This is not a great sample. She does have yeast, and will be treated with fluconazole here. Urine culture will be sent. I would appreciate assistance from pharmacy  if culture shows growth to touch base with patient and asked if she is still having symptoms. Patient given follow-up information, return precautions. She verbalizes understanding and agreement to today's plan and no further questions or concerns.  New Prescriptions New Prescriptions   No medications on file   I personally performed the services described in this documentation, which was scribed in my presence. The recorded information has been reviewed and is accurate.    Okey Regal, PA-C 12/05/16 0340    Ripley Fraise, MD 12/05/16 [phone]

## 2016-12-05 NOTE — ED Triage Notes (Signed)
States headache cant see out of left eye with nausea and vomiting states worse than typical migraine with photophobia clear speech noted moves all extremities.

## 2016-12-05 NOTE — Discharge Instructions (Signed)
Please read attached information. If you experience any new or worsening signs or symptoms please return to the emergency room for evaluation. Please follow-up with your primary care provider or specialist as discussed. Please use medication prescribed only as directed and discontinue taking if you have any concerning signs or symptoms.   °

## 2016-12-06 LAB — URINE CULTURE: Culture: >=100000 — AB

## 2016-12-07 ENCOUNTER — Telehealth: Payer: Self-pay | Admitting: Emergency Medicine

## 2016-12-07 NOTE — Telephone Encounter (Signed)
Post ED Visit - Positive Culture Follow-up  Culture report reviewed by antimicrobial stewardship pharmacist:  []  Elenor Quinones, Pharm.D. []  Heide Guile, Pharm.D., BCPS AQ-ID []  Parks Neptune, Pharm.D., BCPS []  Alycia Rossetti, Pharm.D., BCPS []  Proctor, Pharm.D., BCPS, AAHIVP []  Legrand Como, Pharm.D., BCPS, AAHIVP []  Salome Arnt, PharmD, BCPS []  Dimitri Ped, PharmD, BCPS []  Vincenza Hews, PharmD, BCPS  Positive urine culture Treated with none,asymptomatic, no further patient follow-up is required at this time.  Hazle Nordmann 12/07/2016, 11:03 AM

## 2017-01-05 IMAGING — US US OB TRANSVAGINAL
1 series · 15 of 28 positions shown · non-contrast
Comparison: 02/23/2016, 02/20/2016, 02/18/2016, and 02/16/2016

CLINICAL DATA: Pelvic pain. First trimester pregnancy with
inconclusive fetal viability. Gestational age by LMP of 7 weeks 2
days.

EXAM:
TRANSVAGINAL OB ULTRASOUND
TECHNIQUE: Transvaginal ultrasound was performed for complete evaluation of the
gestation as well as the maternal uterus, adnexal regions, and
pelvic cul-de-sac.

[Series 1: us ob transvaginal · 38 acquisitions, 15 frames shown]
[im 1/38]
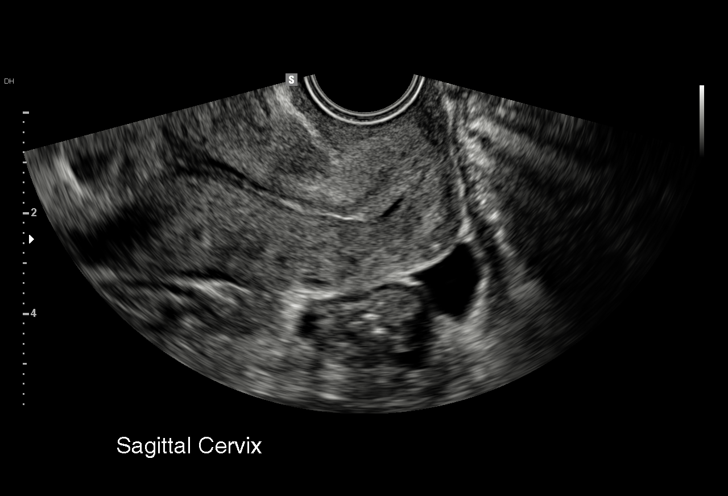
[im 3/38]
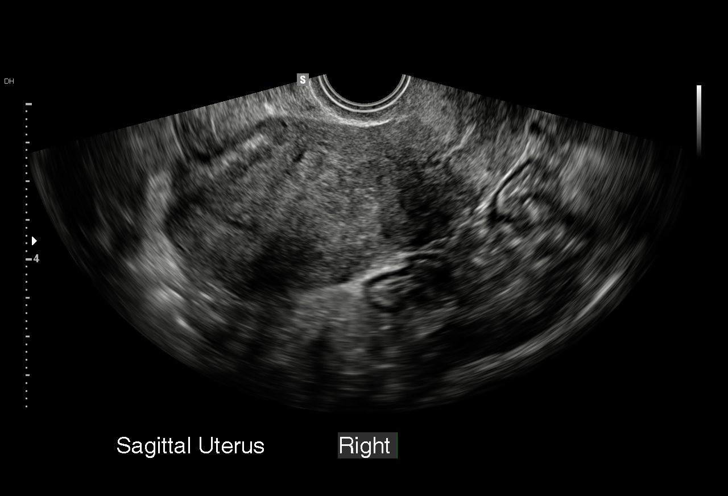
[im 6/38]
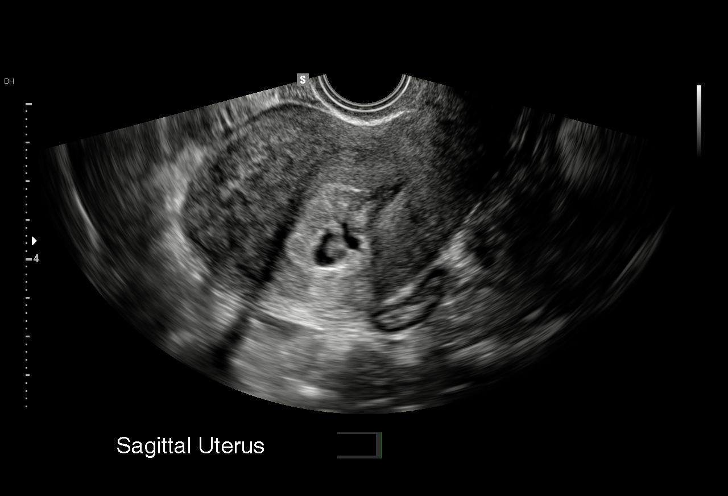
[im 9/38]
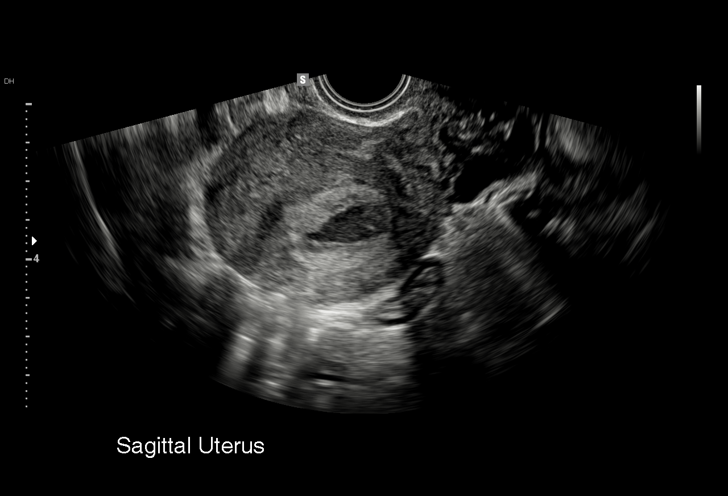
[im 11/38]
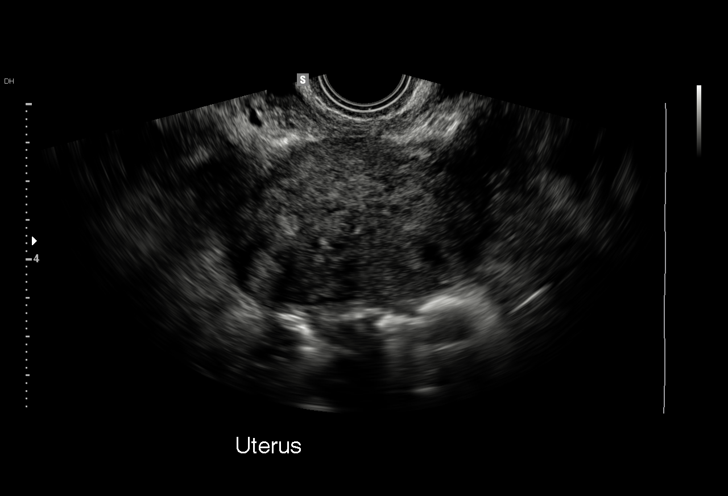
[im 14/38]
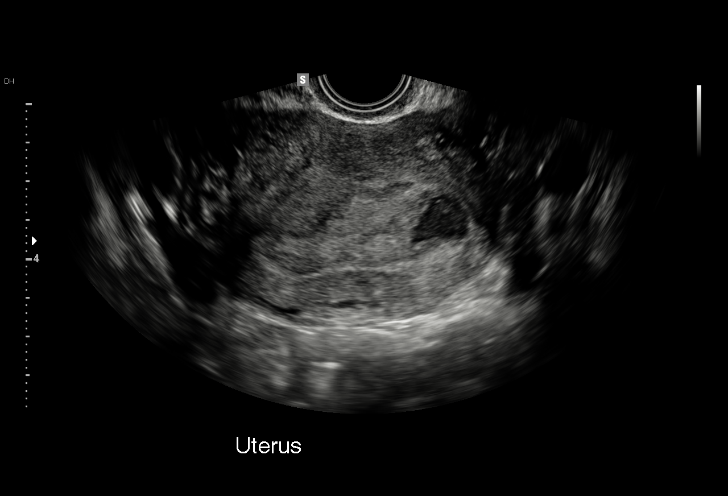
[im 17/38]
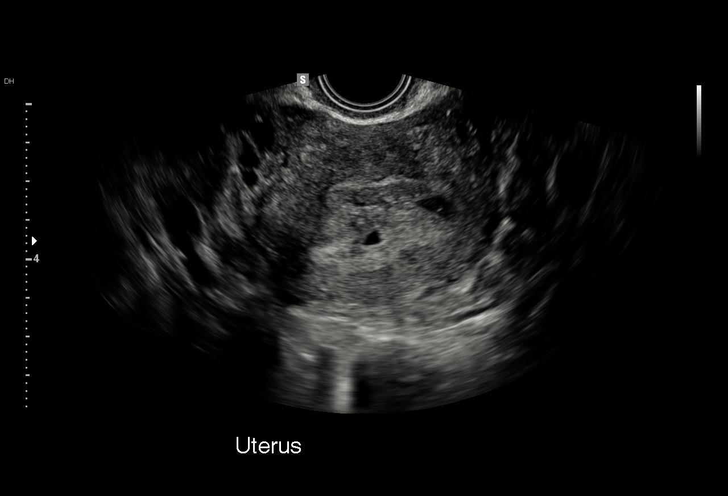
[im 20/38]
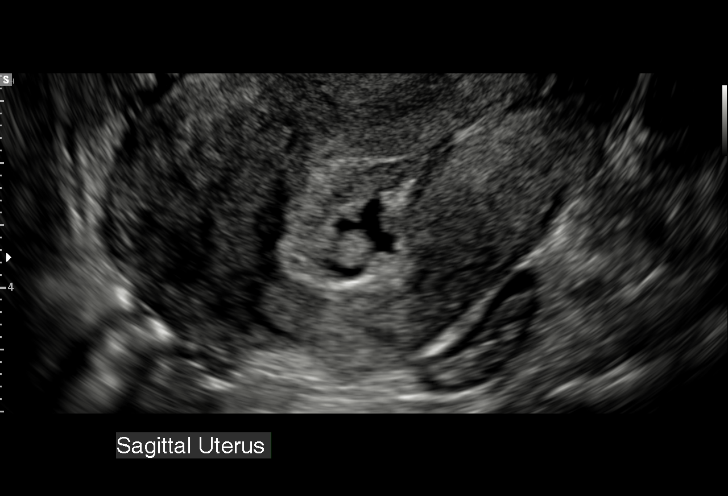
[im 21/38]
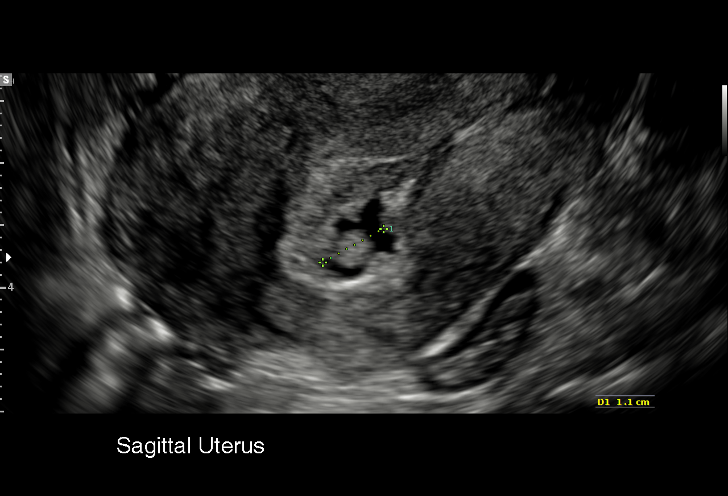
[im 24/38]
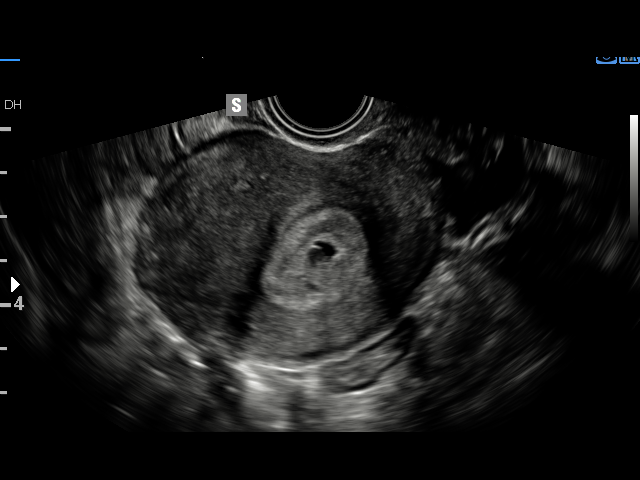
[im 27/38]
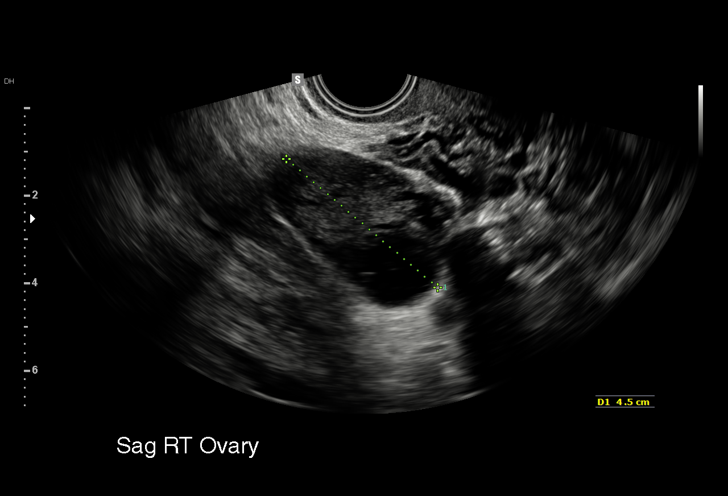
[im 29/38]
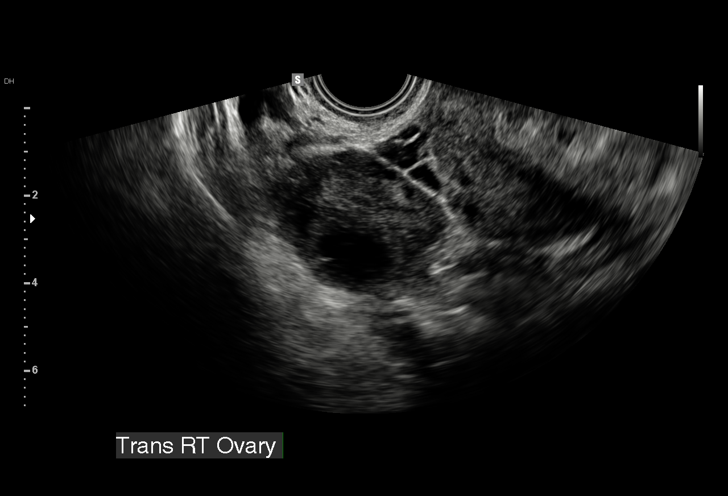
[im 32/38]
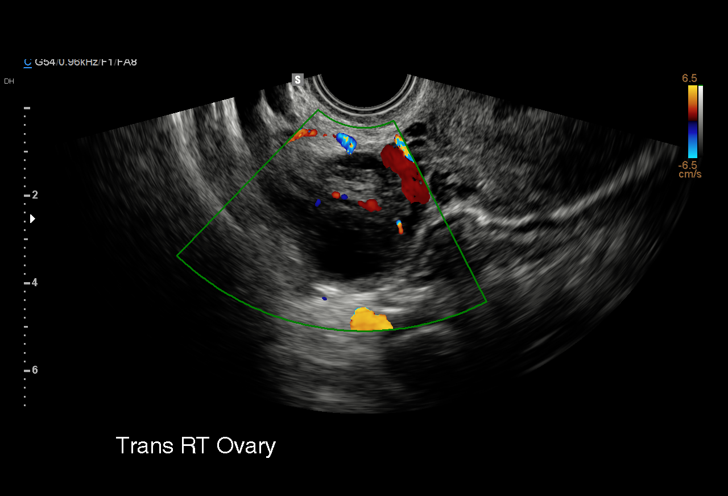
[im 35/38]
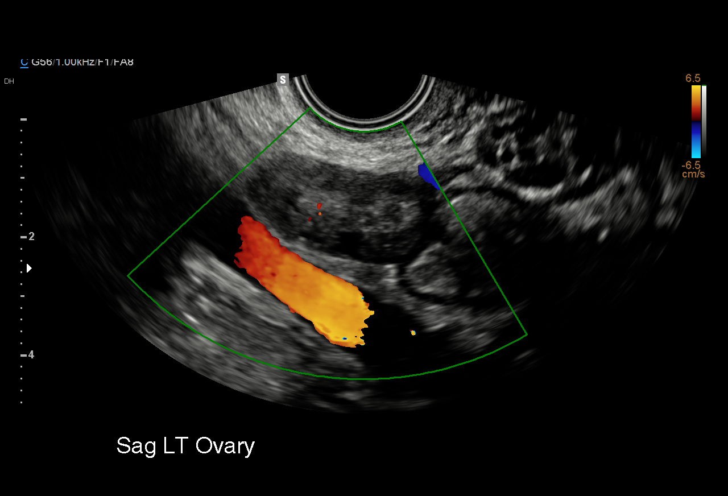
[im 38/38]
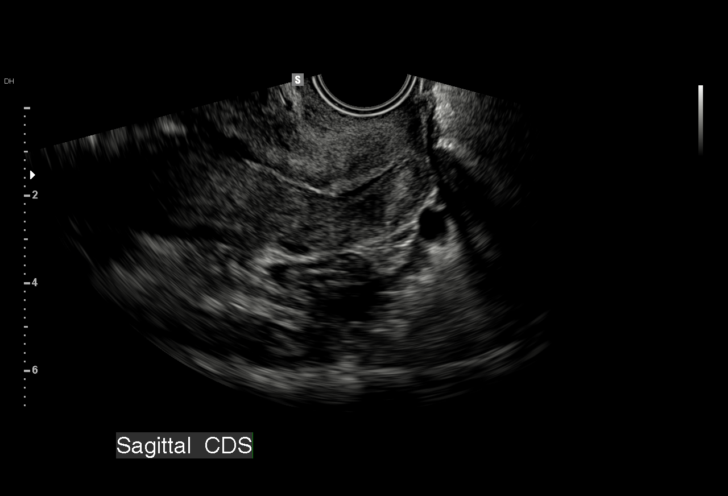

[15 of 28 positions shown; findings below may reference images not displayed]

FINDINGS: Intrauterine gestational sac: Single, irregular in shape

Yolk sac:  Not visualized

Embryo: Indeterminate ; ovoid structure measuring 16 mm within
gestational sac was not seen 8 days ago. This could represent blood
clot (chorionic bump) versus abnormal embryo

Cardiac Activity: None visualized

MSD: 15  mm   6 w   2  d

Subchorionic hemorrhage:  Moderate subchorionic hemorrhage seen.

Maternal uterus/adnexae: Normal appearance of both ovaries. No
adnexal mass identified. Trace amount of simple free fluid noted in
cul-de-sac.
IMPRESSION: Interval enlargement of intrauterine gestational sac since previous
study, with question of abnormal embryo or blood clot (chorionic
bump). Findings are suspicious but not definitive for failed
pregnancy. Recommend continued followup by ultrasound in 7-10 days.
This recommendation follows SRU consensus guidelines: Diagnostic
Criteria for Nonviable Pregnancy Early in the First Trimester. N
Engl J Med 6055; [DATE].

Moderate subchorionic hemorrhage.  No adnexal mass identified.

## 2017-04-25 ENCOUNTER — Other Ambulatory Visit: Payer: Self-pay | Admitting: Obstetrics & Gynecology

## 2017-04-26 NOTE — Telephone Encounter (Signed)
She will need f/u appt to continue refills

## 2017-06-25 ENCOUNTER — Other Ambulatory Visit: Payer: Self-pay | Admitting: Obstetrics & Gynecology

## 2017-07-26 ENCOUNTER — Other Ambulatory Visit: Payer: Self-pay | Admitting: Obstetrics & Gynecology

## 2017-07-27 ENCOUNTER — Ambulatory Visit (INDEPENDENT_AMBULATORY_CARE_PROVIDER_SITE_OTHER): Payer: Medicaid Other | Admitting: Obstetrics & Gynecology

## 2017-07-27 ENCOUNTER — Encounter: Payer: Self-pay | Admitting: Obstetrics & Gynecology

## 2017-07-27 VITALS — BP 114/61 | HR 55 | Ht 63.0 in | Wt 136.3 lb

## 2017-07-27 DIAGNOSIS — N911 Secondary amenorrhea: Secondary | ICD-10-CM | POA: Diagnosis present

## 2017-07-27 DIAGNOSIS — Z3202 Encounter for pregnancy test, result negative: Secondary | ICD-10-CM | POA: Diagnosis present

## 2017-07-27 LAB — POCT PREGNANCY, URINE: Preg Test, Ur: NEGATIVE

## 2017-07-27 MED ORDER — GABAPENTIN 300 MG PO CAPS: 300.0000 mg | ORAL_CAPSULE | Freq: Two times a day (BID) | ORAL | 3 refills | Status: DC

## 2017-07-27 MED ORDER — NORGESTIMATE-ETH ESTRADIOL 0.25-35 MG-MCG PO TABS: 1.0000 | ORAL_TABLET | Freq: Every day | ORAL | 11 refills | Status: DC

## 2017-07-27 NOTE — Progress Notes (Signed)
History:  32 y.o. G4P3 here today for LMP   Last Depo Provera was Oct 2017. Pt had MAB with D&C and got deppor vera just after the D&C. Since the Depo pt did not have much beelding. Only occ spotting. Pt not excessively worried but, she wants to make sure she's ok.  She has no other assoc sx.    The following portions of the patient's history were reviewed and updated as appropriate: allergies, current medications, past family history, past medical history, past social history, past surgical history and problem list.  Review of Systems:  Pertinent items are noted in HPI.   Objective:  Physical Exam BP 114/61   Pulse (!) 55   Ht 5\' 3"  (1.6 m)   Wt 136 lb 4.8 oz (61.8 kg)   BMI 24.14 kg/m  CONSTITUTIONAL: Well-developed, well-nourished female in no acute distress.  HENT:  Normocephalic, atraumatic EYES: Conjunctivae and EOM are normal. No scleral icterus.  NECK: Normal range of motion SKIN: Skin is warm and dry. No rash noted. Not diaphoretic.No pallor. [place]: Alert and oriented to person, place, and time. Normal coordination.   Labs and Imaging UPT- neg  Assessment & Plan:  2nd amenorrhea> Pt is not planin gon getting pregnant right now but, pt want to know her options. She does not want a full work up today   [place] 1 po q day  F/u in 3 months  Refilled Gabapentin x 3 months  Total face-to-face time with patient was 76min.  Greater than 50% was spent in counseling and coordination of care with the patient.   Jaxsun Ciampi L. Harraway-Smith, M.D., Cherlynn June

## 2018-02-19 ENCOUNTER — Encounter: Payer: Self-pay | Admitting: Obstetrics and Gynecology

## 2018-02-19 ENCOUNTER — Other Ambulatory Visit (HOSPITAL_COMMUNITY)
Admission: RE | Admit: 2018-02-19 | Discharge: 2018-02-19 | Disposition: A | Discharge status: Home / Self Care | Payer: Medicaid Other | Source: Ambulatory Visit | Attending: Obstetrics and Gynecology | Admitting: Obstetrics and Gynecology

## 2018-02-19 ENCOUNTER — Ambulatory Visit (INDEPENDENT_AMBULATORY_CARE_PROVIDER_SITE_OTHER): Payer: Medicaid Other | Admitting: Obstetrics and Gynecology

## 2018-02-19 VITALS — BP 108/80 | HR 74 | Wt 146.6 lb

## 2018-02-19 DIAGNOSIS — R102 Pelvic and perineal pain: Secondary | ICD-10-CM

## 2018-02-19 DIAGNOSIS — Z124 Encounter for screening for malignant neoplasm of cervix: Secondary | ICD-10-CM | POA: Insufficient documentation

## 2018-02-19 DIAGNOSIS — R9389 Abnormal findings on diagnostic imaging of other specified body structures: Secondary | ICD-10-CM | POA: Insufficient documentation

## 2018-02-19 DIAGNOSIS — N912 Amenorrhea, unspecified: Secondary | ICD-10-CM | POA: Insufficient documentation

## 2018-02-19 DIAGNOSIS — Z113 Encounter for screening for infections with a predominantly sexual mode of transmission: Secondary | ICD-10-CM | POA: Insufficient documentation

## 2018-02-19 DIAGNOSIS — R3989 Other symptoms and signs involving the genitourinary system: Secondary | ICD-10-CM

## 2018-02-19 DIAGNOSIS — Z01419 Encounter for gynecological examination (general) (routine) without abnormal findings: Secondary | ICD-10-CM

## 2018-02-19 LAB — POCT URINALYSIS DIP (DEVICE)
Bilirubin Urine: NEGATIVE
Glucose, UA: NEGATIVE mg/dL
Ketones, ur: NEGATIVE mg/dL
Nitrite: NEGATIVE
Protein, ur: 100 mg/dL — AB
Specific Gravity, Urine: 1.025 (ref 1.005–1.030)
Urobilinogen, UA: 0.2 mg/dL (ref 0.0–1.0)
pH: 6 (ref 5.0–8.0)

## 2018-02-19 MED ORDER — NORETHIN ACE-ETH ESTRAD-FE 1-20 MG-MCG(24) PO TABS: 1.0000 | ORAL_TABLET | Freq: Every day | ORAL | 11 refills | Status: DC

## 2018-02-19 NOTE — Progress Notes (Signed)
Had SAB followed by [facility] about 2 yrs ago. Since then has had intermittent pelvic pain and pressure. These symptoms becoming more frequent. No vag d/c and irreg spotting. After D&C took Depo one time and has had total 2 periods since then. LMP was 02/14/18

## 2018-02-19 NOTE — Progress Notes (Signed)
GYNECOLOGY OFFICE VISIT NOTE  History:  32 y.o. G4P3 here today for pelvic pressure. Reports pressure started with pregnancy in 2017. Doesn't think she has UTI. Hurts/feels pressure after she urinates. Has occasional blood after urination when she pushes, believes it is coming from vagina but she is not sure. Has pain and pressure in vagina, states it hurts worse with movement. No pain with intercourse. Reports her pain/pressure has not gotten worse or any better. S/p one depo shot after D&C for MAB in 2017, since then she has only had 2 periods. Was started on sprintec by Dr. Ihor Dow for 2nd amenorrhea but patient has not started sprintec.   Past Medical History:  Diagnosis Date  . Bigeminal pulse   . Complication of anesthesia   . Migraine    Past Surgical History:  Procedure Laterality Date  . CHOLECYSTECTOMY    . DILATION AND EVACUATION N/A 03/09/2016   Procedure: DILATATION AND EVACUATION;  Surgeon: Lavonia Drafts, MD;  Location: [facility];  Service: Gynecology;  Laterality: N/A;  . FOOT SURGERY    . infection in foot removed     . TONSILLECTOMY      Current Outpatient Medications:  .  Aspirin-Salicylamide-Caffeine (BC HEADACHE POWDER PO), Take 1 each by mouth every 6 (six) hours as needed (migraine). , Disp: , Rfl:  .  gabapentin (NEURONTIN) 300 MG capsule, Take 1 capsule (300 mg total) by mouth 2 (two) times daily., Disp: 60 capsule, Rfl: 3 .  ibuprofen (ADVIL,MOTRIN) 200 MG tablet, Take 600 mg by mouth every 6 (six) hours as needed for headache, mild pain or moderate pain., Disp: , Rfl:  .  Norethindrone Acetate-Ethinyl Estrad-FE (LOESTRIN 24 FE) 1-20 MG-MCG(24) tablet, Take 1 tablet by mouth daily., Disp: 1 Package, Rfl: 11 .  norgestimate-ethinyl estradiol (ORTHO-CYCLEN,SPRINTEC,PREVIFEM) 0.25-35 MG-MCG tablet, Take 1 tablet by mouth daily. (Patient not taking: Reported on 02/19/2018), Disp: 1 Package, Rfl: 11  The following portions of the patient's history were  reviewed and updated as appropriate: allergies, current medications, past family history, past medical history, past social history, past surgical history and problem list.   Review of Systems:  Pertinent items noted in HPI and remainder of comprehensive ROS otherwise negative.   Objective:  Physical Exam BP 108/80   Pulse 74   Wt 146 lb 9.6 oz (66.5 kg)   LMP 02/14/2018   BMI 25.97 kg/m  CONSTITUTIONAL: Well-developed, well-nourished female in no acute distress.  NEUROLOGIC: Alert and oriented to person, place, and time. Normal reflexes, muscle tone coordination. No cranial nerve deficit noted. PSYCHIATRIC: Normal mood and affect. Normal behavior. Normal judgment and thought content. ABDOMEN: Soft, no distention noted.   PELVIC: Normal appearing external genitalia; normal appearing vaginal mucosa and cervix.  Dark red discharge noted.  Pap smear obtained.  Normal uterine size, significant tenderness with palpation of cervix, significant uterine tenderness, generalized adnexal tenderness, no masses palpable, generalized tenderness on anterior vaginal wall near bladder, no posterior or distal vaginal wall tenderness MUSCULOSKELETAL: Normal range of motion. No edema noted.  Labs and Imaging No results found.  Assessment & Plan:    2. Pap smear for cervical cancer screening - Cytology - PAP  3. Bladder pain - Urine Culture - Urine Culture  4. Amenorrhea Amenable to secondary amenorrhea workup - FSH - Prolactin - TSH - Estradiol  5. Pelvic pain Tenderness with CMT, concern for PID, will see what pelvic cultures show - US PELVIC COMPLETE WITH TRANSVAGINAL; Future - Urine Culture - reviewed possibility of  endometriosis, concern for pelvic tenderness and less likely endometriosis as it does not wax/wane with cycles but that it should improve on CHC if endometriosis in origin - she will start loestrin and see if that improves pain  6. Routine screening for STI (sexually  transmitted infection) - HIV antibody (with reflex) - RPR - Hepatitis C Antibody - Hepatitis B surface antigen -GC/CT done with pap  Routine preventative health maintenance measures emphasized. Please refer to After Visit Summary for other counseling recommendations.   Return if symptoms worsen or fail to improve.    Feliz Beam, M.D. [facility] [employer] [facility]

## 2018-02-20 ENCOUNTER — Encounter: Payer: Self-pay | Admitting: Obstetrics and Gynecology

## 2018-02-21 ENCOUNTER — Other Ambulatory Visit: Payer: Medicaid Other

## 2018-02-21 LAB — CYTOLOGY - PAP
Bacterial vaginitis: POSITIVE — AB
Chlamydia: NEGATIVE
Diagnosis: NEGATIVE
HPV: NOT DETECTED
Neisseria Gonorrhea: NEGATIVE

## 2018-02-21 LAB — URINE CULTURE: Organism ID, Bacteria: NO GROWTH

## 2018-02-21 MED ORDER — METRONIDAZOLE 500 MG PO TABS: 500.0000 mg | ORAL_TABLET | Freq: Two times a day (BID) | ORAL | 0 refills | Status: DC

## 2018-02-21 NOTE — Addendum Note (Signed)
Addended by: Vivien Rota on: 02/21/2018 02:16 PM   Modules accepted: Orders

## 2018-02-22 ENCOUNTER — Ambulatory Visit (HOSPITAL_COMMUNITY): Admission: RE | Admit: 2018-02-22 | Payer: Medicaid Other | Source: Ambulatory Visit

## 2018-02-24 LAB — PROLACTIN: Prolactin: 7 ng/mL (ref 4.8–23.3)

## 2018-02-24 LAB — HIV ANTIBODY (ROUTINE TESTING W REFLEX): HIV Screen 4th Generation wRfx: NONREACTIVE

## 2018-02-24 LAB — TSH: TSH: 0.627 u[IU]/mL (ref 0.450–4.500)

## 2018-02-24 LAB — RPR: RPR Ser Ql: NONREACTIVE

## 2018-02-24 LAB — FOLLICLE STIMULATING HORMONE: FSH: 6.3 m[IU]/mL

## 2018-02-24 LAB — ESTRADIOL: Estradiol: 46.3 pg/mL

## 2018-02-24 LAB — HEPATITIS B SURFACE ANTIGEN: Hepatitis B Surface Ag: NEGATIVE

## 2018-02-24 LAB — HEPATITIS C ANTIBODY: Hep C Virus Ab: <0.1 s/co ratio (ref 0.0–0.9)

## 2018-02-26 ENCOUNTER — Ambulatory Visit (HOSPITAL_COMMUNITY)
Admission: RE | Admit: 2018-02-26 | Discharge: 2018-02-26 | Disposition: A | Discharge status: Home / Self Care | Payer: Medicaid Other | Source: Ambulatory Visit | Attending: Obstetrics and Gynecology | Admitting: Obstetrics and Gynecology

## 2018-02-26 DIAGNOSIS — R102 Pelvic and perineal pain: Secondary | ICD-10-CM | POA: Diagnosis not present

## 2018-03-07 ENCOUNTER — Telehealth: Payer: Self-pay | Admitting: Obstetrics and Gynecology

## 2018-03-07 NOTE — Telephone Encounter (Signed)
Patient is requesting a Referral to a urology Doctor

## 2018-03-07 NOTE — Telephone Encounter (Signed)
Called pt and informed family member to please tell the pt that we are returning her call to please have her give the office a call.

## 2018-03-08 ENCOUNTER — Emergency Department (HOSPITAL_COMMUNITY): Payer: Medicaid Other

## 2018-03-08 ENCOUNTER — Emergency Department (HOSPITAL_COMMUNITY)
Admission: EM | Admit: 2018-03-08 | Discharge: 2018-03-09 | Disposition: A | Discharge status: Home / Self Care | Payer: Medicaid Other | Attending: Emergency Medicine | Admitting: Emergency Medicine

## 2018-03-08 ENCOUNTER — Encounter (HOSPITAL_COMMUNITY): Payer: Self-pay

## 2018-03-08 ENCOUNTER — Other Ambulatory Visit: Payer: Self-pay

## 2018-03-08 DIAGNOSIS — R300 Dysuria: Secondary | ICD-10-CM | POA: Insufficient documentation

## 2018-03-08 DIAGNOSIS — Z79899 Other long term (current) drug therapy: Secondary | ICD-10-CM | POA: Diagnosis not present

## 2018-03-08 DIAGNOSIS — R102 Pelvic and perineal pain: Secondary | ICD-10-CM

## 2018-03-08 LAB — COMPREHENSIVE METABOLIC PANEL
ALT: 25 U/L (ref 0–44)
AST: 25 U/L (ref 15–41)
Albumin: 4.3 g/dL (ref 3.5–5.0)
Alkaline Phosphatase: 79 U/L (ref 38–126)
Anion gap: 12 (ref 5–15)
BUN: 14 mg/dL (ref 6–20)
CO2: 27 mmol/L (ref 22–32)
Calcium: 9.7 mg/dL (ref 8.9–10.3)
Chloride: 102 mmol/L (ref 98–111)
Creatinine, Ser: 0.84 mg/dL (ref 0.44–1.00)
GFR calc Af Amer: >60 mL/min (ref >60)
GFR calc non Af Amer: >60 mL/min (ref >60)
Glucose, Bld: 137 mg/dL — ABNORMAL HIGH (ref 70–99)
Potassium: 4 mmol/L (ref 3.5–5.1)
Sodium: 141 mmol/L (ref 135–145)
Total Bilirubin: 0.6 mg/dL (ref 0.3–1.2)
Total Protein: 7.3 g/dL (ref 6.5–8.1)

## 2018-03-08 LAB — CBC
HCT: 41.1 % (ref 36.0–46.0)
Hemoglobin: 13.6 g/dL (ref 12.0–15.0)
MCH: 27 pg (ref 26.0–34.0)
MCHC: 33.1 g/dL (ref 30.0–36.0)
MCV: 81.7 fL (ref 78.0–100.0)
Platelets: 183 10*3/uL (ref 150–400)
RBC: 5.03 MIL/uL (ref 3.87–5.11)
RDW: 13.9 % (ref 11.5–15.5)
WBC: 10.9 10*3/uL — ABNORMAL HIGH (ref 4.0–10.5)

## 2018-03-08 LAB — WET PREP, GENITAL
Clue Cells Wet Prep HPF POC: NONE SEEN
Sperm: NONE SEEN
Trich, Wet Prep: NONE SEEN
WBC, Wet Prep HPF POC: NONE SEEN
Yeast Wet Prep HPF POC: NONE SEEN

## 2018-03-08 LAB — URINALYSIS, ROUTINE W REFLEX MICROSCOPIC
Bacteria, UA: NONE SEEN
Bilirubin Urine: NEGATIVE
Glucose, UA: NEGATIVE mg/dL
Ketones, ur: NEGATIVE mg/dL
Leukocytes, UA: NEGATIVE
Nitrite: NEGATIVE
Protein, ur: 100 mg/dL — AB
Specific Gravity, Urine: 1.025 (ref 1.005–1.030)
pH: 5 (ref 5.0–8.0)

## 2018-03-08 LAB — LIPASE, BLOOD: Lipase: 29 U/L (ref 11–51)

## 2018-03-08 LAB — I-STAT BETA HCG BLOOD, ED (MC, WL, AP ONLY): I-stat hCG, quantitative: <5 m[IU]/mL (ref <5)

## 2018-03-08 MED ORDER — IOPAMIDOL (ISOVUE-300) INJECTION 61%
100.0000 mL | Freq: Once | INTRAVENOUS | Status: AC | PRN
  Administered 2018-03-08: 100 mL via INTRAVENOUS

## 2018-03-08 MED ORDER — IOPAMIDOL (ISOVUE-300) INJECTION 61%
INTRAVENOUS | Status: AC
  Filled 2018-03-08: qty 100

## 2018-03-08 MED ORDER — KETOROLAC TROMETHAMINE 30 MG/ML IJ SOLN
30.0000 mg | Freq: Once | INTRAMUSCULAR | Status: AC
  Administered 2018-03-08: 30 mg via INTRAVENOUS
  Filled 2018-03-08: qty 1

## 2018-03-08 NOTE — ED Notes (Signed)
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 This RN unable to place PIV however charge RN asked to assess for IV placement. PA aware of delay.

## 2018-03-08 NOTE — ED Notes (Signed)
Pt states that she does not want anything for pain unless it is motrin or tylenol.

## 2018-03-08 NOTE — ED Triage Notes (Signed)
Pt reports severe pain in lower abdomen. Pt reports that ever since she had a miscarriage 2 years ago that she has pain when she pees. Pt reports pain has increased over the last couple hours. Pt reports light spotting after urination today. Pt reports vomiting. Denies diarrhea.

## 2018-03-08 NOTE — ED Provider Notes (Signed)
[facility] DEPT Provider Note   CSN: [id number] Arrival date & time: 03/08/18  1920     History   Chief Complaint Chief Complaint  Patient presents with  . Abdominal Pain    HPI Kristy Hunt is a 32 y.o. female.  HPI Kristy Hunt is a 32 y.o. female resents to emergency department complaining of pelvic pain.  Patient states she has had on and off pain for about 2 years after she had D&C after miscarriage.  Patient states that in the last 2 weeks the pain has become more severe.  She has seen her OB/GYN and had an exam as well as pelvic ultrasound done which was unremarkable.  She states that she is having some dysuria, states specifically pain after she finishes urinating.  She states pain is mainly pelvic and on the inside of her vagina.  Pain is improved with intercourse.  Denies any vaginal discharge.  Denies any hematuria.  No back pain.  No fever or chills.  She has not taken anything for pain.  Patient states "I just want to make sure I do not have bladder cancer.  Past Medical History:  Diagnosis Date  . Bigeminal pulse   . Complication of anesthesia   . Migraine     Patient Active Problem List   Diagnosis Date Noted  . Pelvic pain in female 05/16/2016  . Missed abortion     Past Surgical History:  Procedure Laterality Date  . CHOLECYSTECTOMY    . DILATION AND EVACUATION N/A 03/09/2016   Procedure: DILATATION AND EVACUATION;  Surgeon: Lavonia Drafts, MD;  Location: [facility];  Service: Gynecology;  Laterality: N/A;  . FOOT SURGERY    . infection in foot removed     . TONSILLECTOMY       OB History    Gravida  4   Para  3   Term      Preterm      AB      Living  3     SAB      TAB      Ectopic      Multiple      Live Births               Home Medications    Prior to Admission medications   Medication Sig Start Date End Date Taking? Authorizing Provider  buprenorphine (SUBUTEX) 8 MG SUBL  SL tablet Place 8 mg under the tongue 2 (two) times daily. 03/01/18  Yes [provider]  cloNIDine (CATAPRES) 0.1 MG tablet Take 0.1 mg by mouth 3 (three) times daily. 02/22/18  Yes [provider]  gabapentin (NEURONTIN) 300 MG capsule Take 1 capsule (300 mg total) by mouth 2 (two) times daily. 07/27/17  Yes Lavonia Drafts, MD  metroNIDAZOLE (FLAGYL) 500 MG tablet Take 1 tablet (500 mg total) by mouth 2 (two) times daily. Patient not taking: Reported on 03/08/2018 02/21/18   Sloan Leiter, MD  Norethindrone Acetate-Ethinyl Estrad-FE (LOESTRIN 24 FE) 1-20 MG-MCG(24) tablet Take 1 tablet by mouth daily. Patient not taking: Reported on 03/08/2018 02/19/18   Sloan Leiter, MD  norgestimate-ethinyl estradiol (ORTHO-CYCLEN,SPRINTEC,PREVIFEM) 0.25-35 MG-MCG tablet Take 1 tablet by mouth daily. Patient not taking: Reported on 02/19/2018 07/27/17   Lavonia Drafts, MD    Family History Family History  Problem Relation Age of Onset  . Endometriosis Mother   . Diabetes Maternal Grandfather   . Diabetes Paternal Grandmother     Social  History Social History   Tobacco Use  . Smoking status: Never Smoker  . Smokeless tobacco: Never Used  Substance Use Topics  . Alcohol use: No  . Drug use: No     Allergies   Dilaudid [hydromorphone hcl]; Hydromorphone hcl; Morphine and related; and Other   Review of Systems Review of Systems  Constitutional: Negative for chills and fever.  Respiratory: Negative for cough, chest tightness and shortness of breath.   Cardiovascular: Negative for chest pain, palpitations and leg swelling.  Gastrointestinal: Positive for abdominal pain. Negative for diarrhea, nausea and vomiting.  Genitourinary: Positive for pelvic pain and vaginal pain. Negative for dysuria, flank pain, vaginal bleeding and vaginal discharge.  Musculoskeletal: Negative for arthralgias, myalgias, neck pain and neck stiffness.  Skin: Negative for rash.  Neurological:  Negative for dizziness, weakness and headaches.  All other systems reviewed and are negative.    Physical Exam Updated Vital Signs BP 115/75 (BP Location: Right Arm)   Pulse 80   Temp (!) 97.3 F (36.3 C) (Oral)   Resp 18   Ht 5\' 3"  (1.6 m)   Wt 65.8 kg   LMP 02/14/2018   SpO2 99%   BMI 25.69 kg/m   Physical Exam  Constitutional: She is oriented to person, place, and time. She appears well-developed and well-nourished. No distress.  HENT:  Head: Normocephalic.  Eyes: Conjunctivae are normal.  Neck: Neck supple.  Cardiovascular: Normal rate, regular rhythm and normal heart sounds.  Pulmonary/Chest: Effort normal and breath sounds normal. No respiratory distress. She has no wheezes. She has no rales.  Abdominal: Soft. Bowel sounds are normal. She exhibits no distension. There is tenderness. There is no rebound.  Suprapubic tenderness  Genitourinary:  Genitourinary Comments: Normal external genitalia.  White thin vaginal discharge.  No cervical motion tenderness, uterine tenderness present, no adnexal tenderness  Musculoskeletal: She exhibits no edema.  Neurological: She is alert and oriented to person, place, and time.  Skin: Skin is warm and dry.  Psychiatric: She has a normal mood and affect. Her behavior is normal.  Nursing note and vitals reviewed.    ED Treatments / Results  Labs (all labs ordered are listed, but only abnormal results are displayed) Labs Reviewed  WET PREP, GENITAL  LIPASE, BLOOD  COMPREHENSIVE METABOLIC PANEL  CBC  URINALYSIS, ROUTINE W REFLEX MICROSCOPIC  I-STAT BETA HCG BLOOD, ED ([facility], [facility], [facility] ONLY)  GC/CHLAMYDIA PROBE AMP ([place]) NOT AT [facility]    EKG None  Radiology No results found.  Procedures Procedures (including critical care time)  Medications Ordered in ED Medications  ketorolac (TORADOL) 30 MG/ML injection 30 mg (has no administration in time range)     Initial Impression / Assessment and Plan / ED Course  I have  reviewed the triage vital signs and the nursing notes.  Pertinent labs & imaging results that were available during my care of the patient were reviewed by me and considered in my medical decision making (see chart for details).     Shunt with persistent pelvic pain that is worse in the last week or so.  Negative pelvic ultrasound by GYN, negative exam.  Patient is currently crying in the room.  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  She appears to be in a lot of pain.  Will give Toradol and get labs and CT abdomen pelvis.  Her pelvic exam is unremarkable  Signed out to PA sanders pending labs and CT abd/pelv. If  negative, outpatient follow up  Vitals:   03/08/18 1957 03/08/18 2000 03/08/18 2231  BP:  114/72 115/75  Pulse:  73 80  Resp:  16 18  Temp:  (!) 97.3 F (36.3 C)   TempSrc:  Oral   SpO2:  97% 99%  Weight: 65.8 kg    Height: 5\' 3"  (1.6 m)       Final Clinical Impressions(s) / ED Diagnoses   Final diagnoses:  None    ED Discharge Orders    None       Jeannett Senior, PA-C 03/08/18 2329    Milton Ferguson, MD 03/12/18 1230

## 2018-03-09 LAB — GC/CHLAMYDIA PROBE AMP (~~LOC~~) NOT AT ARMC
Chlamydia: NEGATIVE
Neisseria Gonorrhea: NEGATIVE

## 2018-03-09 NOTE — Discharge Instructions (Signed)
You can follow-back up with your OB-GYN. If you want to see urologist-- you can have your primary care doctor refer you or you can try to call and make an appt. Return to the ED for new or worsening symptoms.

## 2018-03-09 NOTE — ED Provider Notes (Signed)
Assumed care from [place] at shift change.  See prior notes for full H&P.  Briefly 32 year old female presenting to the ED with pelvic pain.  Has had pain on and off for 2 years since she had D&C after miscarriage.  Has already followed up with her OB/GYN and had a pelvic exam as well as pelvic ultrasound that was normal.  Came in today because she was concerned for "bladder cancer".  Repeat pelvic done here today without any acute findings.  Plan: Labs pending.  Plan for CT scan for further evaluation.  If negative to discharge home.  Results for orders placed or performed during the hospital encounter of 03/08/18  Wet prep, genital  Result Value Ref Range   Yeast Wet Prep HPF POC NONE SEEN NONE SEEN   Trich, Wet Prep NONE SEEN NONE SEEN   Clue Cells Wet Prep HPF POC NONE SEEN NONE SEEN   WBC, Wet Prep HPF POC NONE SEEN NONE SEEN   Sperm NONE SEEN   Lipase, blood  Result Value Ref Range   Lipase 29 11 - 51 U/L  Comprehensive metabolic panel  Result Value Ref Range   Sodium 141 135 - 145 mmol/L   Potassium 4.0 3.5 - 5.1 mmol/L   Chloride 102 98 - 111 mmol/L   CO2 27 22 - 32 mmol/L   Glucose, Bld 137 (H) 70 - 99 mg/dL   BUN 14 6 - 20 mg/dL   Creatinine, Ser 0.84 0.44 - 1.00 mg/dL   Calcium 9.7 8.9 - 10.3 mg/dL   Total Protein 7.3 6.5 - 8.1 g/dL   Albumin 4.3 3.5 - 5.0 g/dL   AST 25 15 - 41 U/L   ALT 25 0 - 44 U/L   Alkaline Phosphatase 79 38 - 126 U/L   Total Bilirubin 0.6 0.3 - 1.2 mg/dL   GFR calc non Af Amer >60 >60 mL/min   GFR calc Af Amer >60 >60 mL/min   Anion gap 12 5 - 15  CBC  Result Value Ref Range   WBC 10.9 (H) 4.0 - 10.5 K/uL   RBC 5.03 3.87 - 5.11 MIL/uL   Hemoglobin 13.6 12.0 - 15.0 g/dL   HCT 41.1 36.0 - 46.0 %   MCV 81.7 78.0 - 100.0 fL   MCH 27.0 26.0 - 34.0 pg   MCHC 33.1 30.0 - 36.0 g/dL   RDW 13.9 11.5 - 15.5 %   Platelets 183 150 - 400 K/uL  Urinalysis, Routine w reflex microscopic  Result Value Ref Range   Color, Urine YELLOW YELLOW    APPearance HAZY (A) CLEAR   Specific Gravity, Urine 1.025 1.005 - 1.030   pH 5.0 5.0 - 8.0   Glucose, UA NEGATIVE NEGATIVE mg/dL   Hgb urine dipstick MODERATE (A) NEGATIVE   Bilirubin Urine NEGATIVE NEGATIVE   Ketones, ur NEGATIVE NEGATIVE mg/dL   Protein, ur 100 (A) NEGATIVE mg/dL   Nitrite NEGATIVE NEGATIVE   Leukocytes, UA NEGATIVE NEGATIVE   RBC / HPF 21-50 0 - 5 RBC/hpf   Bacteria, UA NONE SEEN NONE SEEN   Squamous Epithelial / LPF 0-5 0 - 5   Mucus PRESENT    Hyaline Casts, UA PRESENT   I-Stat beta hCG blood, ED  Result Value Ref Range   I-stat hCG, quantitative <5.0 <5 mIU/mL   Comment 3           Ct Abdomen Pelvis W Contrast  Result Date: 03/09/2018 CLINICAL DATA:  Lower abdominal and pelvic pain  EXAM: CT ABDOMEN AND PELVIS WITH CONTRAST TECHNIQUE: Multidetector CT imaging of the abdomen and pelvis was performed using the standard protocol following bolus administration of intravenous contrast. CONTRAST:  114mL ISOVUE-300 IOPAMIDOL (ISOVUE-300) INJECTION 61% COMPARISON:  None. FINDINGS: Lower chest: Lung bases demonstrate no acute consolidation or effusion. The heart size is normal. Hepatobiliary: No focal liver abnormality is seen. Status post cholecystectomy. No biliary dilatation. Pancreas: Unremarkable. No pancreatic ductal dilatation or surrounding inflammatory changes. Spleen: Borderline enlarged. Adrenals/Urinary Tract: Adrenal glands are normal. Subcentimeter hypodensity mid left kidney too small to further characterize. No hydronephrosis. Bladder normal. Stomach/Bowel: Stomach is within normal limits. Appendix appears normal. No evidence of bowel wall thickening, distention, or inflammatory changes. Vascular/Lymphatic: No significant vascular findings are present. No enlarged abdominal or pelvic lymph nodes. Reproductive: Uterus and bilateral adnexa are unremarkable. Rim enhancing involuting cyst right ovary. Other: Negative for free air or free fluid. Musculoskeletal: No acute  or significant osseous findings. IMPRESSION: No CT evidence for acute intra-abdominal or pelvic abnormality. Electronically Signed   By: Donavan Foil M.D.   On: 03/09/2018 00:12   US Pelvic Complete With Transvaginal  Result Date: 02/26/2018 CLINICAL DATA:  Pelvic pain. EXAM: TRANSABDOMINAL AND TRANSVAGINAL ULTRASOUND OF PELVIS TECHNIQUE: Both transabdominal and transvaginal ultrasound examinations of the pelvis were performed. Transabdominal technique was performed for global imaging of the pelvis including uterus, ovaries, adnexal regions, and pelvic cul-de-sac. It was necessary to proceed with endovaginal exam following the transabdominal exam to visualize the endometrium and ovaries. COMPARISON:  None FINDINGS: Uterus Measurements: 8.2 x 4.4 x 5.9 cm. No fibroids or other mass visualized. Endometrium Thickness: 4 mm.  No focal abnormality visualized. Right ovary Measurements: 4.5 x 2.8 x 2.7 cm. Normal appearance/no adnexal mass. Left ovary Measurements: 4.2 x 2.6 x 2.8 cm. Normal appearance/no adnexal mass. Other findings No abnormal free fluid. IMPRESSION: Negative. No pelvic mass or other significant abnormality identified. Electronically Signed   By: Earle Gell M.D.   On: 02/26/2018 16:28   CT scan without acute findings.  Patient is somewhat agitated and that we have not been able to give her explanation for her pain here.  I discussed with her that she has been having ongoing pain for 2 years without diagnosis, we are unlikely to find cause of her pain today.  Patient will be referred back to OB-GYN for follow-up.  Patient is inquiring about referral to urologist as she feels she needs additional testing, she was given information for local clinic and advised to call for appt.  Return precautions given for new/acute changes.   Larene Pickett, PA-C 03/09/18 0054    Molpus, Jenny Reichmann, MD 03/09/18 [phone]

## 2018-03-13 ENCOUNTER — Telehealth: Payer: Self-pay | Admitting: General Practice

## 2018-03-13 DIAGNOSIS — R300 Dysuria: Secondary | ICD-10-CM

## 2018-03-13 DIAGNOSIS — R102 Pelvic and perineal pain: Secondary | ICD-10-CM

## 2018-03-13 NOTE — Telephone Encounter (Addendum)
Patient called back into office requesting information about referral. [facility] and they state patient will need referral from [facility] (PCP), we are unable to place referral. Informed patient. Patient verbalized understanding. Discussed Dr Rosana Hoes will reach out to her via mychart with medication information & instructions. Patient verbalized understanding to all & had no questions.

## 2018-03-13 NOTE — Telephone Encounter (Signed)
Called patient, no answer- unable to leave message. Will send mychart message

## 2018-03-13 NOTE — Telephone Encounter (Addendum)
-----   Message from Sloan Leiter, MD sent at 03/12/2018  2:36 PM EDT ----- I called her today and couldn't get through, sent a message saying I wanted to talk to her about next steps. She had normal lab work, I think a urology appointment is reasonable, she also needs a provera challenge for secondary amenorrhea if you happen to speak with her.    ----- Message ----- From: Shelly Coss, RN Sent: 03/09/2018   2:58 PM EDT To: Sloan Leiter, MD  Hello! You saw this patient a couple weeks for pelvic/bladder pain. She went to the ER last night because the pain has become severe. She says it feels like a spasm/deep cramp comparable with labor every time she urinates/immediately before urination. She is requesting a referral to Urology (specifically [facility] in [facility]) as she thinks it is Interstitial Cystitis causing the pain. Patient requests a mychart message to be sent with your thoughts.   Thanks [employer]

## 2018-03-14 ENCOUNTER — Other Ambulatory Visit: Payer: Self-pay | Admitting: Obstetrics and Gynecology

## 2018-03-14 MED ORDER — MEDROXYPROGESTERONE ACETATE 10 MG PO TABS: 10.0000 mg | ORAL_TABLET | Freq: Every day | ORAL | 0 refills | Status: DC

## 2018-03-14 NOTE — Progress Notes (Signed)
Provera sent to pharmacy for progestin challenge.

## 2019-01-01 IMAGING — US US PELVIS COMPLETE TRANSABD/TRANSVAG
1 series · 15 of 25 positions shown · non-contrast
Comparison: None

CLINICAL DATA: Pelvic pain.

EXAM:
TRANSABDOMINAL AND TRANSVAGINAL ULTRASOUND OF PELVIS
TECHNIQUE: Both transabdominal and transvaginal ultrasound examinations of the
pelvis were performed. Transabdominal technique was performed for
global imaging of the pelvis including uterus, ovaries, adnexal
regions, and pelvic cul-de-sac. It was necessary to proceed with
endovaginal exam following the transabdominal exam to visualize the
endometrium and ovaries.

[Series 1: us pelvis complete transabd/transvag · 31 acquisitions, 15 frames shown]
[im 1/31]
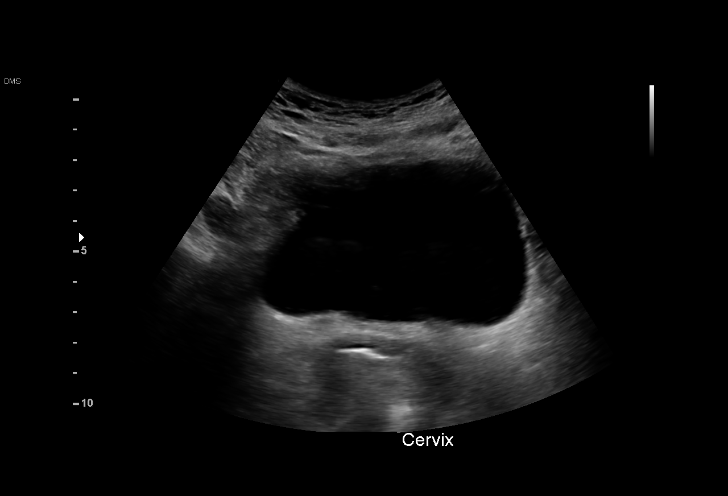
[im 3/31]
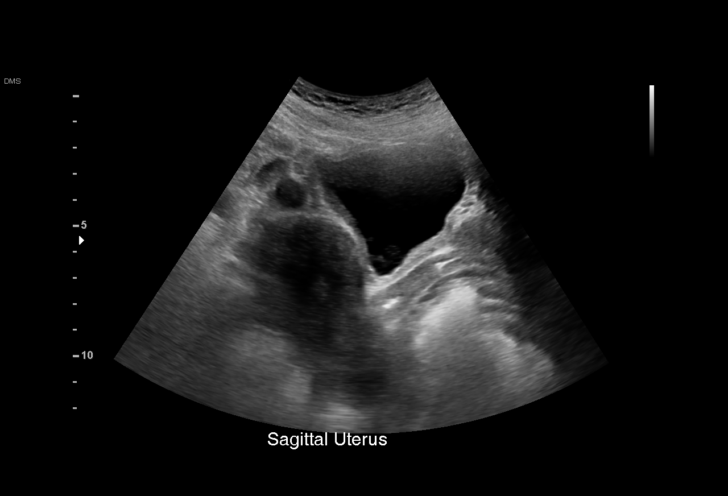
[im 6/31]
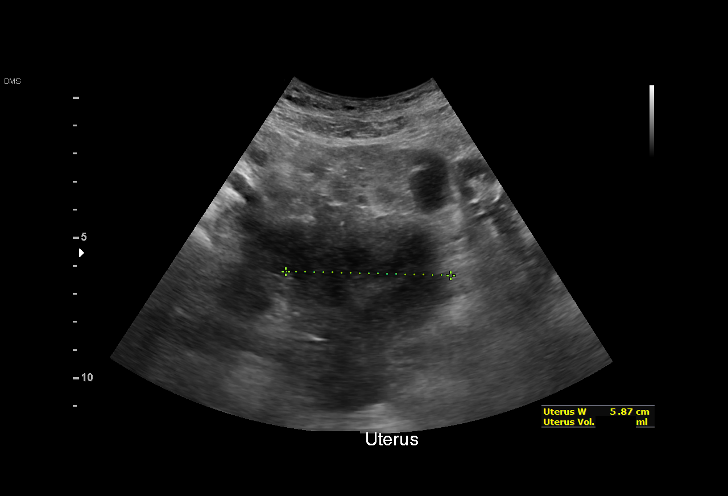
[im 7/31]
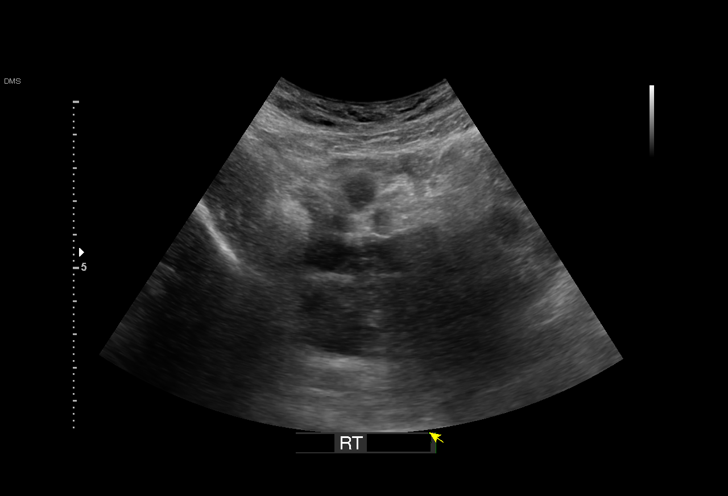
[im 9/31]
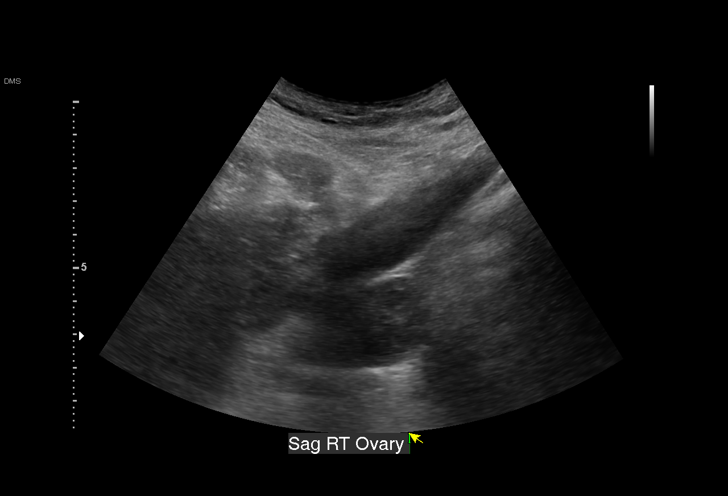
[im 12/31]
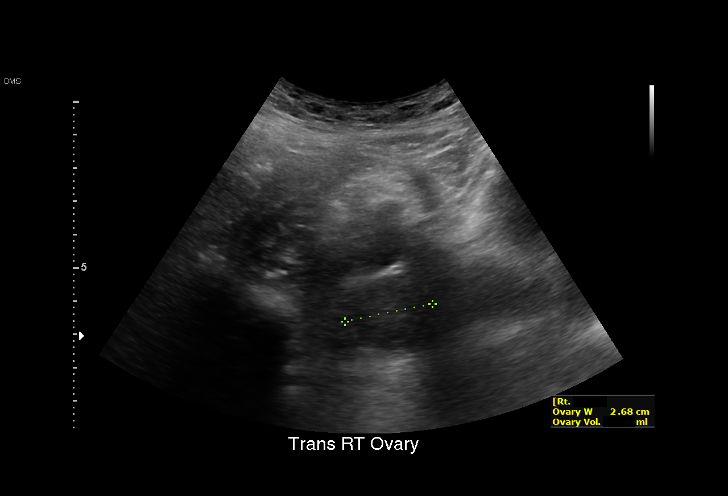
[im 13/31]
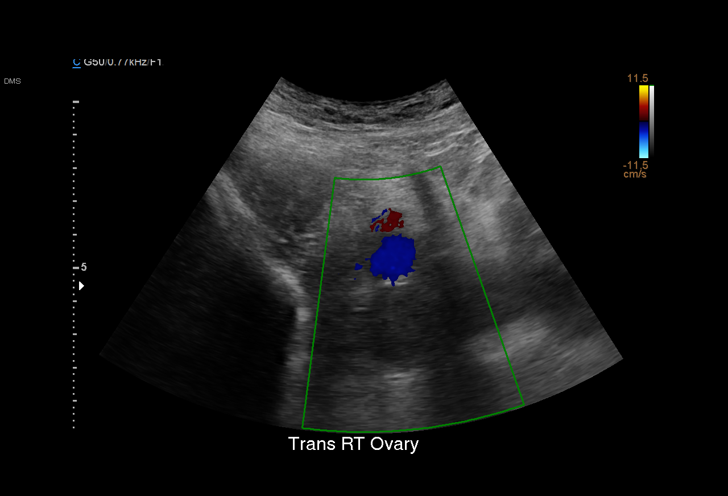
[im 16/31]
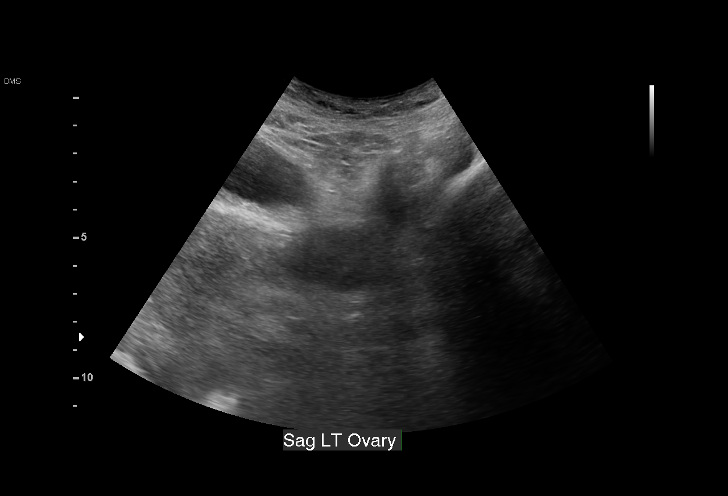
[im 18/31]
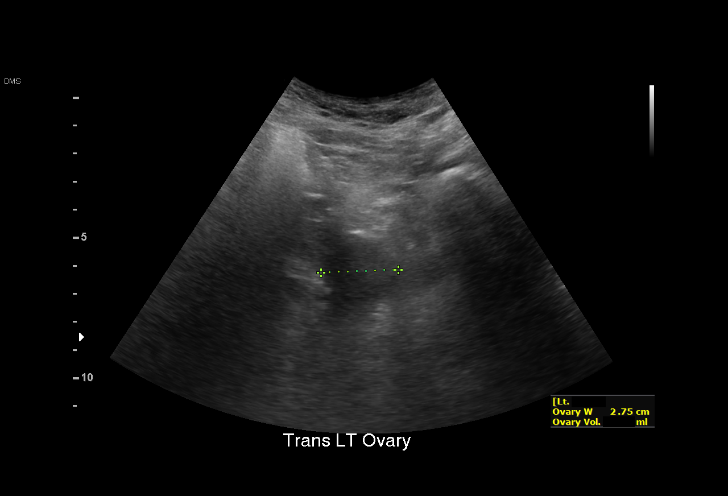
[im 19/31]
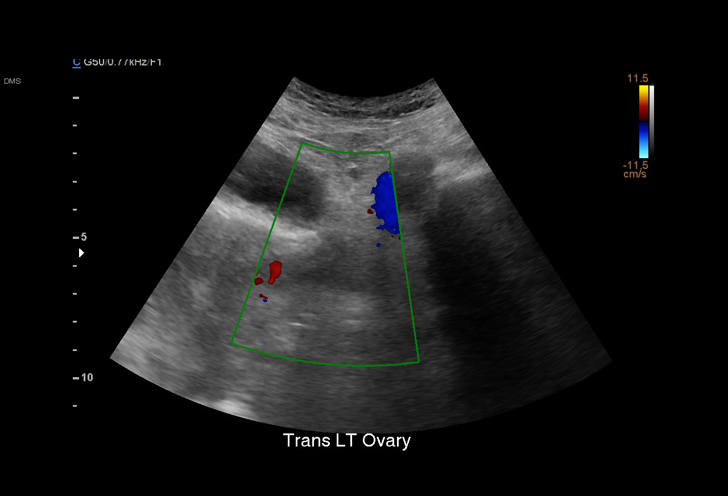
[im 22/31]
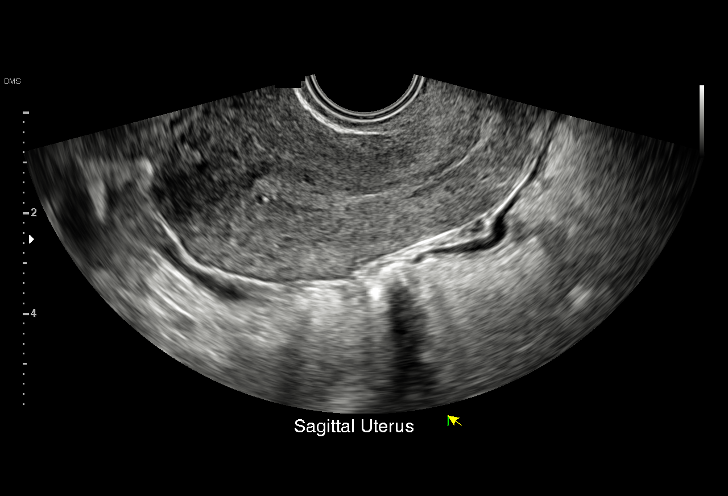
[im 24/31]
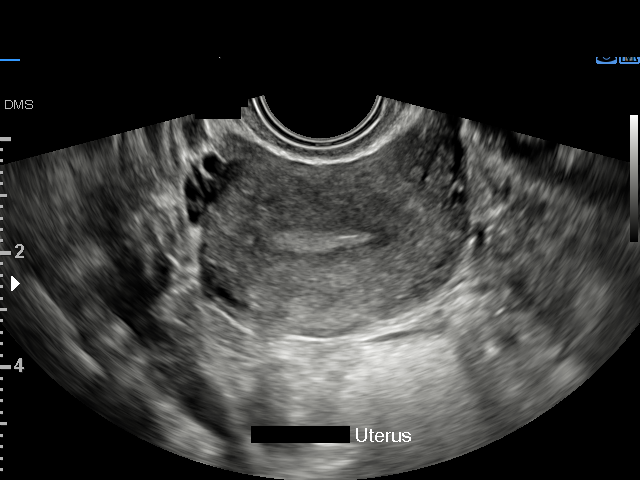
[im 26/31]
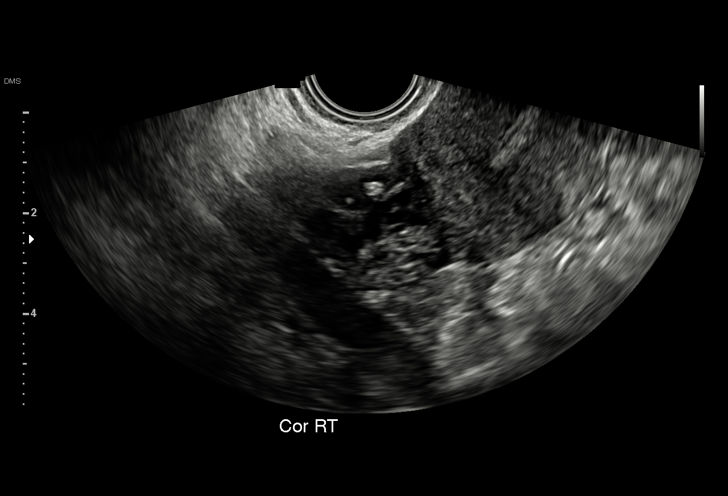
[im 28/31]
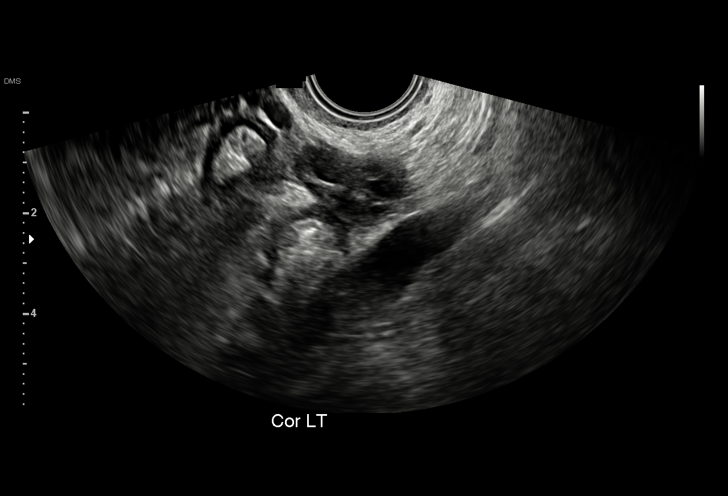
[im 31/31]
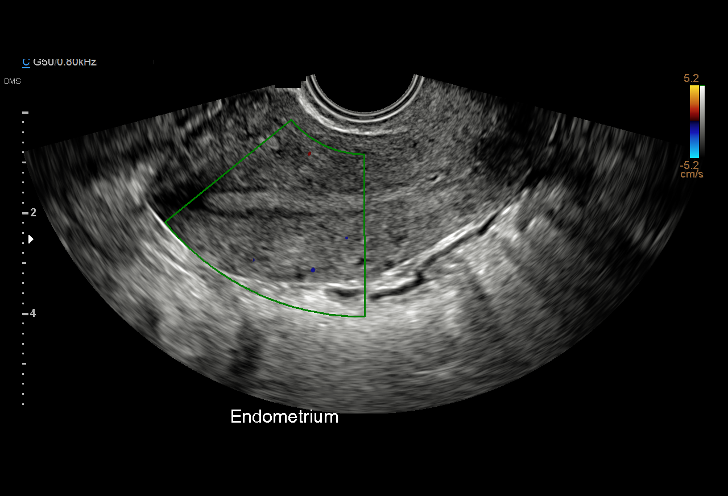

[15 of 25 positions shown; findings below may reference images not displayed]

FINDINGS: Uterus

Measurements: 8.2 x 4.4 x 5.9 cm. No fibroids or other mass
visualized.

Endometrium

Thickness: 4 mm.  No focal abnormality visualized.

Right ovary

Measurements: 4.5 x 2.8 x 2.7 cm. Normal appearance/no adnexal mass.

Left ovary

Measurements: 4.2 x 2.6 x 2.8 cm. Normal appearance/no adnexal mass.

Other findings

No abnormal free fluid.
IMPRESSION: Negative. No pelvic mass or other significant abnormality
identified.

## 2019-05-21 ENCOUNTER — Other Ambulatory Visit: Payer: Self-pay

## 2019-05-21 DIAGNOSIS — Z20822 Contact with and (suspected) exposure to covid-19: Secondary | ICD-10-CM

## 2019-05-22 LAB — NOVEL CORONAVIRUS, NAA: SARS-CoV-2, NAA: DETECTED — AB

## 2020-09-02 ENCOUNTER — Telehealth: Payer: Self-pay | Admitting: Family Medicine

## 2020-09-02 NOTE — Telephone Encounter (Signed)
Pt states she has 'a bump or knot near her panty line' that is increasing in size and pain, states her PCP said that was OBGYN issue, pt needs someone to call her about this.

## 2020-09-04 ENCOUNTER — Telehealth: Payer: Self-pay | Admitting: Family Medicine

## 2020-09-04 NOTE — Telephone Encounter (Signed)
Called and left message that I am returning her call if she continues to have questions or concerns to please give the office a call.   Frances Nickels  09/04/20

## 2020-09-04 NOTE — Telephone Encounter (Signed)
Left message for pt that I am returning her call if she continue to have questions give the office a call or send a MyChart.  However we will not respond until Monday due to our office currently closed.

## 2020-09-04 NOTE — Telephone Encounter (Signed)
Pt states she missed a call from the nurse and states that he ringer was off and now on and if yo could call her back

## 2020-09-24 ENCOUNTER — Other Ambulatory Visit: Payer: Self-pay

## 2020-09-24 ENCOUNTER — Encounter: Payer: Self-pay | Admitting: Family Medicine

## 2020-09-24 ENCOUNTER — Other Ambulatory Visit (HOSPITAL_COMMUNITY)
Admission: RE | Admit: 2020-09-24 | Discharge: 2020-09-24 | Disposition: A | Discharge status: Home / Self Care | Payer: Medicaid Other | Source: Ambulatory Visit | Attending: Family Medicine | Admitting: Family Medicine

## 2020-09-24 ENCOUNTER — Ambulatory Visit (INDEPENDENT_AMBULATORY_CARE_PROVIDER_SITE_OTHER): Payer: Medicaid Other | Admitting: Family Medicine

## 2020-09-24 VITALS — BP 129/82 | HR 65 | Ht 63.0 in | Wt 151.0 lb

## 2020-09-24 DIAGNOSIS — Z01419 Encounter for gynecological examination (general) (routine) without abnormal findings: Secondary | ICD-10-CM | POA: Diagnosis not present

## 2020-09-24 DIAGNOSIS — L989 Disorder of the skin and subcutaneous tissue, unspecified: Secondary | ICD-10-CM

## 2020-09-24 NOTE — Progress Notes (Signed)
GYNECOLOGY ANNUAL PREVENTATIVE CARE ENCOUNTER NOTE  Subjective:   Kristy Hunt is a 35 y.o. G4P3 female here for a routine annual gynecologic exam.  Current complaints: 1cm bump on right inner thigh, which started a little over a year ago. Rubs on clothes and tender. Slightly raised and increased in size.   Denies abnormal vaginal bleeding, discharge, pelvic pain, problems with intercourse or other gynecologic concerns.    Gynecologic History Patient's last menstrual period was 08/27/2020 (exact date). Patient is not currently sexually active  Contraception: none Last Pap: about 3 years ago. Results were: normal Last mammogram: n/a  Obstetric History OB History  Gravida Para Term Preterm AB Living  4 3       3   SAB IAB Ectopic Multiple Live Births               # Outcome Date GA Lbr Len/2nd Weight Sex Delivery Anes PTL Lv  4 Gravida           3 Para           2 Para           1 Para             Past Medical History:  Diagnosis Date  . Bigeminal pulse   . Complication of anesthesia   . Migraine   . Vaginal Pap smear, abnormal     Past Surgical History:  Procedure Laterality Date  . CHOLECYSTECTOMY    . DILATION AND EVACUATION N/A 03/09/2016   Procedure: DILATATION AND EVACUATION;  Surgeon: Lavonia Drafts, MD;  Location: [facility];  Service: Gynecology;  Laterality: N/A;  . FOOT SURGERY    . infection in foot removed     . TONSILLECTOMY      Current Outpatient Medications on File Prior to Visit  Medication Sig Dispense Refill  . buprenorphine (SUBUTEX) 8 MG SUBL SL tablet Place 8 mg under the tongue 2 (two) times daily.  0  . cloNIDine (CATAPRES) 0.1 MG tablet Take 0.1 mg by mouth 3 (three) times daily. (Patient not taking: Reported on 09/24/2020)  5  . gabapentin (NEURONTIN) 300 MG capsule Take 1 capsule (300 mg total) by mouth 2 (two) times daily. (Patient not taking: Reported on 09/24/2020) 60 capsule 3  . medroxyPROGESTERone (PROVERA) 10 MG tablet Take  1 tablet (10 mg total) by mouth daily. Use for ten days 10 tablet 0  . Norethindrone Acetate-Ethinyl Estrad-FE (LOESTRIN 24 FE) 1-20 MG-MCG(24) tablet Take 1 tablet by mouth daily. (Patient not taking: No sig reported) 1 Package 11  . norgestimate-ethinyl estradiol (ORTHO-CYCLEN,SPRINTEC,PREVIFEM) 0.25-35 MG-MCG tablet Take 1 tablet by mouth daily. (Patient not taking: No sig reported) 1 Package 11   No current facility-administered medications on file prior to visit.    Allergies  Allergen Reactions  . Dilaudid [Hydromorphone Hcl] Hives and Shortness Of Breath  . Hydromorphone Hcl Shortness Of Breath and Hives  . Morphine And Related Hives and Shortness Of Breath    Pt states she can tolerate Percocet  . Other Shortness Of Breath    Other reaction(s): Hives Pt states she can tolerate Percocet    Social History   Socioeconomic History  . Marital status: Legally Separated    Spouse name: Not on file  . Number of children: Not on file  . Years of education: Not on file  . Highest education level: Not on file  Occupational History  . Not on file  Tobacco Use  . Smoking  status: Never Smoker  . Smokeless tobacco: Never Used  Vaping Use  . Vaping Use: Never used  Substance and Sexual Activity  . Alcohol use: No  . Drug use: No  . Sexual activity: Yes    Birth control/protection: None  Other Topics Concern  . Not on file  Social History Narrative  . Not on file   Social Determinants of Health   Financial Resource Strain: Not on file  Food Insecurity: Not on file  Transportation Needs: Not on file  Physical Activity: Not on file  Stress: Not on file  Social Connections: Not on file  Intimate Partner Violence: Not on file    Family History  Problem Relation Age of Onset  . Endometriosis Mother   . Diabetes Maternal Grandfather   . Diabetes Paternal Grandmother     The following portions of the patient's history were reviewed and updated as appropriate: allergies,  current medications, past family history, past medical history, past social history, past surgical history and problem list.  Review of Systems Pertinent items are noted in HPI.   Objective:  BP 129/82   Pulse 65   Ht 5\' 3"  (1.6 m)   Wt 151 lb (68.5 kg)   LMP 08/27/2020 (Exact Date)   BMI 26.75 kg/m  Wt Readings from Last 3 Encounters:  09/24/20 151 lb (68.5 kg)  03/08/18 145 lb (65.8 kg)  02/19/18 146 lb 9.6 oz (66.5 kg)     Chaperone present during exam  CONSTITUTIONAL: Well-developed, well-nourished female in no acute distress.  HENT:  Normocephalic, atraumatic, External right and left ear normal. Oropharynx is clear and moist EYES: Conjunctivae and EOM are normal. Pupils are equal, round, and reactive to light. No scleral icterus.  NECK: Normal range of motion, supple, no masses.  Normal thyroid.   CARDIOVASCULAR: Normal heart rate noted, regular rhythm RESPIRATORY: Clear to auscultation bilaterally. Effort and breath sounds normal, no problems with respiration noted. BREASTS: Symmetric in size. No masses, skin changes, nipple drainage, or lymphadenopathy. ABDOMEN: Soft, normal bowel sounds, no distention noted.  No tenderness, rebound or guarding.  PELVIC: Normal appearing external genitalia; normal appearing vaginal mucosa and cervix.  No abnormal discharge noted.  Normal uterine size, no other palpable masses, no uterine or adnexal tenderness. MUSCULOSKELETAL: Normal range of motion. No tenderness.  No cyanosis, clubbing, or edema.  2+ distal pulses. SKIN: 1cm raised soft area on right inner thigh. No current erythema. Skin is warm and dry. No rash noted. Not diaphoretic. No erythema. No pallor. NEUROLOGIC: Alert and oriented to person, place, and time. Normal reflexes, muscle tone coordination. No cranial nerve deficit noted. PSYCHIATRIC: Normal mood and affect. Normal behavior. Normal judgment and thought content.  Assessment:  Annual gynecologic examination with pap  smear   Plan:  1. Well Woman Exam Will follow up results of pap smear and manage accordingly. STD testing discussed. Patient requested vaginal testing - Cytology - PAP( [place])  2. Skin lesion of right leg Will plan for excision.    Routine preventative health maintenance measures emphasized. Please refer to After Visit Summary for other counseling recommendations.    Loma Boston, [place] for [employer]

## 2020-09-24 NOTE — Progress Notes (Signed)
Patient states she may have skin tag on upper leg. Kathrene Alu RN

## 2020-09-30 LAB — CYTOLOGY - PAP
Chlamydia: NEGATIVE
Comment: NEGATIVE
Comment: NEGATIVE
Comment: NORMAL
Diagnosis: NEGATIVE
Diagnosis: REACTIVE
High risk HPV: NEGATIVE
Neisseria Gonorrhea: NEGATIVE

## 2020-10-08 ENCOUNTER — Other Ambulatory Visit: Payer: Self-pay

## 2020-10-08 ENCOUNTER — Other Ambulatory Visit (HOSPITAL_COMMUNITY)
Admission: RE | Admit: 2020-10-08 | Discharge: 2020-10-08 | Disposition: A | Discharge status: Home / Self Care | Payer: BC Managed Care – PPO | Source: Ambulatory Visit | Attending: Family Medicine | Admitting: Family Medicine

## 2020-10-08 ENCOUNTER — Encounter: Payer: Self-pay | Admitting: Family Medicine

## 2020-10-08 ENCOUNTER — Ambulatory Visit (INDEPENDENT_AMBULATORY_CARE_PROVIDER_SITE_OTHER): Payer: Medicaid Other | Admitting: Family Medicine

## 2020-10-08 VITALS — BP 117/58 | HR 67 | Wt 150.0 lb

## 2020-10-08 DIAGNOSIS — L989 Disorder of the skin and subcutaneous tissue, unspecified: Secondary | ICD-10-CM | POA: Diagnosis not present

## 2020-10-08 NOTE — Progress Notes (Signed)
Skin lesion removal.  Skin cleaned with betadine and field block placed with 49mL of Lidocaine. Eliptical incision made around the area about 2x3cm. Area revealed what appears to be a small 1cm lipoma, which was removed. 3-0 Vicryl used for interrupted sutures and dressing applied on top. Instructions for care given. Return in 1 week.  Truett Mainland, DO

## 2020-10-13 LAB — SURGICAL PATHOLOGY

## 2020-10-15 ENCOUNTER — Encounter: Payer: Self-pay | Admitting: Family Medicine

## 2020-10-15 ENCOUNTER — Other Ambulatory Visit: Payer: Self-pay

## 2020-10-15 ENCOUNTER — Ambulatory Visit (INDEPENDENT_AMBULATORY_CARE_PROVIDER_SITE_OTHER): Payer: Medicaid Other | Admitting: Family Medicine

## 2020-10-15 VITALS — BP 120/68 | HR 68 | Wt 150.0 lb

## 2020-10-15 DIAGNOSIS — D1723 Benign lipomatous neoplasm of skin and subcutaneous tissue of right leg: Secondary | ICD-10-CM

## 2020-10-15 NOTE — Progress Notes (Signed)
Patient presents for one week follow up. Kathrene Alu RN

## 2020-10-15 NOTE — Progress Notes (Signed)
Patient Name: Kristy Hunt, female   DOB: [birth year], 35 y.o.  MRN: [id number]  Sutures removed. Mild reddness to area - patient had been wearing jeans earlier today that was rubbing on the area. No warmth to the area. Incision clean, dry, intact. If redness, pain increases or starts to have purulent drainage, patient to call.   Start using Vitamin E oil to help with scarring.  Path results reviewed. Follow up as needed.

## 2021-06-13 DIAGNOSIS — S82001B Unspecified fracture of right patella, initial encounter for open fracture type I or II: Secondary | ICD-10-CM | POA: Insufficient documentation

## 2021-06-13 DIAGNOSIS — S3210XA Unspecified fracture of sacrum, initial encounter for closed fracture: Secondary | ICD-10-CM | POA: Insufficient documentation

## 2022-01-11 ENCOUNTER — Encounter: Payer: Self-pay | Admitting: General Practice

## 2022-02-26 ENCOUNTER — Encounter (HOSPITAL_BASED_OUTPATIENT_CLINIC_OR_DEPARTMENT_OTHER): Payer: Self-pay | Admitting: Emergency Medicine

## 2022-02-26 DIAGNOSIS — R200 Anesthesia of skin: Secondary | ICD-10-CM | POA: Insufficient documentation

## 2022-02-26 DIAGNOSIS — R109 Unspecified abdominal pain: Secondary | ICD-10-CM | POA: Insufficient documentation

## 2022-02-26 DIAGNOSIS — N9489 Other specified conditions associated with female genital organs and menstrual cycle: Secondary | ICD-10-CM | POA: Diagnosis not present

## 2022-02-26 DIAGNOSIS — R079 Chest pain, unspecified: Secondary | ICD-10-CM | POA: Diagnosis not present

## 2022-02-26 DIAGNOSIS — M25522 Pain in left elbow: Secondary | ICD-10-CM | POA: Insufficient documentation

## 2022-02-26 DIAGNOSIS — O26899 Other specified pregnancy related conditions, unspecified trimester: Secondary | ICD-10-CM | POA: Insufficient documentation

## 2022-02-26 DIAGNOSIS — O2341 Unspecified infection of urinary tract in pregnancy, first trimester: Secondary | ICD-10-CM | POA: Diagnosis not present

## 2022-02-26 DIAGNOSIS — Z5321 Procedure and treatment not carried out due to patient leaving prior to being seen by health care provider: Secondary | ICD-10-CM | POA: Insufficient documentation

## 2022-02-26 NOTE — ED Triage Notes (Signed)
Pt reports intermittent LUE numbness x 1 wk; positive preg test today; feeling " flutters" in chest as well as cramping in chest and abd

## 2022-02-27 ENCOUNTER — Emergency Department (HOSPITAL_BASED_OUTPATIENT_CLINIC_OR_DEPARTMENT_OTHER)
Admission: EM | Admit: 2022-02-27 | Discharge: 2022-02-27 | Disposition: A | Discharge status: Home / Self Care | Payer: BC Managed Care – PPO | Source: Home / Self Care | Attending: Emergency Medicine | Admitting: Emergency Medicine

## 2022-02-27 ENCOUNTER — Emergency Department (HOSPITAL_BASED_OUTPATIENT_CLINIC_OR_DEPARTMENT_OTHER)
Admission: EM | Admit: 2022-02-27 | Discharge: 2022-02-27 | Disposition: A | Discharge status: Home / Self Care | Payer: BC Managed Care – PPO | Attending: Emergency Medicine | Admitting: Emergency Medicine

## 2022-02-27 ENCOUNTER — Emergency Department (HOSPITAL_BASED_OUTPATIENT_CLINIC_OR_DEPARTMENT_OTHER): Payer: BC Managed Care – PPO

## 2022-02-27 ENCOUNTER — Encounter (HOSPITAL_BASED_OUTPATIENT_CLINIC_OR_DEPARTMENT_OTHER): Payer: Self-pay | Admitting: Emergency Medicine

## 2022-02-27 ENCOUNTER — Other Ambulatory Visit: Payer: Self-pay

## 2022-02-27 DIAGNOSIS — M25522 Pain in left elbow: Secondary | ICD-10-CM | POA: Insufficient documentation

## 2022-02-27 DIAGNOSIS — N9489 Other specified conditions associated with female genital organs and menstrual cycle: Secondary | ICD-10-CM | POA: Insufficient documentation

## 2022-02-27 DIAGNOSIS — Z3201 Encounter for pregnancy test, result positive: Secondary | ICD-10-CM

## 2022-02-27 DIAGNOSIS — R109 Unspecified abdominal pain: Secondary | ICD-10-CM

## 2022-02-27 DIAGNOSIS — R079 Chest pain, unspecified: Secondary | ICD-10-CM | POA: Insufficient documentation

## 2022-02-27 DIAGNOSIS — O26899 Other specified pregnancy related conditions, unspecified trimester: Secondary | ICD-10-CM | POA: Diagnosis not present

## 2022-02-27 DIAGNOSIS — O2341 Unspecified infection of urinary tract in pregnancy, first trimester: Secondary | ICD-10-CM | POA: Insufficient documentation

## 2022-02-27 LAB — TROPONIN I (HIGH SENSITIVITY): Troponin I (High Sensitivity): 2 ng/L (ref <18)

## 2022-02-27 LAB — CBC WITH DIFFERENTIAL/PLATELET
Abs Immature Granulocytes: 0.02 10*3/uL (ref 0.00–0.07)
Basophils Absolute: 0 10*3/uL (ref 0.0–0.1)
Basophils Relative: 1 %
Eosinophils Absolute: 0 10*3/uL (ref 0.0–0.5)
Eosinophils Relative: 1 %
HCT: 40.2 % (ref 36.0–46.0)
Hemoglobin: 13.9 g/dL (ref 12.0–15.0)
Immature Granulocytes: 0 %
Lymphocytes Relative: 17 %
Lymphs Abs: 1.1 10*3/uL (ref 0.7–4.0)
MCH: 28.6 pg (ref 26.0–34.0)
MCHC: 34.6 g/dL (ref 30.0–36.0)
MCV: 82.7 fL (ref 80.0–100.0)
Monocytes Absolute: 0.4 10*3/uL (ref 0.1–1.0)
Monocytes Relative: 5 %
Neutro Abs: 5 10*3/uL (ref 1.7–7.7)
Neutrophils Relative %: 76 %
Platelets: 205 10*3/uL (ref 150–400)
RBC: 4.86 MIL/uL (ref 3.87–5.11)
RDW: 12.5 % (ref 11.5–15.5)
WBC: 6.5 10*3/uL (ref 4.0–10.5)
nRBC: 0 % (ref 0.0–0.2)

## 2022-02-27 LAB — URINALYSIS, ROUTINE W REFLEX MICROSCOPIC
Bilirubin Urine: NEGATIVE
Glucose, UA: NEGATIVE mg/dL
Hgb urine dipstick: NEGATIVE
Ketones, ur: NEGATIVE mg/dL
Nitrite: NEGATIVE
Protein, ur: NEGATIVE mg/dL
Specific Gravity, Urine: 1.01 (ref 1.005–1.030)
pH: 5.5 (ref 5.0–8.0)

## 2022-02-27 LAB — HCG, QUANTITATIVE, PREGNANCY: hCG, Beta Chain, Quant, S: 96 m[IU]/mL — ABNORMAL HIGH (ref <5)

## 2022-02-27 LAB — URINALYSIS, MICROSCOPIC (REFLEX)

## 2022-02-27 MED ORDER — CEFADROXIL 500 MG PO CAPS: 500.0000 mg | ORAL_CAPSULE | Freq: Two times a day (BID) | ORAL | 0 refills | Status: DC

## 2022-02-27 NOTE — ED Notes (Signed)
Labs obtained via [place] by Stormy Card RN

## 2022-02-27 NOTE — ED Notes (Signed)
Pt requesting snack/drink; graham crackers, ginger ale provided

## 2022-02-27 NOTE — ED Notes (Signed)
EDP aware of need for lab redraw; EDP attending to try next

## 2022-02-27 NOTE — ED Triage Notes (Signed)
Pt arrives pov, steady gait, c/o CP and "left arm feels funny" starting yesterday. Also c/o left side arm and hand numbness x 1 week. States LWBS yesterday d/t wait time. Endorse positive preg test

## 2022-02-27 NOTE — ED Notes (Signed)
Pt said she needed to leave to pick up daughter from work, EDP and RN aware.

## 2022-02-27 NOTE — Discharge Instructions (Addendum)
You were seen in the emergency department for evaluation of your chest pain, shortness of breath, lower abdominal cramping, and your left elbow pain.  Your left elbow pain sounds like medial epicondylitis.  Given your positive pregnancy test, you should only take Tylenol for this.  You can pick up some over-the-counter topical lidocaine to apply to the area as well.  Your chest pain work-up was unremarkable.  Again if you continue to have chest pain or shortness of breath or any leg swelling you need to return the emergency department immediately.  Your ultrasound did not show any acute changes or emergent problem, however you are very early in your pregnancy.  Please call your OB/GYN tomorrow to schedule an appointment for repeat beta-hCG and ultrasound.  Again, your urine was questionable for a urinary tract infection, although given that you are in your first trimester pregnancy, we will treat this.  When give you some Duricef which she should take twice daily for the next 7 days.  Make sure to drink plenty of water and stay well-hydrated.  If you have any concerns, new or worsening symptoms, please return to the ER to emergency department for evaluation.  Contact a doctor if: Your chest pain does not go away. You feel depressed. You have a fever. Get help right away if: Your chest pain is worse. You have a cough that gets worse, or you cough up blood. You have very bad (severe) pain in your belly (abdomen). You pass out (faint). You have either of these for no clear reason: Sudden chest discomfort. Sudden discomfort in your arms, back, neck, or jaw. You have shortness of breath at any time. You suddenly start to sweat, or your skin gets clammy. You feel sick to your stomach (nauseous). You throw up (vomit). You suddenly feel lightheaded or dizzy. You feel very weak or tired. Your heart starts to beat fast, or it feels like it is skipping beats. Have nausea and vomiting. Have back or side  pain. Have lower belly pain, tightness, or feel contractions in your uterus. Have a gush of fluid from your vagina. Have blood in your urine. These symptoms may be an emergency. Do not wait to see if the symptoms will go away. Get medical help right away. Call your local emergency services (911 in the [place] Do not drive yourself to the hospital.

## 2022-02-27 NOTE — ED Notes (Signed)
2nd attempt for labs via [place] by Dr. Maryan Rued

## 2022-02-27 NOTE — ED Notes (Signed)
Patient transported to Ultrasound 
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
## 2022-02-28 LAB — URINE CULTURE: Culture: 40000 — AB

## 2022-03-01 ENCOUNTER — Telehealth: Payer: Self-pay

## 2022-03-01 ENCOUNTER — Other Ambulatory Visit (INDEPENDENT_AMBULATORY_CARE_PROVIDER_SITE_OTHER): Payer: BC Managed Care – PPO

## 2022-03-01 VITALS — BP 114/52 | HR 78 | Wt 158.0 lb

## 2022-03-01 DIAGNOSIS — O3680X0 Pregnancy with inconclusive fetal viability, not applicable or unspecified: Secondary | ICD-10-CM

## 2022-03-01 DIAGNOSIS — R109 Unspecified abdominal pain: Secondary | ICD-10-CM

## 2022-03-01 LAB — BETA HCG QUANT (REF LAB): hCG Quant: 154 m[IU]/mL

## 2022-03-01 NOTE — Telephone Encounter (Signed)
Post ED Visit - Positive Culture Follow-up  Culture report reviewed by antimicrobial stewardship pharmacist: [facility] '[x]'$  Esmeralda Arthur, Pharm.D. '[]'$  Heide Guile, Pharm.D., BCPS AQ-ID '[]'$  Parks Neptune, Pharm.D., BCPS '[]'$  Alycia Rossetti, Pharm.D., BCPS '[]'$  [place], [place].D., BCPS, AAHIVP '[]'$  Legrand Como, Pharm.D., BCPS, AAHIVP '[]'$  Salome Arnt, PharmD, BCPS '[]'$  Johnnette Gourd, PharmD, BCPS '[]'$  Hughes Better, PharmD, BCPS '[]'$  Leeroy Cha, PharmD '[]'$  Laqueta Linden, PharmD, BCPS '[]'$  Albertina Parr, PharmD  [facility] Team '[]'$  Leodis Sias, PharmD '[]'$  Lindell Spar, PharmD '[]'$  Royetta Asal, PharmD '[]'$  Graylin Shiver, Rph '[]'$  Rema Fendt) Glennon Mac, PharmD '[]'$  Arlyn Dunning, PharmD '[]'$  Netta Cedars, PharmD '[]'$  Dia Sitter, PharmD '[]'$  Leone Haven, PharmD '[]'$  Gretta Arab, PharmD '[]'$  Theodis Shove, PharmD '[]'$  Peggyann Juba, PharmD '[]'$  Reuel Boom, PharmD   Positive urine culture Treated with Cefadroxil, organism sensitive to the same and no further patient follow-up is required at this time.  Glennon Hamilton 03/01/2022, 11:29 AM

## 2022-03-01 NOTE — Progress Notes (Signed)
Patient presents for repeat HCG. Pt denies bleeding but states she is having lower right and left abdominal pain. Advised pt to go to [facility] if she starts bleeding heavy like a period. STAT HCG was ordered. Tavonte Seybold l Monnie Gudgel, CMA

## 2022-03-02 ENCOUNTER — Other Ambulatory Visit: Payer: Self-pay

## 2022-03-02 ENCOUNTER — Telehealth: Payer: Self-pay

## 2022-03-02 DIAGNOSIS — O3680X0 Pregnancy with inconclusive fetal viability, not applicable or unspecified: Secondary | ICD-10-CM

## 2022-03-02 DIAGNOSIS — O26899 Other specified pregnancy related conditions, unspecified trimester: Secondary | ICD-10-CM

## 2022-03-02 NOTE — Telephone Encounter (Signed)
Patient called after HCG drawn yesterday.  Spoke with Dr Nehemiah Settle and she should return in 48 hours to have repeat HCG done. Kathrene Alu  RN

## 2022-03-03 ENCOUNTER — Ambulatory Visit: Payer: BC Managed Care – PPO

## 2022-03-03 DIAGNOSIS — O3680X0 Pregnancy with inconclusive fetal viability, not applicable or unspecified: Secondary | ICD-10-CM

## 2022-03-03 NOTE — Progress Notes (Signed)
Patient sent to lab. [facility]

## 2022-03-04 ENCOUNTER — Telehealth: Payer: Self-pay

## 2022-03-04 LAB — BETA HCG QUANT (REF LAB): hCG Quant: 348 m[IU]/mL

## 2022-03-04 NOTE — Progress Notes (Signed)
Error

## 2022-03-04 NOTE — Telephone Encounter (Signed)
Called patient to inform her that her Beta HCG increased from 154 to 348. Pt made aware that her [place] will be done at her 04/01/22 appointment. Understanding was voiced. Lanee Chain l Maebell Lyvers, CMA

## 2022-03-15 ENCOUNTER — Inpatient Hospital Stay (HOSPITAL_COMMUNITY)
Admission: AD | Admit: 2022-03-15 | Discharge: 2022-03-15 | Disposition: A | Discharge status: Home / Self Care | Payer: BC Managed Care – PPO | Attending: Obstetrics & Gynecology | Admitting: Obstetrics & Gynecology

## 2022-03-15 ENCOUNTER — Other Ambulatory Visit: Payer: Self-pay

## 2022-03-15 ENCOUNTER — Inpatient Hospital Stay (HOSPITAL_COMMUNITY): Payer: BC Managed Care – PPO

## 2022-03-15 ENCOUNTER — Encounter (HOSPITAL_COMMUNITY): Payer: Self-pay | Admitting: Obstetrics & Gynecology

## 2022-03-15 ENCOUNTER — Telehealth: Payer: Self-pay | Admitting: General Practice

## 2022-03-15 DIAGNOSIS — O09521 Supervision of elderly multigravida, first trimester: Secondary | ICD-10-CM | POA: Insufficient documentation

## 2022-03-15 DIAGNOSIS — O26891 Other specified pregnancy related conditions, first trimester: Secondary | ICD-10-CM | POA: Insufficient documentation

## 2022-03-15 DIAGNOSIS — Z3A01 Less than 8 weeks gestation of pregnancy: Secondary | ICD-10-CM | POA: Insufficient documentation

## 2022-03-15 DIAGNOSIS — O209 Hemorrhage in early pregnancy, unspecified: Secondary | ICD-10-CM | POA: Diagnosis not present

## 2022-03-15 DIAGNOSIS — Z3490 Encounter for supervision of normal pregnancy, unspecified, unspecified trimester: Secondary | ICD-10-CM

## 2022-03-15 DIAGNOSIS — R102 Pelvic and perineal pain: Secondary | ICD-10-CM

## 2022-03-15 LAB — URINALYSIS, ROUTINE W REFLEX MICROSCOPIC
Bilirubin Urine: NEGATIVE
Glucose, UA: NEGATIVE mg/dL
Hgb urine dipstick: NEGATIVE
Ketones, ur: NEGATIVE mg/dL
Nitrite: NEGATIVE
Protein, ur: NEGATIVE mg/dL
Specific Gravity, Urine: 1.018 (ref 1.005–1.030)
pH: 6 (ref 5.0–8.0)

## 2022-03-15 LAB — CBC
HCT: 39.6 % (ref 36.0–46.0)
Hemoglobin: 13.7 g/dL (ref 12.0–15.0)
MCH: 29.1 pg (ref 26.0–34.0)
MCHC: 34.6 g/dL (ref 30.0–36.0)
MCV: 84.3 fL (ref 80.0–100.0)
Platelets: 174 10*3/uL (ref 150–400)
RBC: 4.7 MIL/uL (ref 3.87–5.11)
RDW: 12.8 % (ref 11.5–15.5)
WBC: 6.8 10*3/uL (ref 4.0–10.5)
nRBC: 0 % (ref 0.0–0.2)

## 2022-03-15 LAB — HCG, QUANTITATIVE, PREGNANCY: hCG, Beta Chain, Quant, S: 18352 m[IU]/mL — ABNORMAL HIGH (ref <5)

## 2022-03-15 MED ORDER — PROMETHAZINE HCL 25 MG PO TABS: 12.0000 mg | ORAL_TABLET | Freq: Four times a day (QID) | ORAL | 3 refills | Status: DC | PRN

## 2022-03-15 NOTE — MAU Provider Note (Signed)
History     CSN: [id number]  Arrival date and time: 03/15/22 1604   Event Date/Time   First Provider Initiated Contact with Patient 03/15/22 1709      Chief Complaint  Patient presents with   Abdominal Pain   Kristy Hunt is a 36 y.o. G6P0013 at 75w1dwho presents today with cramping and bleeding.   Pelvic Pain The patient's primary symptoms include pelvic pain and vaginal bleeding. This is a new problem. The current episode started yesterday. The problem occurs intermittently. The problem has been unchanged. The problem affects both sides. She is pregnant. The vaginal discharge was bloody. The vaginal bleeding is spotting. She has not been passing clots. She has not been passing tissue. Nothing aggravates the symptoms. She has tried acetaminophen for the symptoms. The treatment provided mild relief.    OB History     Gravida  6   Para  3   Term      Preterm      AB  1   Living  3      SAB  1   IAB      Ectopic      Multiple      Live Births              Past Medical History:  Diagnosis Date   Bigeminal pulse    Complication of anesthesia    Migraine    Vaginal Pap smear, abnormal     Past Surgical History:  Procedure Laterality Date   CHOLECYSTECTOMY     DILATION AND EVACUATION N/A 03/09/2016   Procedure: DILATATION AND EVACUATION;  Surgeon: CLavonia Drafts MD;  [facility];  Service: Gynecology;  Laterality: N/A;   FOOT SURGERY     infection in foot removed      PATELLA FRACTURE SURGERY     TONSILLECTOMY      Family History  Problem Relation Age of Onset   Endometriosis Mother    Diabetes Maternal Grandfather    Diabetes Paternal Grandmother     Social History   Tobacco Use   Smoking status: Never   Smokeless tobacco: Never  Vaping Use   Vaping Use: Never used  Substance Use Topics   Alcohol use: Not Currently   Drug use: Not Currently    Comment: in recovery for opiate abuse    Allergies:  Allergies   Allergen Reactions   Dilaudid [Hydromorphone Hcl] Hives and Shortness Of Breath   Hydromorphone Hcl Shortness Of Breath and Hives   Morphine And Related Hives and Shortness Of Breath    Pt states she can tolerate Percocet   Other Shortness Of Breath    Other reaction(s): Hives Pt states she can tolerate Percocet   Epinephrine     Heart flutters     Medications Prior to Admission  Medication Sig Dispense Refill Last Dose   buprenorphine (SUBUTEX) 8 MG SUBL SL tablet Place 2 mg under the tongue 2 (two) times daily.  0 03/15/2022   cefadroxil (DURICEF) 500 MG capsule Take 1 capsule (500 mg total) by mouth 2 (two) times daily. 14 capsule 0    cloNIDine (CATAPRES) 0.1 MG tablet Take 0.1 mg by mouth 3 (three) times daily. (Patient not taking: Reported on 09/24/2020)  5    gabapentin (NEURONTIN) 300 MG capsule Take 1 capsule (300 mg total) by mouth 2 (two) times daily. (Patient not taking: Reported on 09/24/2020) 60 capsule 3    medroxyPROGESTERone (PROVERA) 10 MG tablet Take 1  tablet (10 mg total) by mouth daily. Use for ten days 10 tablet 0    Norethindrone Acetate-Ethinyl Estrad-FE (LOESTRIN 24 FE) 1-20 MG-MCG(24) tablet Take 1 tablet by mouth daily. (Patient not taking: Reported on 03/08/2018) 1 Package 11    norgestimate-ethinyl estradiol (ORTHO-CYCLEN,SPRINTEC,PREVIFEM) 0.25-35 MG-MCG tablet Take 1 tablet by mouth daily. (Patient not taking: Reported on 02/19/2018) 1 Package 11    VRAYLAR capsule Take 1.5 mg by mouth daily.       Review of Systems  Genitourinary:  Positive for pelvic pain.  All other systems reviewed and are negative.  Physical Exam   Blood pressure 118/68, pulse 64, temperature 98.1 F (36.7 C), temperature source Oral, resp. rate 20, height '5\' 3"'$  (1.6 m), weight 71.9 kg, last menstrual period 01/17/2022, SpO2 99 %, unknown if currently breastfeeding.  Physical Exam Constitutional:      Appearance: She is well-developed.  HENT:     Head: Normocephalic.  Eyes:      Pupils: Pupils are equal, round, and reactive to light.  Cardiovascular:     Rate and Rhythm: Normal rate and regular rhythm.     Heart sounds: Normal heart sounds.  Pulmonary:     Effort: Pulmonary effort is normal. No respiratory distress.     Breath sounds: Normal breath sounds.  Abdominal:     Palpations: Abdomen is soft.     Tenderness: There is no abdominal tenderness.  Genitourinary:    Vagina: No bleeding. Vaginal discharge: mucusy.    Comments: External: no lesion Vagina: small amount of white discharge     Musculoskeletal:        General: Normal range of motion.     Cervical back: Normal range of motion and neck supple.  Skin:    General: Skin is warm and dry.  Neurological:     Mental Status: She is alert and oriented to person, place, and time.  Psychiatric:        Mood and Affect: Mood normal.        Behavior: Behavior normal.    Results for orders placed or performed during the hospital encounter of 03/15/22 (from the past 24 hour(s))  Urinalysis, Routine w reflex microscopic Urine, Clean Catch     Status: Abnormal   Collection Time: 03/15/22  4:35 PM  Result Value Ref Range   Color, Urine YELLOW YELLOW   APPearance HAZY (A) CLEAR   Specific Gravity, Urine 1.018 1.005 - 1.030   pH 6.0 5.0 - 8.0   Glucose, UA NEGATIVE NEGATIVE mg/dL   Hgb urine dipstick NEGATIVE NEGATIVE   Bilirubin Urine NEGATIVE NEGATIVE   Ketones, ur NEGATIVE NEGATIVE mg/dL   Protein, ur NEGATIVE NEGATIVE mg/dL   Nitrite NEGATIVE NEGATIVE   Leukocytes,Ua LARGE (A) NEGATIVE   RBC / HPF 0-5 0 - 5 RBC/hpf   WBC, UA 6-10 0 - 5 WBC/hpf   Bacteria, UA RARE (A) NONE SEEN   Squamous Epithelial / LPF 11-20 0 - 5   Mucus PRESENT   CBC     Status: None   Collection Time: 03/15/22  5:17 PM  Result Value Ref Range   WBC 6.8 4.0 - 10.5 K/uL   RBC 4.70 3.87 - 5.11 MIL/uL   Hemoglobin 13.7 12.0 - 15.0 g/dL   HCT 39.6 36.0 - 46.0 %   MCV 84.3 80.0 - 100.0 fL   MCH 29.1 26.0 - 34.0 pg   MCHC  34.6 30.0 - 36.0 g/dL   RDW 12.8 11.5 - 15.5 %  Platelets 174 150 - 400 K/uL   nRBC 0.0 0.0 - 0.2 %  hCG, quantitative, pregnancy     Status: Abnormal   Collection Time: 03/15/22  5:17 PM  Result Value Ref Range   hCG, Beta Chain, Quant, S 18,352 (H) <5 mIU/mL   US OB Transvaginal  Result Date: 03/15/2022 CLINICAL DATA:  Vaginal bleeding, spotting, cramping. EXAM: TRANSVAGINAL OB ULTRASOUND TECHNIQUE: Transvaginal ultrasound was performed for complete evaluation of the gestation as well as the maternal uterus, adnexal regions, and pelvic cul-de-sac. COMPARISON:  None Available. FINDINGS: Intrauterine gestational sac: Single Yolk sac:  Visualized. Embryo:  Visualized. Cardiac Activity: Visualized. Heart Rate: 107 bpm MSD:   mm    w     d CRL:   2.9 mm   5 w 6 d                  [place] EDC: 11/09/2022 Subchorionic hemorrhage:  None visualized. Maternal uterus/adnexae: Small expected corpus luteum within the LEFT ovary. No follow-up imaging is needed for this benign finding. Maternal ovaries appear otherwise normal and there is no mass or free fluid seen within either adnexal region. IMPRESSION: 1. Single live intrauterine pregnancy with estimated gestational age of [redacted] weeks and 6 days. 2. Maternal ovaries are unremarkable and there is no mass or free fluid identified in either adnexal region. Electronically Signed   By: Franki Cabot M.D.   On: 03/15/2022 17:47     MAU Course  Procedures  MDM   Assessment and Plan   1. Pelvic pain in pregnancy, antepartum, first trimester   2. Vaginal bleeding in pregnancy, first trimester   3. Intrauterine pregnancy   4. [redacted] weeks gestation of pregnancy    DC home in stable condition  Comfort measures reviewed  1st Trimester precautions  Bleeding precautions RX: none  Return to MAU as needed FU with OB as planned   [place] [facility] [place] Follow up.   Specialty: Obstetrics and Gynecology Contact  information: [address] [place] [id number] En[place] DNP, CNM  03/15/22  6:58 PM

## 2022-03-15 NOTE — MAU Note (Signed)
Kristy Hunt is a 36 y.o. at Unknown here in MAU reporting: lower cramping and spotting.  Reports abdominal cramping has has been occurring the last couple weeks but worsened today.  Reports spotting is with wiping. LMP: 01/17/2022 Onset of complaint: today Pain score: 6 Vitals:   03/15/22 1628  BP: 118/68  Pulse: 64  Resp: 20  Temp: 98.1 F (36.7 C)  SpO2: 99%     FHT:N/A Lab orders placed from triage:   UA

## 2022-03-15 NOTE — Telephone Encounter (Signed)
Pt called with c/o spotting and cramping.  Consulted with RN and pt was informed to go to MAU for further evaluation.  Pt verbalized understanding.

## 2022-03-23 ENCOUNTER — Ambulatory Visit: Payer: BC Managed Care – PPO

## 2022-04-01 ENCOUNTER — Ambulatory Visit: Payer: BC Managed Care – PPO

## 2022-04-05 ENCOUNTER — Encounter (HOSPITAL_COMMUNITY): Payer: Self-pay | Admitting: Obstetrics and Gynecology

## 2022-04-05 ENCOUNTER — Inpatient Hospital Stay (HOSPITAL_COMMUNITY)
Admission: AD | Admit: 2022-04-05 | Discharge: 2022-04-05 | Disposition: A | Discharge status: Home / Self Care | Payer: BC Managed Care – PPO | Attending: Obstetrics and Gynecology | Admitting: Obstetrics and Gynecology

## 2022-04-05 ENCOUNTER — Other Ambulatory Visit: Payer: Self-pay

## 2022-04-05 DIAGNOSIS — O23591 Infection of other part of genital tract in pregnancy, first trimester: Secondary | ICD-10-CM | POA: Insufficient documentation

## 2022-04-05 DIAGNOSIS — O26851 Spotting complicating pregnancy, first trimester: Secondary | ICD-10-CM | POA: Diagnosis present

## 2022-04-05 DIAGNOSIS — B379 Candidiasis, unspecified: Secondary | ICD-10-CM

## 2022-04-05 DIAGNOSIS — O98811 Other maternal infectious and parasitic diseases complicating pregnancy, first trimester: Secondary | ICD-10-CM | POA: Insufficient documentation

## 2022-04-05 DIAGNOSIS — Z3A08 8 weeks gestation of pregnancy: Secondary | ICD-10-CM | POA: Diagnosis not present

## 2022-04-05 DIAGNOSIS — O209 Hemorrhage in early pregnancy, unspecified: Secondary | ICD-10-CM

## 2022-04-05 DIAGNOSIS — B3731 Acute candidiasis of vulva and vagina: Secondary | ICD-10-CM | POA: Insufficient documentation

## 2022-04-05 LAB — URINALYSIS, ROUTINE W REFLEX MICROSCOPIC
Bilirubin Urine: NEGATIVE
Glucose, UA: NEGATIVE mg/dL
Ketones, ur: NEGATIVE mg/dL
Nitrite: NEGATIVE
Protein, ur: NEGATIVE mg/dL
Specific Gravity, Urine: 1.021 (ref 1.005–1.030)
pH: 6 (ref 5.0–8.0)

## 2022-04-05 LAB — CBC
HCT: 36.3 % (ref 36.0–46.0)
Hemoglobin: 12.5 g/dL (ref 12.0–15.0)
MCH: 29 pg (ref 26.0–34.0)
MCHC: 34.4 g/dL (ref 30.0–36.0)
MCV: 84.2 fL (ref 80.0–100.0)
Platelets: 187 10*3/uL (ref 150–400)
RBC: 4.31 MIL/uL (ref 3.87–5.11)
RDW: 12.6 % (ref 11.5–15.5)
WBC: 8.5 10*3/uL (ref 4.0–10.5)
nRBC: 0 % (ref 0.0–0.2)

## 2022-04-05 LAB — BASIC METABOLIC PANEL
Anion gap: 10 (ref 5–15)
BUN: 14 mg/dL (ref 6–20)
CO2: 23 mmol/L (ref 22–32)
Calcium: 9.3 mg/dL (ref 8.9–10.3)
Chloride: 103 mmol/L (ref 98–111)
Creatinine, Ser: 0.69 mg/dL (ref 0.44–1.00)
GFR, Estimated: >60 mL/min (ref >60)
Glucose, Bld: 96 mg/dL (ref 70–99)
Potassium: 4.3 mmol/L (ref 3.5–5.1)
Sodium: 136 mmol/L (ref 135–145)

## 2022-04-05 LAB — WET PREP, GENITAL
Clue Cells Wet Prep HPF POC: NONE SEEN
Sperm: NONE SEEN
Trich, Wet Prep: NONE SEEN
WBC, Wet Prep HPF POC: <10 (ref <10)

## 2022-04-05 MED ORDER — TERCONAZOLE 0.8 % VA CREA: 1.0000 | TOPICAL_CREAM | Freq: Every day | VAGINAL | 0 refills | Status: DC

## 2022-04-05 NOTE — Discharge Instructions (Signed)

## 2022-04-05 NOTE — MAU Provider Note (Signed)
History     CSN: [id number]  Arrival date and time: 04/05/22 1245   Event Date/Time   First Provider Initiated Contact with Patient 04/05/22 1432      Chief Complaint  Patient presents with   Back Pain   Spotting   HPI Kristy Hunt is a 36 y.o. [id number] at 26w6dwho presents to MAU with chief complaint of spotting and low back pain. These are recurrent problems for which patient was evaluated in MAU on 03/15/2022. Patient endorses seeing smears of blood when she wipes after voiding. She is not saturating pads. She is not experiencing abdominal pain, weakness or activity intolerance. Patient is concerned heavy lifting at work may have contributed to her spotting.  [facility]  OB History     Gravida  5   Para  3   Term  3   Preterm      AB  1   Living  3      SAB  1   IAB      Ectopic      Multiple      Live Births  3           Past Medical History:  Diagnosis Date   Bigeminal pulse    Complication of anesthesia    Migraine    Vaginal Pap smear, abnormal     Past Surgical History:  Procedure Laterality Date   CHOLECYSTECTOMY     DILATION AND EVACUATION N/A 03/09/2016   Procedure: DILATATION AND EVACUATION;  Surgeon: CLavonia Drafts MD;  [facility];  Service: Gynecology;  Laterality: N/A;   FOOT SURGERY     infection in foot removed      PATELLA FRACTURE SURGERY     TONSILLECTOMY      Family History  Problem Relation Age of Onset   Endometriosis Mother    Diabetes Maternal Grandfather    Diabetes Paternal Grandmother     Social History   Tobacco Use   Smoking status: Never   Smokeless tobacco: Never  Vaping Use   Vaping Use: Never used  Substance Use Topics   Alcohol use: Not Currently   Drug use: Not Currently    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  0 04/05/2022   cefadroxil (DURICEF) 500 MG capsule Take 1 capsule (500 mg total) by mouth 2 (two) times daily. 14 capsule 0    cloNIDine (CATAPRES) 0.1 MG tablet Take 0.1 mg by mouth 3 (three) times daily. (Patient not taking: Reported on 09/24/2020)  5    gabapentin (NEURONTIN) 300 MG capsule Take 1 capsule (300 mg total) by mouth 2 (two) times daily. (Patient not taking: Reported on 09/24/2020) 60 capsule 3    promethazine (PHENERGAN) 25 MG tablet Take 0.5-1 tablets (12.5-25 mg total) by mouth every 6 (six) hours as needed for  nausea or vomiting. 30 tablet 3    VRAYLAR capsule Take 1.5 mg by mouth daily.       Review of Systems  Genitourinary:  Positive for vaginal bleeding.  Musculoskeletal:  Positive for back pain.  All other systems reviewed and are negative.  Physical Exam   Blood pressure 116/64, pulse 70, resp. rate (!) 98, height '5\' 3"'$  (1.6 m), weight 72.3 kg, last menstrual period 01/17/2022, SpO2 99 %, unknown if currently breastfeeding.  Physical Exam Vitals and nursing note reviewed. Exam conducted with a chaperone present.  Constitutional:      Appearance: Normal appearance. She is not ill-appearing.  Cardiovascular:     Rate and Rhythm: Normal rate.     Pulses: Normal pulses.  Pulmonary:     Effort: Pulmonary effort is normal.  Abdominal:     General: Abdomen is flat.  Skin:    Capillary Refill: Capillary refill takes less than 2 seconds.  Neurological:     Mental Status: She is alert and oriented to person, place, and time.  Psychiatric:         Mood and Affect: Mood normal.        Behavior: Behavior normal.        Thought Content: Thought content normal.        Judgment: Judgment normal.     MAU Course  Procedures  MDM -Live IUP with cardiac flicker visualized on bedside ultrasound, audible FHT.   Patient Vitals for the past 24 hrs:  BP Temp src Pulse Resp SpO2 Height Weight  04/05/22 1442 122/75 -- (!) 58 -- -- -- --  04/05/22 1259 116/64 Oral 70 (!) 98 99 % -- --  04/05/22 1253 -- -- -- -- -- '5\' 3"'$  (1.6 m) 72.3 kg   Results for orders placed or performed during the hospital encounter of 04/05/22 (from the past 24 hour(s))  CBC     Status: None   Collection Time: 04/05/22  1:16 PM  Result Value Ref Range   WBC 8.5 4.0 - 10.5 K/uL   RBC 4.31 3.87 - 5.11 MIL/uL   Hemoglobin 12.5 12.0 - 15.0 g/dL   HCT 36.3 36.0 - 46.0 %   MCV 84.2 80.0 - 100.0 fL   MCH 29.0 26.0 - 34.0 pg   MCHC 34.4 30.0 - 36.0 g/dL   RDW 12.6 11.5 - 15.5 %   Platelets 187 150 - 400 K/uL   nRBC 0.0 0.0 - 0.2 %  Basic metabolic panel     Status: None   Collection Time: 04/05/22  1:16 PM  Result Value Ref Range   Sodium 136 135 - 145 mmol/L   Potassium 4.3 3.5 - 5.1 mmol/L   Chloride 103 98 - 111 mmol/L   CO2 23 22 - 32 mmol/L   Glucose, Bld 96 70 - 99 mg/dL   BUN 14 6 - 20 mg/dL   Creatinine, Ser 0.69 0.44 - 1.00 mg/dL   Calcium 9.3 8.9 - 10.3 mg/dL   GFR, Estimated >60 >60 mL/min   Anion gap 10 5 - 15  Urinalysis, Routine w reflex microscopic Urine, Clean Catch     Status: Abnormal   Collection Time: 04/05/22  1:20 PM  Result Value Ref Range   Color, Urine YELLOW YELLOW   APPearance HAZY (A) CLEAR   Specific Gravity, Urine 1.021 1.005 - 1.030   pH 6.0 5.0 - 8.0   Glucose, UA NEGATIVE NEGATIVE mg/dL   Hgb urine dipstick SMALL (A) NEGATIVE  Bilirubin Urine NEGATIVE NEGATIVE   Ketones, ur NEGATIVE NEGATIVE mg/dL   Protein, ur NEGATIVE NEGATIVE mg/dL   Nitrite NEGATIVE NEGATIVE   Leukocytes,Ua MODERATE (A) NEGATIVE   RBC / HPF 0-5 0  - 5 RBC/hpf   WBC, UA 0-5 0 - 5 WBC/hpf   Bacteria, UA FEW (A) NONE SEEN   Squamous Epithelial / LPF 0-5 0 - 5   Mucus PRESENT   Wet prep, genital     Status: Abnormal   Collection Time: 04/05/22  1:43 PM  Result Value Ref Range   Yeast Wet Prep HPF POC PRESENT (A) NONE SEEN   Trich, Wet Prep NONE SEEN NONE SEEN   Clue Cells Wet Prep HPF POC NONE SEEN NONE SEEN   WBC, Wet Prep HPF POC <10 <10   Sperm NONE SEEN    Meds ordered this encounter  Medications   terconazole (TERAZOL 3) 0.8 % vaginal cream    Sig: Place 1 applicator vaginally at bedtime. Apply nightly for three nights.    Dispense:  20 g    Refill:  0    Order Specific Question:   Supervising Provider    Answer:   Chancy Milroy [[id number]]   Assessment and Plan  --36 y.o. [id number] at [redacted]w[redacted]d --IUP confirmed 03/15/2022 --FHT via BSUS --Vulvovaginal candidiasis, rx to pharmacy --Blood type A POS, Rhogam not indicated --Hgb 12.5 --Discharge home in stable condition  F/U: --New OB scheduled for 04/27/2022  SDarlina Rumpf MAshland MSN, CNM 04/05/2022, 4:01 PM

## 2022-04-05 NOTE — MAU Note (Signed)
Kristy Hunt is a 36 y.o. at 22w6dhere in MAU reporting: she's spotting with wiping and having lower back pain.  Reports she's not feeling any abdominal cramping.  Reports took Tylenol for back pain last night.  Also states her color is pale, noticed around her eyes. LMP: N/A Onset of complaint: yesterday Pain score: 7 Vitals:   04/05/22 1259  BP: 116/64  Pulse: 70  Resp: (!) 98  SpO2: 99%     FHT:N/A Lab orders placed from triage:   UA

## 2022-04-06 LAB — GC/CHLAMYDIA PROBE AMP (~~LOC~~) NOT AT ARMC
Chlamydia: NEGATIVE
Comment: NEGATIVE
Comment: NORMAL
Neisseria Gonorrhea: NEGATIVE

## 2022-04-27 ENCOUNTER — Encounter: Payer: Self-pay | Admitting: Family Medicine

## 2022-04-27 ENCOUNTER — Other Ambulatory Visit (HOSPITAL_COMMUNITY)
Admission: RE | Admit: 2022-04-27 | Discharge: 2022-04-27 | Disposition: A | Discharge status: Home / Self Care | Payer: BC Managed Care – PPO | Source: Ambulatory Visit | Attending: Family Medicine | Admitting: Family Medicine

## 2022-04-27 ENCOUNTER — Encounter: Payer: Self-pay | Admitting: General Practice

## 2022-04-27 ENCOUNTER — Ambulatory Visit (INDEPENDENT_AMBULATORY_CARE_PROVIDER_SITE_OTHER): Payer: BC Managed Care – PPO | Admitting: Family Medicine

## 2022-04-27 VITALS — BP 114/74 | HR 73 | Wt 160.0 lb

## 2022-04-27 DIAGNOSIS — O99321 Drug use complicating pregnancy, first trimester: Secondary | ICD-10-CM

## 2022-04-27 DIAGNOSIS — O09891 Supervision of other high risk pregnancies, first trimester: Secondary | ICD-10-CM | POA: Diagnosis not present

## 2022-04-27 DIAGNOSIS — O09521 Supervision of elderly multigravida, first trimester: Secondary | ICD-10-CM | POA: Diagnosis not present

## 2022-04-27 DIAGNOSIS — F1111 Opioid abuse, in remission: Secondary | ICD-10-CM

## 2022-04-27 DIAGNOSIS — Z3A12 12 weeks gestation of pregnancy: Secondary | ICD-10-CM

## 2022-04-27 DIAGNOSIS — F1120 Opioid dependence, uncomplicated: Secondary | ICD-10-CM | POA: Insufficient documentation

## 2022-04-27 DIAGNOSIS — O09529 Supervision of elderly multigravida, unspecified trimester: Secondary | ICD-10-CM

## 2022-04-27 DIAGNOSIS — O09899 Supervision of other high risk pregnancies, unspecified trimester: Secondary | ICD-10-CM

## 2022-04-27 DIAGNOSIS — O99320 Drug use complicating pregnancy, unspecified trimester: Secondary | ICD-10-CM | POA: Diagnosis present

## 2022-04-27 LAB — HEPATITIS C ANTIBODY: HCV Ab: NEGATIVE

## 2022-04-27 LAB — OB RESULTS CONSOLE HEPATITIS B SURFACE ANTIGEN: Hepatitis B Surface Ag: NEGATIVE

## 2022-04-27 LAB — OB RESULTS CONSOLE RUBELLA ANTIBODY, IGM: Rubella: NON-IMMUNE/NOT IMMUNE

## 2022-04-27 LAB — OB RESULTS CONSOLE HIV ANTIBODY (ROUTINE TESTING): HIV: NONREACTIVE

## 2022-04-27 MED ORDER — ONDANSETRON 4 MG PO TBDP: 4.0000 mg | ORAL_TABLET | Freq: Four times a day (QID) | ORAL | 3 refills | Status: DC | PRN

## 2022-04-27 MED ORDER — ASPIRIN 81 MG PO TBEC: 81.0000 mg | DELAYED_RELEASE_TABLET | Freq: Every day | ORAL | 2 refills | Status: DC

## 2022-04-27 NOTE — Progress Notes (Signed)
DATING AND VIABILITY SONOGRAM   Kristy Hunt is a 36 y.o. year old [id number] with LMP Patient's last menstrual period was 01/17/2022 (approximate). which would correlate to  52w0dweeks gestation.  She has regular menstrual cycles.   She is here today for a confirmatory initial sonogram.    GESTATION: SINGLETON     FETAL ACTIVITY:          Heart rate         160 bpm          The fetus is active.    ADNEXA: The ovaries are normal.   GESTATIONAL AGE AND  BIOMETRICS:  Gestational criteria: Estimated Date of Delivery: 11/09/22 by early ultrasound now at 125w0dPrevious Scans:1      CROWN RUMP LENGTH           5.35 cm         12.0 weeks       5.59 cm 12.1 weeks                                                                           AVERAGE EGA(BY THIS SCAN):  12-0 weeks  WORKING EDD( early ultrasound ):  11/09/2022     TECHNICIAN COMMENTS: Patient informed that the ultrasound is considered a limited obstetric ultrasound and is not intended to be a complete ultrasound exam.  Patient also informed that the ultrasound is not being completed with the intent of assessing for fetal or placental anomalies or any pelvic abnormalities. Explained that the purpose of today's ultrasound is to assess for fetal heart rate.  Patient acknowledges the purpose of the exam and the limitations of the study.     JeKathrene Alu0/05/2022 9:48 AM

## 2022-04-27 NOTE — Progress Notes (Signed)
Subjective:  Kristy Hunt is a [id number] 60w0dby first trimester [place] seen today for her first obstetrical visit.  Her obstetrical history is significant for  3 prior vaginal deliveries . No complications to prior pregnancy. Obstetrical complications - AMA. 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 Has been weaning off subutex - on '2mg'$  per day. Is contemplating weaning completely off subutex. Patient does intend to breast feed. Pregnancy history fully reviewed.  Patient reports nausea.  BP 114/74   Pulse 73   Wt 160 lb (72.6 kg)   LMP 01/17/2022 (Approximate) Comment: 3 postive home preg tests  BMI 28.34 kg/m   HISTORY: OB History  Gravida Para Term Preterm AB Living  '5 3 3   1 3  '$ SAB IAB Ectopic Multiple Live Births  1       3    # Outcome Date GA Lbr Len/2nd Weight Sex Delivery Anes PTL Lv  5 Current           4 SAB 2018          3 Term 2014     Vag-Spont   LIV  2 Term 2011     Vag-Spont   LIV  1 Term 2005     Vag-Spont   LIV    Past Medical History:  Diagnosis Date   Bigeminal pulse    Complication of anesthesia    Migraine    Vaginal Pap smear, abnormal     Past Surgical History:  Procedure Laterality Date   CHOLECYSTECTOMY     DILATION AND EVACUATION N/A 03/09/2016   Procedure: DILATATION AND EVACUATION;  Surgeon: CLavonia Drafts MD;  [facility];  Service: Gynecology;  Laterality: N/A;   FOOT SURGERY     infection in foot removed      PATELLA FRACTURE SURGERY     TONSILLECTOMY      Family History  Problem Relation Age of Onset   Endometriosis Mother    High Cholesterol Mother    Diabetes Maternal Grandfather    Diabetes Paternal Grandmother      Exam  BP 114/74   Pulse 73   Wt 160 lb (72.6 kg)   LMP 01/17/2022 (Approximate) Comment: 3 postive home preg tests  BMI 28.34 kg/m   Chaperone present during exam  CONSTITUTIONAL: Well-developed, well-nourished female in no acute  distress.  HENT:  Normocephalic, atraumatic, External right and left ear normal. Oropharynx is clear and moist EYES: Conjunctivae and EOM are normal. Pupils are equal, round, and reactive to light. No scleral icterus.  NECK: Normal range of motion, supple, no masses.  Normal thyroid.  CARDIOVASCULAR: Normal heart rate noted, regular rhythm RESPIRATORY: Clear to auscultation bilaterally. Effort and breath sounds normal, no problems with respiration noted. BREASTS: Symmetric in size. No masses, skin changes, nipple drainage, or lymphadenopathy. ABDOMEN: Soft, normal bowel sounds, no distention noted.  No tenderness, rebound or guarding.  PELVIC: Normal appearing external genitalia; normal appearing vaginal mucosa and cervix. No abnormal discharge noted.  MUSCULOSKELETAL: Normal range of motion. No tenderness.  No cyanosis, clubbing, or edema.  2+ distal pulses. SKIN: Skin is warm and dry. No rash noted. Not diaphoretic. No erythema. No pallor. NEUROLOGIC: Alert and oriented to person, place, and time. Normal reflexes, muscle tone coordination. No cranial nerve deficit noted. PSYCHIATRIC: Normal mood and affect. Normal behavior. Normal judgment and thought content.    Assessment:    Pregnancy: [id number] Active Problem List  Diagnosis Date Noted   Supervision of other high risk pregnancies, unspecified trimester 04/27/2022   AMA (advanced maternal age) multigravida 35+ 04/27/2022   Pelvic pain in female 05/16/2016   Missed abortion       Plan:   1. Supervision of other high risk pregnancies, unspecified trimester FHT and FH normal Desires BTL post delivery. - Culture, OB Urine - [facility]orama Prenatal Test Full Panel - [facility] - [facility] AMB BABYSCRIPTS OPT IN - [place] MFM OB DETAIL +14 WK; Future - Cytology - PAP( [facility]) - CBC/D/Plt+RPR+Rh+ABO+RubIgG...  2. Suboxone maintenance treatment complicating pregnancy, antepartum, unspecified trimester ([place]) Continue  suboxone. Discussed NAS. May wean off during pregnancy. - Culture, OB Urine - [facility]ama Prenatal Test Full Panel - [facility] - [facility] AMB BABYSCRIPTS OPT IN - [place] MFM OB DETAIL +14 WK; Future - Cytology - PAP( [facility]) - CBC/D/Plt+RPR+Rh+ABO+RubIgG...  3. [redacted] weeks gestation of pregnancy - Culture, OB Urine - Pan[facility]ama Prenatal Test Full Panel - [facility] - [facility] AMB BABYSCRIPTS OPT IN - [place] MFM OB DETAIL +14 WK; Future - Cytology - PAP( [facility]) - CBC/D/Plt+RPR+Rh+ABO+RubIgG...  4. Antepartum multigravida of advanced maternal age ASA '81mg'$  - CBC/D/Plt+RPR+Rh+ABO+RubIgG...    Initial labs obtained Continue prenatal vitamins Reviewed n/v relief measures and warning s/s to report Reviewed recommended weight gain based on pre-gravid BMI Encouraged well-balanced diet Genetic & carrier screening discussed: requests , Panor[facility] Ultrasound discussed; fetal survey: requested [place] completed> form faxed if has or is planning to apply for medicaid The nature of [place] for [employer] with multiple MDs and other Advanced Practice Providers was explained to patient; also emphasized that fellows, residents, and students are part of our team.   Problem list reviewed and updated. 75% of 30 min visit spent on counseling and coordination of care.     Truett Mainland 04/27/2022

## 2022-04-28 ENCOUNTER — Encounter: Payer: Self-pay | Admitting: Family Medicine

## 2022-04-28 DIAGNOSIS — O09899 Supervision of other high risk pregnancies, unspecified trimester: Secondary | ICD-10-CM | POA: Insufficient documentation

## 2022-04-28 LAB — CBC/D/PLT+RPR+RH+ABO+RUBIGG...
Antibody Screen: NEGATIVE
Basophils Absolute: 0 10*3/uL (ref 0.0–0.2)
Basos: 0 %
EOS (ABSOLUTE): 0 10*3/uL (ref 0.0–0.4)
Eos: 0 %
HCV Ab: NONREACTIVE
HIV Screen 4th Generation wRfx: NONREACTIVE
Hematocrit: 37.9 % (ref 34.0–46.6)
Hemoglobin: 13 g/dL (ref 11.1–15.9)
Hepatitis B Surface Ag: NEGATIVE
Immature Grans (Abs): 0 10*3/uL (ref 0.0–0.1)
Immature Granulocytes: 0 %
Lymphocytes Absolute: 1.2 10*3/uL (ref 0.7–3.1)
Lymphs: 18 %
MCH: 29.1 pg (ref 26.6–33.0)
MCHC: 34.3 g/dL (ref 31.5–35.7)
MCV: 85 fL (ref 79–97)
Monocytes Absolute: 0.4 10*3/uL (ref 0.1–0.9)
Monocytes: 5 %
Neutrophils Absolute: 5.2 10*3/uL (ref 1.4–7.0)
Neutrophils: 77 %
Platelets: 174 10*3/uL (ref 150–450)
RBC: 4.46 x10E6/uL (ref 3.77–5.28)
RDW: 11.9 % (ref 11.7–15.4)
RPR Ser Ql: NONREACTIVE
Rh Factor: POSITIVE
Rubella Antibodies, IGG: <0.9 index — ABNORMAL LOW (ref >0.99)
WBC: 6.8 10*3/uL (ref 3.4–10.8)

## 2022-04-28 LAB — HCV INTERPRETATION

## 2022-04-29 LAB — CULTURE, OB URINE

## 2022-04-29 LAB — URINE CULTURE, OB REFLEX: Organism ID, Bacteria: NO GROWTH

## 2022-05-03 LAB — PANORAMA PRENATAL TEST FULL PANEL:PANORAMA TEST PLUS 5 ADDITIONAL MICRODELETIONS: FETAL FRACTION: 10.3

## 2022-05-03 LAB — CYTOLOGY - PAP
Chlamydia: NEGATIVE
Comment: NEGATIVE
Comment: NEGATIVE
Comment: NEGATIVE
Comment: NEGATIVE
Comment: NORMAL
Diagnosis: NEGATIVE
HPV 16: NEGATIVE
HPV 18 / 45: NEGATIVE
High risk HPV: POSITIVE — AB
Neisseria Gonorrhea: NEGATIVE

## 2022-05-04 ENCOUNTER — Encounter: Payer: Self-pay | Admitting: Family Medicine

## 2022-05-04 DIAGNOSIS — R87810 Cervical high risk human papillomavirus (HPV) DNA test positive: Secondary | ICD-10-CM | POA: Insufficient documentation

## 2022-05-04 LAB — HORIZON CUSTOM: REPORT SUMMARY: NEGATIVE

## 2022-05-20 ENCOUNTER — Encounter (HOSPITAL_COMMUNITY): Payer: Self-pay | Admitting: Obstetrics and Gynecology

## 2022-05-20 ENCOUNTER — Inpatient Hospital Stay (HOSPITAL_COMMUNITY)
Admission: AD | Admit: 2022-05-20 | Discharge: 2022-05-20 | Disposition: A | Discharge status: Home / Self Care | Payer: Medicaid Other | Attending: Obstetrics and Gynecology | Admitting: Obstetrics and Gynecology

## 2022-05-20 ENCOUNTER — Other Ambulatory Visit: Payer: Self-pay

## 2022-05-20 DIAGNOSIS — R102 Pelvic and perineal pain unspecified side: Secondary | ICD-10-CM

## 2022-05-20 DIAGNOSIS — Z3A15 15 weeks gestation of pregnancy: Secondary | ICD-10-CM | POA: Diagnosis not present

## 2022-05-20 DIAGNOSIS — O26892 Other specified pregnancy related conditions, second trimester: Secondary | ICD-10-CM | POA: Insufficient documentation

## 2022-05-20 DIAGNOSIS — O09522 Supervision of elderly multigravida, second trimester: Secondary | ICD-10-CM | POA: Diagnosis not present

## 2022-05-20 DIAGNOSIS — O26899 Other specified pregnancy related conditions, unspecified trimester: Secondary | ICD-10-CM

## 2022-05-20 HISTORY — DX: Opioid abuse, in remission: F11.11

## 2022-05-20 LAB — URINALYSIS, ROUTINE W REFLEX MICROSCOPIC
Bilirubin Urine: NEGATIVE
Glucose, UA: NEGATIVE mg/dL
Hgb urine dipstick: NEGATIVE
Ketones, ur: NEGATIVE mg/dL
Leukocytes,Ua: NEGATIVE
Nitrite: NEGATIVE
Protein, ur: NEGATIVE mg/dL
Specific Gravity, Urine: 1.018 (ref 1.005–1.030)
pH: 6 (ref 5.0–8.0)

## 2022-05-20 NOTE — MAU Note (Signed)
Kristy Hunt is a 36 y.o. at 37w2dhere in MAU reporting: yesterday started feeling some pressure. Last night and into today has been having sharp pains and this afternoon it is more constant. No bleeding or LOF.   Onset of complaint: yesterday  Pain score: 7/10  Vitals:   05/20/22 1806  BP: 110/68  Pulse: 71  Resp: 16  Temp: 98.3 F (36.8 C)  SpO2: 98%     FHT:152  Lab orders placed from triage: ua

## 2022-05-20 NOTE — MAU Provider Note (Signed)
History     CSN: [id number]  Arrival date and time: 05/20/22 1732   Event Date/Time   First Provider Initiated Contact with Patient 05/20/22 1814      Chief Complaint  Patient presents with   Pelvic Pain   HPI  Kristy Hunt is a 36 y.o. [id number] at 39w2dwho presents for evaluation of lower abdominal pain. Patient reports sharp shooting abdominal pain on both sides of her lower abdomen. She reports she has been on her feet working most of the day. Patient rates the pain as a 7/10 and has not tried anything for the pain. She denies any vaginal bleeding, discharge, and leaking of fluid. Denies any constipation, diarrhea or any urinary complaints. Reports normal fetal movement.   OB History     Gravida  5   Para  3   Term  3   Preterm      AB  1   Living  3      SAB  1   IAB      Ectopic      Multiple      Live Births  3           Past Medical History:  Diagnosis Date   Bigeminal pulse    Complication of anesthesia    Migraine    Vaginal Pap smear, abnormal     Past Surgical History:  Procedure Laterality Date   CHOLECYSTECTOMY     DILATION AND EVACUATION N/A 03/09/2016   Procedure: DILATATION AND EVACUATION;  Surgeon: CLavonia Drafts MD;  [facility];  Service: Gynecology;  Laterality: N/A;   FOOT SURGERY     infection in foot removed      PATELLA FRACTURE SURGERY     TONSILLECTOMY      Family History  Problem Relation Age of Onset   Endometriosis Mother    High Cholesterol Mother    Diabetes Maternal Grandfather    Diabetes Paternal Grandmother     Social History   Tobacco Use   Smoking status: Never   Smokeless tobacco: Never  Vaping Use   Vaping Use: Never used  Substance Use Topics   Alcohol use: Not Currently   Drug use: Not Currently    Comment: in recovery for opiate abuse    Allergies:  Allergies  Allergen Reactions   Dilaudid [Hydromorphone Hcl] Hives and Shortness Of Breath   Hydromorphone Hcl  Shortness Of Breath and Hives   Morphine And Related Hives and Shortness Of Breath    Pt states she can tolerate Percocet   Other Shortness Of Breath    Other reaction(s): Hives Pt states she can tolerate Percocet   Epinephrine     Heart flutters     Medications Prior to Admission  Medication Sig Dispense Refill Last Dose   acetaminophen (TYLENOL) 325 MG tablet Take 1,000 mg by mouth every 6 (six) hours as needed.   05/20/2022   aspirin EC 81 MG tablet Take 1 tablet (81 mg total) by mouth daily. Take after 12 weeks for prevention of preeclampsia later in pregnancy 300 tablet 2 05/20/2022   buprenorphine (SUBUTEX) 8 MG SUBL SL tablet Place 1 mg under the tongue 2 (two) times daily.  0 05/20/2022   ondansetron (ZOFRAN-ODT) 4 MG disintegrating tablet Take 1 tablet (4 mg total) by mouth every 6 (six) hours as needed for nausea. 30 tablet 3 Past Week   Prenatal Vit-Fe Fumarate-FA (PRENATAL MULTIVITAMIN) TABS tablet Take 1 tablet by mouth daily  at 12 noon.   05/20/2022   amoxicillin-clavulanate (AUGMENTIN) 500-125 MG tablet Take 1 tablet by mouth 2 (two) times daily.      promethazine (PHENERGAN) 25 MG tablet Take 0.5-1 tablets (12.5-25 mg total) by mouth every 6 (six) hours as needed for nausea or vomiting. (Patient not taking: Reported on 04/27/2022) 30 tablet 3    terconazole (TERAZOL 3) 0.8 % vaginal cream Place 1 applicator vaginally at bedtime. Apply nightly for three nights. (Patient not taking: Reported on 04/27/2022) 20 g 0     Review of Systems  Constitutional: Negative.  Negative for fatigue and fever.  HENT: Negative.    Respiratory: Negative.  Negative for shortness of breath.   Cardiovascular: Negative.  Negative for chest pain.  Gastrointestinal:  Positive for abdominal pain. Negative for constipation, diarrhea, nausea and vomiting.  Genitourinary:  Positive for pelvic pain. Negative for dysuria, vaginal bleeding and vaginal discharge.  Neurological: Negative.  Negative for  dizziness and headaches.   Physical Exam   Blood pressure 110/68, pulse 71, temperature 98.3 F (36.8 C), temperature source Oral, resp. rate 16, height '5\' 3"'$  (1.6 m), weight 72.3 kg, last menstrual period 01/17/2022, SpO2 98 %, unknown if currently breastfeeding.  Patient Vitals for the past 24 hrs:  BP Temp Temp src Pulse Resp SpO2 Height Weight  05/20/22 1806 110/68 98.3 F (36.8 C) Oral 71 16 98 % -- --  05/20/22 1800 -- -- -- -- -- -- '5\' 3"'$  (1.6 m) 72.3 kg    Physical Exam Vitals and nursing note reviewed.  Constitutional:      General: She is not in acute distress.    Appearance: She is well-developed.  HENT:     Head: Normocephalic.  Eyes:     Pupils: Pupils are equal, round, and reactive to light.  Cardiovascular:     Rate and Rhythm: Normal rate and regular rhythm.     Heart sounds: Normal heart sounds.  Pulmonary:     Effort: Pulmonary effort is normal. No respiratory distress.     Breath sounds: Normal breath sounds.  Abdominal:     General: Bowel sounds are normal. There is no distension.     Palpations: Abdomen is soft.     Tenderness: There is no abdominal tenderness.  Skin:    General: Skin is warm and dry.  Neurological:     Mental Status: She is alert and oriented to person, place, and time.  Psychiatric:        Mood and Affect: Mood normal.        Behavior: Behavior normal.        Thought Content: Thought content normal.        Judgment: Judgment normal.     FHT: 152 bpm  MAU Course  Procedures  Results for orders placed or performed during the hospital encounter of 05/20/22 (from the past 24 hour(s))  Urinalysis, Routine w reflex microscopic Urine, Clean Catch     Status: Abnormal   Collection Time: 05/20/22  5:42 PM  Result Value Ref Range   Color, Urine YELLOW YELLOW   APPearance HAZY (A) CLEAR   Specific Gravity, Urine 1.018 1.005 - 1.030   pH 6.0 5.0 - 8.0   Glucose, UA NEGATIVE NEGATIVE mg/dL   Hgb urine dipstick NEGATIVE NEGATIVE    Bilirubin Urine NEGATIVE NEGATIVE   Ketones, ur NEGATIVE NEGATIVE mg/dL   Protein, ur NEGATIVE NEGATIVE mg/dL   Nitrite NEGATIVE NEGATIVE   Leukocytes,Ua NEGATIVE NEGATIVE  MDM Labs ordered and reviewed.   UA Cervix closed/thick/posterior  CNM discussed recommendations for pregnancy support belt.   Assessment and Plan   1. Pain of round ligament affecting pregnancy, antepartum   2. [redacted] weeks gestation of pregnancy     -Discharge home in stable condition -Abdominal pain precautions discussed -Patient advised to follow-up with OB as scheduled for prenatal care -Patient may return to MAU as needed or if her condition were to change or worsen  Wende Mott, CNM 05/20/2022, 6:14 PM

## 2022-05-20 NOTE — Discharge Instructions (Signed)

## 2022-06-01 ENCOUNTER — Ambulatory Visit (INDEPENDENT_AMBULATORY_CARE_PROVIDER_SITE_OTHER): Payer: BC Managed Care – PPO | Admitting: Family Medicine

## 2022-06-01 VITALS — BP 112/66 | HR 64 | Wt 158.0 lb

## 2022-06-01 DIAGNOSIS — O09899 Supervision of other high risk pregnancies, unspecified trimester: Secondary | ICD-10-CM

## 2022-06-01 DIAGNOSIS — O09529 Supervision of elderly multigravida, unspecified trimester: Secondary | ICD-10-CM

## 2022-06-01 DIAGNOSIS — O09522 Supervision of elderly multigravida, second trimester: Secondary | ICD-10-CM

## 2022-06-01 DIAGNOSIS — F1111 Opioid abuse, in remission: Secondary | ICD-10-CM

## 2022-06-01 DIAGNOSIS — R87810 Cervical high risk human papillomavirus (HPV) DNA test positive: Secondary | ICD-10-CM

## 2022-06-01 DIAGNOSIS — Z3A17 17 weeks gestation of pregnancy: Secondary | ICD-10-CM

## 2022-06-01 DIAGNOSIS — O09892 Supervision of other high risk pregnancies, second trimester: Secondary | ICD-10-CM

## 2022-06-01 DIAGNOSIS — Z2839 Other underimmunization status: Secondary | ICD-10-CM

## 2022-06-01 NOTE — Progress Notes (Signed)
   PRENATAL VISIT NOTE  Subjective:  Kristy Hunt is a 36 y.o. G5P3013 at [redacted]w[redacted]d being seen today for ongoing prenatal care.  She is currently monitored for the following issues for this high-risk pregnancy and has Missed abortion; Pelvic pain in female; Supervision of other high risk pregnancies, unspecified trimester; AMA (advanced maternal age) multigravida 35+; Opioid use disorder, mild, in sustained remission, on maintenance therapy (HCC); Rubella non-immune status, antepartum; and Cervical high risk HPV (human papillomavirus) test positive on their problem list.  Patient reports  occasional sharp lower abdominal pain, particularly on right side .  Contractions: Not present. Vag. Bleeding: None.  Movement: Present. Denies leaking of fluid.   The following portions of the patient's history were reviewed and updated as appropriate: allergies, current medications, past family history, past medical history, past social history, past surgical history and problem list.   Objective:   Vitals:   06/01/22 0838  BP: 112/66  Pulse: 64  Weight: 158 lb (71.7 kg)    Fetal Status: Fetal Heart Rate (bpm): 140   Movement: Present     General:  Alert, oriented and cooperative. Patient is in no acute distress.  Skin: Skin is warm and dry. No rash noted.   Cardiovascular: Normal heart rate noted  Respiratory: Normal respiratory effort, no problems with respiration noted  Abdomen: Soft, gravid, appropriate for gestational age.  Pain/Pressure: Present (pt reports sharp pains in stomach)     Pelvic: Cervical exam deferred        Extremities: Normal range of motion.  Edema: None  Mental Status: Normal mood and affect. Normal behavior. Normal judgment and thought content.   Assessment and Plan:  Pregnancy: G5P3013 at [redacted]w[redacted]d 1. [redacted] weeks gestation of pregnancy  2. Supervision of other high risk pregnancies, unspecified trimester FHT and FH normal. Will watch tenderness - has it on right round  ligament. - AFP, Serum, Open Spina Bifida  3. Antepartum multigravida of advanced maternal age ASA 81mg  4. Opioid use disorder, mild, in sustained remission, on maintenance therapy (HCC) Stable.  Is contemplating weaning off during end of 2nd trimester Her current suboxone provider is leaving - may be switching to the other provider in the practice. She will see if that provider is comfortable prescribing during pregnancy  5. Rubella non-immune status, antepartum MMR post delivery  6. Cervical high risk HPV (human papillomavirus) test positive Repeat PAP in 1 year.    Preterm labor symptoms and general obstetric precautions including but not limited to vaginal bleeding, contractions, leaking of fluid and fetal movement were reviewed in detail with the patient. Please refer to After Visit Summary for other counseling recommendations.   No follow-ups on file.  Future Appointments  Date Time Provider Department Center  06/15/2022  1:15 PM [facility] NURSE [facility]  06/15/2022  1:30 PM [facility] US3 [facility]  06/30/2022  2:30 PM Stinson, Jacob J, DO [facility] None  07/27/2022  1:30 PM Stinson, Jacob J, DO [facility] None  08/17/2022  8:15 AM Stinson, Jacob J, DO [facility] None    Jacob J Stinson, DO  

## 2022-06-01 NOTE — Progress Notes (Signed)
Patient states that she is having sharp pain in her stomach. Kathrene Alu RN

## 2022-06-02 ENCOUNTER — Encounter: Payer: BC Managed Care – PPO | Admitting: Family Medicine

## 2022-06-03 LAB — AFP, SERUM, OPEN SPINA BIFIDA
AFP MoM: 1.26
AFP Value: 47.1 ng/mL
Gest. Age on Collection Date: 17 weeks
Maternal Age At EDD: 38.1 yr
OSBR Risk 1 IN: 5411
Test Results:: NEGATIVE
Weight: 158 [lb_av]

## 2022-06-11 ENCOUNTER — Encounter: Payer: Self-pay | Admitting: Student

## 2022-06-13 ENCOUNTER — Inpatient Hospital Stay (HOSPITAL_BASED_OUTPATIENT_CLINIC_OR_DEPARTMENT_OTHER): Payer: Medicaid Other

## 2022-06-13 ENCOUNTER — Inpatient Hospital Stay (HOSPITAL_COMMUNITY)
Admission: AD | Admit: 2022-06-13 | Discharge: 2022-06-13 | Disposition: A | Discharge status: Home / Self Care | Payer: Medicaid Other | Attending: Obstetrics & Gynecology | Admitting: Obstetrics & Gynecology

## 2022-06-13 ENCOUNTER — Telehealth: Payer: Self-pay

## 2022-06-13 ENCOUNTER — Encounter (HOSPITAL_COMMUNITY): Payer: Self-pay | Admitting: Obstetrics & Gynecology

## 2022-06-13 DIAGNOSIS — O358XX0 Maternal care for other (suspected) fetal abnormality and damage, not applicable or unspecified: Secondary | ICD-10-CM

## 2022-06-13 DIAGNOSIS — Z3A18 18 weeks gestation of pregnancy: Secondary | ICD-10-CM | POA: Insufficient documentation

## 2022-06-13 DIAGNOSIS — R102 Pelvic and perineal pain: Secondary | ICD-10-CM | POA: Diagnosis not present

## 2022-06-13 DIAGNOSIS — R109 Unspecified abdominal pain: Secondary | ICD-10-CM | POA: Diagnosis not present

## 2022-06-13 DIAGNOSIS — O26899 Other specified pregnancy related conditions, unspecified trimester: Secondary | ICD-10-CM | POA: Insufficient documentation

## 2022-06-13 DIAGNOSIS — O99891 Other specified diseases and conditions complicating pregnancy: Secondary | ICD-10-CM | POA: Diagnosis not present

## 2022-06-13 DIAGNOSIS — Z369 Encounter for antenatal screening, unspecified: Secondary | ICD-10-CM

## 2022-06-13 LAB — WET PREP, GENITAL
Clue Cells Wet Prep HPF POC: NONE SEEN
Sperm: NONE SEEN
Trich, Wet Prep: NONE SEEN
WBC, Wet Prep HPF POC: <10 (ref <10)
Yeast Wet Prep HPF POC: NONE SEEN

## 2022-06-13 LAB — URINALYSIS, ROUTINE W REFLEX MICROSCOPIC
Bilirubin Urine: NEGATIVE
Glucose, UA: NEGATIVE mg/dL
Hgb urine dipstick: NEGATIVE
Ketones, ur: NEGATIVE mg/dL
Leukocytes,Ua: NEGATIVE
Nitrite: NEGATIVE
Protein, ur: NEGATIVE mg/dL
Specific Gravity, Urine: 1.02 (ref 1.005–1.030)
pH: 6 (ref 5.0–8.0)

## 2022-06-13 NOTE — MAU Note (Signed)
Kristy Hunt is a 36 y.o. at 72w5dhere in MAU reporting: having the sharp pain again, lower abd, mid to rt side. Last couple wks hasn't had an appetite, no nausea or vomiting. Concerned that she is not gaining weight. Feeling pressure in vagina.  Had felt some flutters when laying down a couple wks ago.  Not feeling them now. Doesn't really feel pregnant.  Office couldn't get her in until Wed, she didn't feel like she could wait.  No bleeding. Onset of complaint: ongoing Pain score: 7 Vitals:   06/13/22 1731  BP: 121/66  Pulse: 83  Resp: 16  Temp: 98.3 F (36.8 C)  SpO2: 98%     FHT:148 Lab orders placed from triage:

## 2022-06-13 NOTE — Telephone Encounter (Signed)
Patient called and states she is not eating much and just doesn't feel right. Patient encouraged to eat small meals throughout the day and set timer for these.  Patient persistent saying she just doesn't feel right. Patient placed on schedule and has anatomy scan on Wednesday Nov 29th.  Denies any bleeding or leaking of fluid. Kathrene Alu RN

## 2022-06-13 NOTE — MAU Provider Note (Signed)
History     CSN: [id number]  Arrival date and time: 06/13/22 1719   Event Date/Time   First Provider Initiated Contact with Patient 06/13/22 1739      Chief Complaint  Patient presents with   Abdominal Pain   no appetite   vaginal pressure   36 y.o. [id number] '@18'$ .5 wks presenting with LAP. She is also concerned because she doesn't feel pregnant. Reports pain is intermittent, sharp and in RLQ. Pain has been ongoing x1 month. Denies urinary sx. Denies VB, spotting, or abnormal discharge. Reports feeling flutters some days but not others. Endorses poor appetite. Reports increased stressed d/t this being unplanned pregnancy and having to adjust.    OB History     Gravida  5   Para  3   Term  3   Preterm      AB  1   Living  3      SAB  1   IAB      Ectopic      Multiple      Live Births  3           Past Medical History:  Diagnosis Date   Bigeminal pulse    Complication of anesthesia    Migraine    Opioid use disorder, mild, in sustained remission, on maintenance therapy ([place])    Vaginal Pap smear, abnormal     Past Surgical History:  Procedure Laterality Date   CHOLECYSTECTOMY     DILATION AND EVACUATION N/A 03/09/2016   Procedure: DILATATION AND EVACUATION;  Surgeon: Lavonia Drafts, MD;  Location: [facility];  Service: Gynecology;  Laterality: N/A;   FOOT SURGERY     PATELLA FRACTURE SURGERY     TONSILLECTOMY      Family History  Problem Relation Age of Onset   Endometriosis Mother    High Cholesterol Mother    Diabetes Maternal Grandfather    Diabetes Paternal Grandmother     Social History   Tobacco Use   Smoking status: Never   Smokeless tobacco: Never  Vaping Use   Vaping Use: Never used  Substance Use Topics   Alcohol use: Not Currently   Drug use: Not Currently    Comment: in recovery for opiate abuse    Allergies:  Allergies  Allergen Reactions   Dilaudid [Hydromorphone Hcl] Hives and Shortness Of Breath    Hydromorphone Hcl Shortness Of Breath and Hives   Morphine And Related Hives and Shortness Of Breath    Pt states she can tolerate Percocet   Other Shortness Of Breath    Other reaction(s): Hives Pt states she can tolerate Percocet   Epinephrine     Heart flutters     Medications Prior to Admission  Medication Sig Dispense Refill Last Dose   acetaminophen (TYLENOL) 325 MG tablet Take 1,000 mg by mouth every 6 (six) hours as needed.      aspirin EC 81 MG tablet Take 1 tablet (81 mg total) by mouth daily. Take after 12 weeks for prevention of preeclampsia later in pregnancy 300 tablet 2    buprenorphine (SUBUTEX) 8 MG SUBL SL tablet Place 1 mg under the tongue 2 (two) times daily.  0    ondansetron (ZOFRAN-ODT) 4 MG disintegrating tablet Take 1 tablet (4 mg total) by mouth every 6 (six) hours as needed for nausea. (Patient not taking: Reported on 06/01/2022) 30 tablet 3    Prenatal Vit-Fe Fumarate-FA (PRENATAL MULTIVITAMIN) TABS tablet Take 1 tablet by mouth daily at  12 noon.      promethazine (PHENERGAN) 25 MG tablet Take 0.5-1 tablets (12.5-25 mg total) by mouth every 6 (six) hours as needed for nausea or vomiting. (Patient not taking: Reported on 04/27/2022) 30 tablet 3     Review of Systems  Constitutional:  Positive for appetite change.  Gastrointestinal:  Positive for abdominal pain.  Genitourinary:  Negative for dysuria, hematuria, urgency, vaginal bleeding and vaginal discharge.   Physical Exam   Blood pressure 121/66, pulse 83, temperature 98.3 F (36.8 C), temperature source Oral, resp. rate 16, height '5\' 3"'$  (1.6 m), weight 71.7 kg, last menstrual period 01/17/2022, SpO2 98 %, unknown if currently breastfeeding.  Physical Exam Vitals and nursing note reviewed. Exam conducted with a chaperone present.  Constitutional:      General: She is not in acute distress.    Appearance: Normal appearance.  HENT:     Head: Normocephalic and atraumatic.  Cardiovascular:     Rate and  Rhythm: Normal rate.  Pulmonary:     Effort: Pulmonary effort is normal. No respiratory distress.  Abdominal:     General: There is no distension.     Palpations: Abdomen is soft. There is no mass.     Tenderness: There is no abdominal tenderness. There is no guarding or rebound.     Hernia: No hernia is present.     Comments: Fundus U/2  Musculoskeletal:        General: Normal range of motion.     Cervical back: Normal range of motion.  Skin:    General: Skin is warm and dry.  Neurological:     General: No focal deficit present.     Mental Status: She is alert and oriented to person, place, and time.  Psychiatric:        Mood and Affect: Mood normal.        Behavior: Behavior normal.   FHT 148  Results for orders placed or performed during the hospital encounter of 06/13/22 (from the past 24 hour(s))  Wet prep, genital     Status: None   Collection Time: 06/13/22  6:22 PM   Specimen: PATH Cytology Cervicovaginal Ancillary Only  Result Value Ref Range   Yeast Wet Prep HPF POC NONE SEEN NONE SEEN   Trich, Wet Prep NONE SEEN NONE SEEN   Clue Cells Wet Prep HPF POC NONE SEEN NONE SEEN   WBC, Wet Prep HPF POC <10 <10   Sperm NONE SEEN   Urinalysis, Routine w reflex microscopic Urine, Clean Catch     Status: None   Collection Time: 06/13/22  6:23 PM  Result Value Ref Range   Color, Urine YELLOW YELLOW   APPearance CLEAR CLEAR   Specific Gravity, Urine 1.020 1.005 - 1.030   pH 6.0 5.0 - 8.0   Glucose, UA NEGATIVE NEGATIVE mg/dL   Hgb urine dipstick NEGATIVE NEGATIVE   Bilirubin Urine NEGATIVE NEGATIVE   Ketones, ur NEGATIVE NEGATIVE mg/dL   Protein, ur NEGATIVE NEGATIVE mg/dL   Nitrite NEGATIVE NEGATIVE   Leukocytes,Ua NEGATIVE NEGATIVE     Media Information     MAU Course  Procedures  MDM Normal labs and [place]. No sings of threatened SAB. Suspect pain is MSK or RL, discussed comfort measures. Pt reassured no signs of pregnancy complications identified today. Stable  for discharge home.   Assessment and Plan   1. [redacted] weeks gestation of pregnancy   2. Pain of round ligament affecting pregnancy, antepartum    Discharge home  Follow up at [facility] as scheduled SAB precautions  Allergies as of 06/13/2022       Reactions   Dilaudid [hydromorphone Hcl] Hives, Shortness Of Breath   Hydromorphone Hcl Shortness Of Breath, Hives   Morphine And Related Hives, Shortness Of Breath   Pt states she can tolerate Percocet   Other Shortness Of Breath   Other reaction(s): Hives Pt states she can tolerate Percocet   Epinephrine    Heart flutters         Medication List     STOP taking these medications    buprenorphine 8 MG Subl SL tablet Commonly known as: SUBUTEX       TAKE these medications    acetaminophen 325 MG tablet Commonly known as: TYLENOL Take 1,000 mg by mouth every 6 (six) hours as needed.   aspirin EC 81 MG tablet Take 1 tablet (81 mg total) by mouth daily. Take after 12 weeks for prevention of preeclampsia later in pregnancy   ondansetron 4 MG disintegrating tablet Commonly known as: ZOFRAN-ODT Take 1 tablet (4 mg total) by mouth every 6 (six) hours as needed for nausea.   prenatal multivitamin Tabs tablet Take 1 tablet by mouth daily at 12 noon.   promethazine 25 MG tablet Commonly known as: PHENERGAN Take 0.5-1 tablets (12.5-25 mg total) by mouth every 6 (six) hours as needed for nausea or vomiting.        Julianne Handler, CNM 06/13/2022, 7:31 PM

## 2022-06-14 LAB — GC/CHLAMYDIA PROBE AMP (~~LOC~~) NOT AT ARMC
Chlamydia: NEGATIVE
Comment: NEGATIVE
Comment: NORMAL
Neisseria Gonorrhea: NEGATIVE

## 2022-06-15 ENCOUNTER — Encounter: Payer: Medicaid Other | Admitting: Obstetrics and Gynecology

## 2022-06-15 ENCOUNTER — Ambulatory Visit (HOSPITAL_BASED_OUTPATIENT_CLINIC_OR_DEPARTMENT_OTHER): Payer: Medicaid Other | Admitting: Maternal & Fetal Medicine

## 2022-06-15 ENCOUNTER — Ambulatory Visit: Payer: Medicaid Other | Admitting: *Deleted

## 2022-06-15 ENCOUNTER — Encounter: Payer: Self-pay | Admitting: Family Medicine

## 2022-06-15 ENCOUNTER — Ambulatory Visit: Payer: Medicaid Other | Attending: Family Medicine

## 2022-06-15 VITALS — BP 125/76 | HR 75

## 2022-06-15 DIAGNOSIS — O09529 Supervision of elderly multigravida, unspecified trimester: Secondary | ICD-10-CM | POA: Diagnosis present

## 2022-06-15 DIAGNOSIS — O09899 Supervision of other high risk pregnancies, unspecified trimester: Secondary | ICD-10-CM

## 2022-06-15 DIAGNOSIS — Z3A12 12 weeks gestation of pregnancy: Secondary | ICD-10-CM | POA: Insufficient documentation

## 2022-06-15 DIAGNOSIS — F1120 Opioid dependence, uncomplicated: Secondary | ICD-10-CM | POA: Diagnosis not present

## 2022-06-15 DIAGNOSIS — Z3A19 19 weeks gestation of pregnancy: Secondary | ICD-10-CM

## 2022-06-15 DIAGNOSIS — O35EXX0 Maternal care for other (suspected) fetal abnormality and damage, fetal genitourinary anomalies, not applicable or unspecified: Secondary | ICD-10-CM | POA: Insufficient documentation

## 2022-06-15 DIAGNOSIS — O99320 Drug use complicating pregnancy, unspecified trimester: Secondary | ICD-10-CM | POA: Insufficient documentation

## 2022-06-15 NOTE — Progress Notes (Signed)
MFM Consult Note Patient Name: Kristy Hunt  Patient MRN:   [id number]  Referring provider: [facility]  Reason for Consult: UTDA1   HPI: Kristy Hunt is a 36 y.o. N2T5573 at [redacted]w[redacted]d here for ultrasound and consultation. She has no concerns today other than the UTDA1.  Bilateral urinary tract dilation was seen on today's ultrasound.  The right kidney measures 4.5 mm from anterior-posterior diameter of the renal pelvis compared to 5 mm on the left.  There is no evidence of dilation of the renal calyces or ureters. Normal renal parenchyma is seen bilaterally. There is no evidence of a ureterocele or duplicated collecting system.  Amniotic fluid level is normal.  No other identifiable soft markers were seen.  These findings are most consistent with urinary tract dilation type A1.    I discussed these ultrasound findings with the patient.  Urinary tract dilation is identified up to 1 to 2% of pregnancies and reflects a spectrum of possible uropathologies.  I discussed the possible causes including transient/physiologic, ureteropelvic junction obstruction, vesicular ureteral reflux, ureterovesical junction obstruction and posterior urethral valves.  Much of the time the exact cause is not known prenatally due to lack of ability to perform the proper imaging.  Approximately 90% of cases of isolated pelviectasis will resolve, however, nearly 90% will persist if the renal pelvis is greater than 10 mm. With severe renal dilation (>15 mm), in utero resolution is unlikely. Among fetuses with >7 mm renal dilation, approximately 40% will resolve or improve over the course of the pregnancy, 50% will remain unchanged, and 10% will worsen. One third of these fetuses will require postnatal urologic surgery when there is persistent severe hydronephrosis (>135m.  While outcomes are difficult to predict, the larger of the measurement of the renal pelvis the more likely it is to be caused by obstruction which has a  greater risk of requiring surgery as well as other complications. We did not go into specific treatments after birth since the UTD is mild at this time. If it becomes severe we can continue to cousel on what to expect after delivery.   Sonographic findings Single intrauterine pregnancy. Observed fetal cardiac activity. Cephalic presentation. Bilateral urinary tract dilation was seen on today's ultrasound.  The right kidney measures 4.5 mm from anterior-posterior diameter of the renal pelvis compared to 5 mm on the left.  There is no evidence of dilation of the renal calyces or ureters. Normal renal parenchyma is seen bilaterally. There is no evidence of a ureterocele or duplicated collecting system.  Amniotic fluid level is normal.  No other identifiable soft markers were seen.  These findings are most consistent with urinary tract dilation type A1. The remainder of fetal anatomy appears normal.  Fetal biometry shows the estimated fetal weight at the 58 percentile.  Amniotic fluid volume: Within normal limits. Placenta: Anterior. Cervix: Normal appearance by transabdominal scan with a cervical length of 4.2 cm. Adnexa: No abnormality visualized.  Review of Systems: A review of systems was performed and was negative except per HPI   Vitals and Physical Exam See intake sheet for vitals Sitting comfortably on the sonogram table Nonlabored breathing Normal rate and rhythm Abdomen is nontender  Genetic testing: low risk NIPS  Assessment - UTDA1 -Serial growth ultrasounds to continue to assess the fetal kidneys -Referral to pediatric urologist for prenatal consultation is not indicated until after delivery if findings are persistent  -If findings are still present at the next ultrasound we will add  her to the [facility] list. We can also discuss amnio at next visit if the findings are persistent.   I spent 45 minutes reviewing the patients chart, including labs and images as well as counseling the  patient about her medical conditions.  Valeda Malm  MFM, [place]   06/15/2022  3:49 PM

## 2022-06-20 ENCOUNTER — Other Ambulatory Visit: Payer: Self-pay | Admitting: *Deleted

## 2022-06-20 DIAGNOSIS — O09529 Supervision of elderly multigravida, unspecified trimester: Secondary | ICD-10-CM

## 2022-06-20 DIAGNOSIS — F1110 Opioid abuse, uncomplicated: Secondary | ICD-10-CM

## 2022-06-20 DIAGNOSIS — F1120 Opioid dependence, uncomplicated: Secondary | ICD-10-CM

## 2022-06-24 ENCOUNTER — Encounter: Payer: Self-pay | Admitting: Family Medicine

## 2022-06-24 DIAGNOSIS — O35EXX0 Maternal care for other (suspected) fetal abnormality and damage, fetal genitourinary anomalies, not applicable or unspecified: Secondary | ICD-10-CM | POA: Insufficient documentation

## 2022-06-30 ENCOUNTER — Ambulatory Visit (INDEPENDENT_AMBULATORY_CARE_PROVIDER_SITE_OTHER): Payer: Medicaid Other | Admitting: Family Medicine

## 2022-06-30 VITALS — BP 104/52 | HR 73 | Wt 159.0 lb

## 2022-06-30 DIAGNOSIS — O09899 Supervision of other high risk pregnancies, unspecified trimester: Secondary | ICD-10-CM

## 2022-06-30 DIAGNOSIS — F1111 Opioid abuse, in remission: Secondary | ICD-10-CM

## 2022-06-30 DIAGNOSIS — O09522 Supervision of elderly multigravida, second trimester: Secondary | ICD-10-CM

## 2022-06-30 DIAGNOSIS — O09892 Supervision of other high risk pregnancies, second trimester: Secondary | ICD-10-CM

## 2022-06-30 DIAGNOSIS — O35EXX0 Maternal care for other (suspected) fetal abnormality and damage, fetal genitourinary anomalies, not applicable or unspecified: Secondary | ICD-10-CM

## 2022-06-30 DIAGNOSIS — O09529 Supervision of elderly multigravida, unspecified trimester: Secondary | ICD-10-CM

## 2022-06-30 DIAGNOSIS — R87810 Cervical high risk human papillomavirus (HPV) DNA test positive: Secondary | ICD-10-CM

## 2022-06-30 DIAGNOSIS — Z3A21 21 weeks gestation of pregnancy: Secondary | ICD-10-CM

## 2022-06-30 DIAGNOSIS — Z2839 Other underimmunization status: Secondary | ICD-10-CM

## 2022-06-30 MED ORDER — BUPRENORPHINE HCL 2 MG SL SUBL: 2.0000 mg | SUBLINGUAL_TABLET | Freq: Every day | SUBLINGUAL | 0 refills | Status: DC

## 2022-06-30 NOTE — Progress Notes (Signed)
History:  Ms. REMMY Hunt is a 36 y.o. F7P1025 83w1dwho presents to clinic today for routine prenatal care. She is currently monitored for the following issues for this high-risk pregnancy and has Missed abortion; Pelvic pain in female; Supervision of other high risk pregnancies, unspecified trimester; AMA (advanced maternal age) multigravida 352+ Opioid use disorder, mild, in sustained remission, on maintenance therapy ([name]; Rubella non-immune status, antepartum; and Cervical high risk HPV (human papillomavirus) test positive on their problem list.    Low appetite without n/v. Tries to have at least a few bites of tolerable foods. Patient reports  occasional sharp lower abdominal pain, particularly on right side, improving from past.  Contractions: Not present. Vag. Bleeding: None.  Movement: Present, but feels less than previous pregnancies. Denies leaking of fluid.   The following portions of the patient's history were reviewed and updated as appropriate: allergies, current medications, family history, past medical history, social history, past surgical history and problem list.  Review of Systems:  Review of Systems  Constitutional: Negative.   Cardiovascular: Negative.   Gastrointestinal: Negative.   Genitourinary: Negative.   Skin: Negative.   Neurological: Negative.   Psychiatric/Behavioral: Negative.        Objective:  Physical Exam Wt 159 lb (72.1 kg)   LMP 01/17/2022 (Approximate) Comment: 3 postive home preg tests  BMI 28.17 kg/m   Physical Exam Constitutional:      General: She is not in acute distress. Cardiovascular:     Pulses: Normal pulses.     Heart sounds: Normal heart sounds.  Pulmonary:     Effort: Pulmonary effort is normal.     Breath sounds: Normal breath sounds.  Abdominal:     Tenderness: There is no abdominal tenderness. There is no guarding.  Skin:    General: Skin is warm.  Neurological:     General: No focal deficit present.      Mental Status: She is alert.  Psychiatric:        Mood and Affect: Mood normal.       Labs and Imaging No results found for this or any previous visit (from the past 24 hour(s)).  No results found.  Health Maintenance Due  Topic Date Due   COVID-19 Vaccine (1) Never done   DTaP/Tdap/Td (1 - Tdap) Never done   INFLUENZA VACCINE  02/15/2022    Labs, imaging and previous visits in Epic and Care Everywhere reviewed  ASigourney  [id number] 219w1d1. [redacted] weeks gestation of pregnancy Appropriate fetal heart rate and measurement  2. Supervision of other high risk pregnancies, unspecified trimester  Last MFM USKoreaFW 58%, with concerns for UTDA1 Follow up MFM 1/04  3. Antepartum multigravida of advanced maternal age ASA 8164m4. Opioid use disorder, mild, in sustained remission, on maintenance therapy (HCC) Stable.  Is contemplating weaning off during 3rd trimester Her previous subutex provider has left, is looking to establish with another provider at the practice. Will plan on prescribing suboxone bridge beginning of January while patient establishes with new provider.  5. Rubella non-immune status, antepartum MMR post delivery      Preterm labor symptoms and general obstetric precautions including but not limited to vaginal bleeding, contractions, leaking of fluid and fetal movement were reviewed in detail with the patient. Please refer to After Visit Summary for other counseling recommendations.   Approximately 20 minutes of total time was spent with this patient on 12/14  AslFleeta Emmeredical Student 06/30/2022 2:38 PM

## 2022-06-30 NOTE — Progress Notes (Signed)
PRENATAL VISIT NOTE  Subjective:  Kristy Hunt is a 36 y.o. [phone] at 57w1dbeing seen today for ongoing prenatal care.  She is currently monitored for the following issues for this high-risk pregnancy and has Missed abortion; Pelvic pain in female; Supervision of other high risk pregnancies, unspecified trimester; AMA (advanced maternal age) multigravida 335+ Opioid use disorder, mild, in sustained remission, on maintenance therapy (HB[place]; Rubella non-immune status, antepartum; Cervical high risk HPV (human papillomavirus) test positive; and Pyelectasis of fetus on prenatal ultrasound on their problem list.  Patient reports  decreased appetite - was worse in first trimester, still lingering some. Takes a few bites, but doesn't feel like eating much. Minimal weight gain . Her subutex provider is no longer in state. Doesn't feel like that practice will continue to be a good fit. Currently taking Subutex 861mand dividing the pill. Ending up with some fragments.  Contractions: Not present. Vag. Bleeding: None.  Movement: Present. Denies leaking of fluid.   The following portions of the patient's history were reviewed and updated as appropriate: allergies, current medications, past family history, past medical history, past social history, past surgical history and problem list.   Objective:   Vitals:   06/30/22 1435  BP: (!) 104/52  Pulse: 73  Weight: 159 lb (72.1 kg)    Fetal Status: Fetal Heart Rate (bpm): 146   Movement: Present     General:  Alert, oriented and cooperative. Patient is in no acute distress.  Skin: Skin is warm and dry. No rash noted.   Cardiovascular: Normal heart rate noted  Respiratory: Normal respiratory effort, no problems with respiration noted  Abdomen: Soft, gravid, appropriate for gestational age.  Pain/Pressure: Present     Pelvic: Cervical exam deferred        Extremities: Normal range of motion.  Edema: None  Mental Status: Normal mood and affect.  Normal behavior. Normal judgment and thought content.   Assessment and Plan:  Pregnancy: G5P3013 at 2143w1d [redacted] weeks gestation of pregnancy  2. Supervision of other high risk pregnancies, unspecified trimester FHT normal  3. Antepartum multigravida of advanced maternal age ASA 6m76m. Opioid use disorder, mild, in sustained remission, on maintenance therapy (HCC)[place].  Will need UDS at appts.  We discussed that there are 2mg 2mutex tabs(did not tolerate suboxone). Will switch to those to provide better consistent dosing, plus may be easier to divide if she continues with her plan to wean off subutex.  5. Cervical high risk HPV (human papillomavirus) test positive Rpt PAP in 1 year  6. Pyelectasis of fetus on prenatal ultrasound F/u growth US  7KoreaRubella non-immune status, antepartum MMR post delivery.  Preterm labor symptoms and general obstetric precautions including but not limited to vaginal bleeding, contractions, leaking of fluid and fetal movement were reviewed in detail with the patient. Please refer to After Visit Summary for other counseling recommendations.   No follow-ups on file.  Future Appointments  Date Time Provider DeparGardiner/2024  1:15 PM WMC-MFC NURSE WMC-MFC WMC  [facility]/2024  1:30 PM WMC-MFC US2 WMC-MFCUS WMC  [facility]/2024  1:30 PM StinsTruett MainlandCWH-WMHP None  08/17/2022  8:15 AM StinsTruett MainlandCWH-WMHP None  08/18/2022 12:30 PM WMC-MFC NURSE WMC-MFC WMC  [facility]/2024 12:45 PM WMC-MFC US4 WMC-MFCUS WMC  [facility]4/2024 11:15 AM StinsTruett MainlandCWH-WMHP None  09/15/2022  1:30 PM StinsNehemiah SettlebTanna SavoyCWH-WMHP None    JacobEdison Nasuti  Joyice Faster, DO

## 2022-07-18 NOTE — L&D Delivery Note (Addendum)
Delivery Note Kristy Hunt is a 37 y.o. [id number] at [redacted]w[redacted]d admitted for IOL for PPROM.   GBS Status: unknown   Maximum Maternal Temperature: 36.8C  Labor course: Initial SVE: 1/thick/-3. Augmentation with: Cytotec.She received an epidural for pain. She then progressed to complete.  ROM: 24h 80m with clear fluid  Birth: At 1201 a viable female was delivered via spontaneous vaginal delivery (Presentation: direct OA restituted to LOT;  ). Nuchal cord present: No.  Cord over shoulder was noted. Shoulders and body delivered in usual fashion. Infant placed directly on mom's abdomen for bonding/skin-to-skin, baby dried and stimulated. Cord clamped x 2 after 1 minute and cut by patient's daughter.  Cord blood collected.  The placenta separated spontaneously and delivered via gentle cord traction.  Pitocin infused rapidly IV per protocol.  Fundus firm with massage.  Placenta inspected and appears to be intact with a 3 VC.  Placenta/Cord with the following complications: marginal cord insertion .    Sponge and instrument count were correct x2.  Intrapartum complications:  None Anesthesia:  epidural Episiotomy: none Lacerations:  none EBL (mL): 39cc   Infant: APGAR (1 MIN):  pending APGAR (5 MINS):  pending APGAR (10 MINS):   pending Infant weight: pending  Mom to postpartum.  Baby to NICU. Placenta to Pathology for PPROM    Plans to Bottlefeed Contraception: undecided Circumcision: wants inpatient  Delivery was attended by Dr. Concepcion Living.  Jiayu "Darlyne Russian, M.D. PGY-2 Family Medicine Visiting Resident Faculty Practice 09/30/2022 12:14 PM    Fellow ATTESTATION  I was present and gloved for this delivery and agree with the above documentation in the resident's note except as below.  Concepcion Living, MD [facility] (Faculty Practice) 09/30/2022, 5:54 PM

## 2022-07-21 ENCOUNTER — Ambulatory Visit: Payer: Medicaid Other | Attending: Maternal & Fetal Medicine

## 2022-07-21 ENCOUNTER — Ambulatory Visit: Payer: Medicaid Other | Admitting: *Deleted

## 2022-07-21 VITALS — BP 115/60 | HR 66

## 2022-07-21 DIAGNOSIS — Z3A24 24 weeks gestation of pregnancy: Secondary | ICD-10-CM

## 2022-07-21 DIAGNOSIS — F1120 Opioid dependence, uncomplicated: Secondary | ICD-10-CM

## 2022-07-21 DIAGNOSIS — O283 Abnormal ultrasonic finding on antenatal screening of mother: Secondary | ICD-10-CM

## 2022-07-21 DIAGNOSIS — O09899 Supervision of other high risk pregnancies, unspecified trimester: Secondary | ICD-10-CM | POA: Diagnosis present

## 2022-07-21 DIAGNOSIS — O99322 Drug use complicating pregnancy, second trimester: Secondary | ICD-10-CM

## 2022-07-21 DIAGNOSIS — O09522 Supervision of elderly multigravida, second trimester: Secondary | ICD-10-CM | POA: Diagnosis not present

## 2022-07-21 DIAGNOSIS — O09529 Supervision of elderly multigravida, unspecified trimester: Secondary | ICD-10-CM

## 2022-07-21 DIAGNOSIS — O99320 Drug use complicating pregnancy, unspecified trimester: Secondary | ICD-10-CM | POA: Insufficient documentation

## 2022-07-21 DIAGNOSIS — F1110 Opioid abuse, uncomplicated: Secondary | ICD-10-CM | POA: Diagnosis present

## 2022-07-21 DIAGNOSIS — Z363 Encounter for antenatal screening for malformations: Secondary | ICD-10-CM | POA: Diagnosis not present

## 2022-07-27 ENCOUNTER — Ambulatory Visit (INDEPENDENT_AMBULATORY_CARE_PROVIDER_SITE_OTHER): Payer: Medicaid Other | Admitting: Family Medicine

## 2022-07-27 VITALS — BP 103/71 | HR 66

## 2022-07-27 DIAGNOSIS — Z2839 Other underimmunization status: Secondary | ICD-10-CM

## 2022-07-27 DIAGNOSIS — O09899 Supervision of other high risk pregnancies, unspecified trimester: Secondary | ICD-10-CM

## 2022-07-27 DIAGNOSIS — O09522 Supervision of elderly multigravida, second trimester: Secondary | ICD-10-CM

## 2022-07-27 DIAGNOSIS — O35EXX0 Maternal care for other (suspected) fetal abnormality and damage, fetal genitourinary anomalies, not applicable or unspecified: Secondary | ICD-10-CM

## 2022-07-27 DIAGNOSIS — F1111 Opioid abuse, in remission: Secondary | ICD-10-CM

## 2022-07-27 DIAGNOSIS — Z3A25 25 weeks gestation of pregnancy: Secondary | ICD-10-CM

## 2022-07-27 DIAGNOSIS — O09892 Supervision of other high risk pregnancies, second trimester: Secondary | ICD-10-CM

## 2022-07-27 DIAGNOSIS — O09529 Supervision of elderly multigravida, unspecified trimester: Secondary | ICD-10-CM

## 2022-07-27 NOTE — Progress Notes (Signed)
   PRENATAL VISIT NOTE  Subjective:  Kristy Hunt is a 37 y.o. [phone] at 80w0dbeing seen today for ongoing prenatal care.  She is currently monitored for the following issues for this low-risk pregnancy and has Missed abortion; Pelvic pain in female; Supervision of other high risk pregnancies, unspecified trimester; AMA (advanced maternal age) multigravida 370+ Opioid use disorder, mild, in sustained remission, on maintenance therapy (HC[place]; Rubella non-immune status, antepartum; Cervical high risk HPV (human papillomavirus) test positive; and Pyelectasis of fetus on prenatal ultrasound on their problem list.  Patient reports no complaints.  Contractions: Not present. Vag. Bleeding: None.  Movement: Present. Denies leaking of fluid.   The following portions of the patient's history were reviewed and updated as appropriate: allergies, current medications, past family history, past medical history, past social history, past surgical history and problem list.   Objective:   Vitals:   07/27/22 1339  BP: 103/71  Pulse: 66    Fetal Status: Fetal Heart Rate (bpm): 140 Fundal Height: 25 cm Movement: Present     General:  Alert, oriented and cooperative. Patient is in no acute distress.  Skin: Skin is warm and dry. No rash noted.   Cardiovascular: Normal heart rate noted  Respiratory: Normal respiratory effort, no problems with respiration noted  Abdomen: Soft, gravid, appropriate for gestational age.  Pain/Pressure: Absent     Pelvic: Cervical exam deferred        Extremities: Normal range of motion.  Edema: None  Mental Status: Normal mood and affect. Normal behavior. Normal judgment and thought content.   Assessment and Plan:  Pregnancy: [id number] 227w0d. Supervision of other high risk pregnancies, unspecified trimester FHT and FH normal  2. Antepartum multigravida of advanced maternal age ASA '81mg'$   3. Opioid use disorder, mild, in sustained remission, on maintenance therapy  (HCC) Stable on current dose. Still contemplating weaning off.  If stays on, will consult NICU  4. Rubella non-immune status, antepartum MMR post delivery  5. Pyelectasis of fetus on prenatal ultrasound stable  Preterm labor symptoms and general obstetric precautions including but not limited to vaginal bleeding, contractions, leaking of fluid and fetal movement were reviewed in detail with the patient. Please refer to After Visit Summary for other counseling recommendations.   No follow-ups on file.  Future Appointments  Date Time Provider DeSmethport1/31/2024  8:15 AM StTruett MainlandDO CWH-WMHP None  08/18/2022 12:30 PM WMC-MFC NURSE [facility]/07/2022 12:45 PM WMC-MFC US4 [facility]MC[facility]MFC[facility]/14/2024 11:15 AM StTruett MainlandDO CWH-WMHP None  09/15/2022  1:30 PM StTruett MainlandDO CWH-WMHP None  09/29/2022  1:30 PM StTruett MainlandDO CWH-WMHP None  10/12/2022 11:15 AM StTruett MainlandDO CWH-WMHP None    JaTruett MainlandDO

## 2022-08-08 ENCOUNTER — Telehealth: Payer: Self-pay

## 2022-08-08 NOTE — Telephone Encounter (Signed)
Patient called stating she is having some vaginal pain and pressure due to bulgy varicose veins in her vagina. Advsied pt to use a cold compress and to elevate her legs when she is sitting. Understanding was voiced. Noel Henandez l Ival Pacer, CMA

## 2022-08-09 ENCOUNTER — Ambulatory Visit (INDEPENDENT_AMBULATORY_CARE_PROVIDER_SITE_OTHER): Payer: Medicaid Other | Admitting: Advanced Practice Midwife

## 2022-08-09 VITALS — BP 106/63 | HR 70 | Wt 161.0 lb

## 2022-08-09 DIAGNOSIS — I8391 Asymptomatic varicose veins of right lower extremity: Secondary | ICD-10-CM

## 2022-08-09 DIAGNOSIS — Z3482 Encounter for supervision of other normal pregnancy, second trimester: Secondary | ICD-10-CM

## 2022-08-09 DIAGNOSIS — I863 Vulval varices: Secondary | ICD-10-CM | POA: Insufficient documentation

## 2022-08-09 DIAGNOSIS — Z3A26 26 weeks gestation of pregnancy: Secondary | ICD-10-CM

## 2022-08-09 NOTE — Progress Notes (Addendum)
   PRENATAL VISIT NOTE  Subjective:  Kristy Hunt is a 37 y.o. [phone] at 31w6dbeing seen today for ongoing prenatal care.  She is currently monitored for the following issues for this high-risk pregnancy and has Missed abortion; Pelvic pain in female; Supervision of other high risk pregnancies, unspecified trimester; AMA (advanced maternal age) multigravida 396+ Opioid use disorder, mild, in sustained remission, on maintenance therapy (HE[place]; Rubella non-immune status, antepartum; Cervical high risk HPV (human papillomavirus) test positive; Pyelectasis of fetus on prenatal ultrasound; Vulvar varicose veins; Fracture of patella, right, open; and Sacral fracture ([place]ayette[place] on their problem list.  Patient reports  pain and pressure from vulvar varicosity on right vulva and right posterior upper thigh  Tried some remedies suggested to her but did not improve .  Contractions: Irritability. Vag. Bleeding: None.  Movement: Present. Denies leaking of fluid.   The following portions of the patient's history were reviewed and updated as appropriate: allergies, current medications, past family history, past medical history, past social history, past surgical history and problem list.   Objective:   Vitals:   08/09/22 1111  BP: 106/63  Pulse: 70  Weight: 161 lb (73 kg)    Fetal Status:     Movement: Present    Fundal height 26cm General:  Alert, oriented and cooperative. Patient is in no acute distress.  Skin: Skin is warm and dry. No rash noted.   Cardiovascular: Normal heart rate noted  Respiratory: Normal respiratory effort, no problems with respiration noted  Abdomen: Soft, gravid, appropriate for gestational age.  Pain/Pressure: Present    Right vulvar varicose vein visible, slightly bulging No vaginal varicosities noted.  Right post thigh has a large varicosity.  Also c/o enlargement of right thigh area where lipoma was removed.  Slightly enlarged, no lipoma palpable   Pelvic: Cervical exam  performed in the presence of a chaperone        Extremities: Normal range of motion.  Edema: None  Mental Status: Normal mood and affect. Normal behavior. Normal judgment and thought content.   Assessment and Plan:  Pregnancy: G5P3013 at 256w6d. [redacted] weeks gestation of pregnancy   2. Vulvar varicose veins    Discussed full length support hose/tights    Cold packs on vulva prn     Support belt  3. Varicose veins of right thigh     Will consult Vein specialists    If they are willing to treat, I will send letter stating it is safe  Preterm labor symptoms and general obstetric precautions including but not limited to vaginal bleeding, contractions, leaking of fluid and fetal movement were reviewed in detail with the patient. Please refer to After Visit Summary for other counseling recommendations.   No follow-ups on file.  Future Appointments  Date Time Provider DeBay Shore1/31/2024  8:15 AM StTruett MainlandDO CWH-WMHP None  08/18/2022 12:30 PM WMC-MFC NURSE [facility]/07/2022 12:45 PM WMC-MFC US4 [facility]2/14/2024 11:15 AM StTruett MainlandDO CWH-WMHP None  09/15/2022  1:30 PM StTruett MainlandDO CWH-WMHP None  09/29/2022  1:30 PM StTruett MainlandDO CWH-WMHP None  10/12/2022 11:15 AM StNehemiah SettleJaTanna SavoyDO CWH-WMHP None    MaHansel FeinsteinCNM

## 2022-08-12 ENCOUNTER — Telehealth: Payer: Self-pay

## 2022-08-12 ENCOUNTER — Telehealth: Payer: Medicaid Other | Admitting: Emergency Medicine

## 2022-08-12 ENCOUNTER — Encounter: Payer: Self-pay | Admitting: Family Medicine

## 2022-08-12 DIAGNOSIS — R102 Pelvic and perineal pain: Secondary | ICD-10-CM

## 2022-08-12 NOTE — Progress Notes (Signed)
Because you're having pain, you will need to be seen in person so that samples can be collected.  This is especially important in pregnancy.  I feel your condition warrants further evaluation and I recommend that you be seen in a face to face visit.   NOTE: There will be NO CHARGE for this eVisit   If you are having a true medical emergency please call 911.      For an urgent face to face visit, [place] has eight urgent care centers for your convenience:   NEW!! [place] [facility] [place] at [place] Get Driving Directions [phone] [address] [place], [place] [facility] [place] at [place] Get Driving Directions [phone] [address] [place], [place] [address]   [place] [facility] [place] [facility]) Get Driving Directions [phone] [address], [place] [address]  [place] [facility] [place] ([place]) Get Driving Directions [phone] [address] [place],  [place]  [address]  [place] [facility] [place] [facility] - at [address] Get Driving Directions  [phone] [address] [place],  [place] [address]   [place] [facility]  Get Driving Directions [phone] [address] [place], [place], [place] [address]   [place] [facility] [place] Get Driving Directions  [phone] [address] [place], [place] [address]   [place] [facility] at [place] Get Driving Directions [phone] [address], [place], [place] [address]  Your MyChart E-visit questionnaire answers were reviewed by a board certified advanced clinical practitioner to complete your personal care plan based on your specific symptoms.  Thank you for using e-Visits.   Approximately 5 minutes was used in reviewing the patient's chart, questionnaire, prescribing  medications, and documentation.

## 2022-08-12 NOTE — Telephone Encounter (Signed)
Patient was seen as work in [facility] on Tuesday Jan 23rd for vulvar varicose veins. Patient is now complaining of vaginal itching- patient desires to have something called in for this. Will route to provider for input. Kathrene Alu RN

## 2022-08-17 ENCOUNTER — Other Ambulatory Visit (HOSPITAL_COMMUNITY)
Admission: RE | Admit: 2022-08-17 | Discharge: 2022-08-17 | Disposition: A | Discharge status: Home / Self Care | Payer: Medicaid Other | Source: Ambulatory Visit | Attending: Family Medicine | Admitting: Family Medicine

## 2022-08-17 ENCOUNTER — Ambulatory Visit: Payer: Medicaid Other | Admitting: Family Medicine

## 2022-08-17 ENCOUNTER — Encounter: Payer: Self-pay | Admitting: General Practice

## 2022-08-17 VITALS — BP 110/60 | HR 70 | Wt 162.0 lb

## 2022-08-17 DIAGNOSIS — Z3A28 28 weeks gestation of pregnancy: Secondary | ICD-10-CM

## 2022-08-17 DIAGNOSIS — O09522 Supervision of elderly multigravida, second trimester: Secondary | ICD-10-CM | POA: Diagnosis not present

## 2022-08-17 DIAGNOSIS — N939 Abnormal uterine and vaginal bleeding, unspecified: Secondary | ICD-10-CM | POA: Insufficient documentation

## 2022-08-17 DIAGNOSIS — O09892 Supervision of other high risk pregnancies, second trimester: Secondary | ICD-10-CM

## 2022-08-17 DIAGNOSIS — O09899 Supervision of other high risk pregnancies, unspecified trimester: Secondary | ICD-10-CM

## 2022-08-17 DIAGNOSIS — Z2839 Other underimmunization status: Secondary | ICD-10-CM

## 2022-08-17 DIAGNOSIS — O09529 Supervision of elderly multigravida, unspecified trimester: Secondary | ICD-10-CM

## 2022-08-17 DIAGNOSIS — O35EXX0 Maternal care for other (suspected) fetal abnormality and damage, fetal genitourinary anomalies, not applicable or unspecified: Secondary | ICD-10-CM

## 2022-08-17 DIAGNOSIS — I863 Vulval varices: Secondary | ICD-10-CM

## 2022-08-17 DIAGNOSIS — F1111 Opioid abuse, in remission: Secondary | ICD-10-CM

## 2022-08-17 LAB — CERVICOVAGINAL ANCILLARY ONLY
Bacterial Vaginitis (gardnerella): NEGATIVE
Candida Glabrata: NEGATIVE
Candida Vaginitis: POSITIVE — AB
Comment: NEGATIVE
Comment: NEGATIVE
Comment: NEGATIVE

## 2022-08-17 MED ORDER — FAMOTIDINE 20 MG PO TABS: 20.0000 mg | ORAL_TABLET | Freq: Two times a day (BID) | ORAL | 3 refills | Status: DC

## 2022-08-17 MED ORDER — BUPRENORPHINE HCL 2 MG SL SUBL: 2.0000 mg | SUBLINGUAL_TABLET | Freq: Every day | SUBLINGUAL | 0 refills | Status: DC

## 2022-08-17 NOTE — Progress Notes (Signed)
   PRENATAL VISIT NOTE  Subjective:  Kristy Hunt is a 37 y.o. [phone] at 64w0dbeing seen today for ongoing prenatal care.  She is currently monitored for the following issues for this high-risk pregnancy and has Missed abortion; Pelvic pain in female; Supervision of other high risk pregnancies, unspecified trimester; AMA (advanced maternal age) multigravida 337+ Opioid use disorder, mild, in sustained remission, on maintenance therapy ([place]; Rubella non-immune status, antepartum; Cervical high risk HPV (human papillomavirus) test positive; Pyelectasis of fetus on prenatal ultrasound; Vulvar varicose veins; Fracture of patella, right, open; and Sacral fracture ([place] on their problem list.  Patient reports  vaginal irritation. Has right vulvar varicosities and some vulvar burning/itching .  Contractions: Irritability. Vag. Bleeding: None.  Movement: Present. Denies leaking of fluid.   The following portions of the patient's history were reviewed and updated as appropriate: allergies, current medications, past family history, past medical history, past social history, past surgical history and problem list.   Objective:   Vitals:   08/17/22 0828  BP: 110/60  Pulse: 70  Weight: 162 lb (73.5 kg)    Fetal Status: Fetal Heart Rate (bpm): 131 Fundal Height: 28 cm Movement: Present  Presentation: Vertex  General:  Alert, oriented and cooperative. Patient is in no acute distress.  Skin: Skin is warm and dry. No rash noted.   Cardiovascular: Normal heart rate noted  Respiratory: Normal respiratory effort, no problems with respiration noted  Abdomen: Soft, gravid, appropriate for gestational age.  Pain/Pressure: Present     Pelvic: Pelvic exam done with chaperone       Mild vulvar varicosity on the right. Do not extend to the introitus.   Extremities: Normal range of motion.  Edema: None  Mental Status: Normal mood and affect. Normal behavior. Normal judgment and thought content.    Assessment and Plan:  Pregnancy: G5P3013 at [redacted]w[redacted]d. [redacted] weeks gestation of pregnancy  2. Supervision of other high risk pregnancies, unspecified trimester FHT and FH normal 28 week labs - CBC - Glucose Tolerance, 2 Hours w/1 Hour - HIV Antibody (routine testing w rflx) - RPR  3. Opioid use disorder, mild, in sustained remission, on maintenance therapy (HCC) Continues to be stable on '2mg'$  per day. - Amb referral to Neonatal Abstinence Syndrome (NAS) Team  4. Antepartum multigravida of advanced maternal age '81mg'$  ASA  5. Pyelectasis of fetus on prenatal ultrasound Weight stable. Has USKoreaomorrow  6. Vulvar varicose veins Abdominal support band to try to support the pregnancy and improve pelvic blood flow.  7. Rubella non-immune status, antepartum Mmr post delivery  Preterm labor symptoms and general obstetric precautions including but not limited to vaginal bleeding, contractions, leaking of fluid and fetal movement were reviewed in detail with the patient. Please refer to After Visit Summary for other counseling recommendations.   No follow-ups on file.  Future Appointments  Date Time Provider DeSpen[place]2/07/2022 12:30 PM [facility]Ch[facility]UR[facility]2/07/2022 12:45 PM WMC-MFC US4 WMC-MFCUS [facility]MS[facility]2/14/2024 11:15 AM StTruett MainlandDO CWH-WMHP None  09/15/2022  1:30 PM StTruett MainlandDO CWH-WMHP None  09/29/2022  1:30 PM StTruett MainlandDO CWH-WMHP None  10/12/2022 11:15 AM StTruett MainlandDO CWH-WMHP None    JaTruett MainlandDO

## 2022-08-17 NOTE — Addendum Note (Signed)
Addended by: Phill Myron on: 08/17/2022 11:34 AM   Modules accepted: Orders

## 2022-08-17 NOTE — Addendum Note (Signed)
Addended by: Wendelyn Breslow L on: 08/17/2022 10:38 AM   Modules accepted: Orders

## 2022-08-17 NOTE — Addendum Note (Signed)
Addended by: Wendelyn Breslow L on: 08/17/2022 10:03 AM   Modules accepted: Orders

## 2022-08-18 ENCOUNTER — Inpatient Hospital Stay (HOSPITAL_COMMUNITY)
Admission: AD | Admit: 2022-08-18 | Discharge: 2022-08-18 | Disposition: A | Discharge status: Home / Self Care | Payer: Medicaid Other | Attending: Obstetrics and Gynecology | Admitting: Obstetrics and Gynecology

## 2022-08-18 ENCOUNTER — Ambulatory Visit: Payer: Medicaid Other | Attending: Maternal & Fetal Medicine

## 2022-08-18 ENCOUNTER — Encounter: Payer: Self-pay | Admitting: *Deleted

## 2022-08-18 ENCOUNTER — Ambulatory Visit: Payer: Medicaid Other | Admitting: *Deleted

## 2022-08-18 ENCOUNTER — Encounter (HOSPITAL_COMMUNITY): Payer: Self-pay | Admitting: Obstetrics and Gynecology

## 2022-08-18 ENCOUNTER — Other Ambulatory Visit: Payer: Self-pay | Admitting: *Deleted

## 2022-08-18 VITALS — BP 123/68 | HR 76

## 2022-08-18 DIAGNOSIS — F1120 Opioid dependence, uncomplicated: Secondary | ICD-10-CM

## 2022-08-18 DIAGNOSIS — O09529 Supervision of elderly multigravida, unspecified trimester: Secondary | ICD-10-CM

## 2022-08-18 DIAGNOSIS — W19XXXA Unspecified fall, initial encounter: Secondary | ICD-10-CM | POA: Diagnosis not present

## 2022-08-18 DIAGNOSIS — F1110 Opioid abuse, uncomplicated: Secondary | ICD-10-CM | POA: Diagnosis present

## 2022-08-18 DIAGNOSIS — O09899 Supervision of other high risk pregnancies, unspecified trimester: Secondary | ICD-10-CM | POA: Diagnosis present

## 2022-08-18 DIAGNOSIS — Z043 Encounter for examination and observation following other accident: Secondary | ICD-10-CM | POA: Diagnosis present

## 2022-08-18 DIAGNOSIS — Z3A28 28 weeks gestation of pregnancy: Secondary | ICD-10-CM | POA: Diagnosis not present

## 2022-08-18 DIAGNOSIS — O09523 Supervision of elderly multigravida, third trimester: Secondary | ICD-10-CM

## 2022-08-18 DIAGNOSIS — Z3689 Encounter for other specified antenatal screening: Secondary | ICD-10-CM | POA: Diagnosis not present

## 2022-08-18 DIAGNOSIS — F1190 Opioid use, unspecified, uncomplicated: Secondary | ICD-10-CM | POA: Diagnosis not present

## 2022-08-18 DIAGNOSIS — O368130 Decreased fetal movements, third trimester, not applicable or unspecified: Secondary | ICD-10-CM | POA: Insufficient documentation

## 2022-08-18 DIAGNOSIS — O99320 Drug use complicating pregnancy, unspecified trimester: Secondary | ICD-10-CM | POA: Insufficient documentation

## 2022-08-18 LAB — GLUCOSE TOLERANCE, 1 HOUR: Glucose, 1Hr PP: 120 mg/dL (ref 70–199)

## 2022-08-18 MED ORDER — FLUCONAZOLE 150 MG PO TABS: 150.0000 mg | ORAL_TABLET | Freq: Once | ORAL | 0 refills | Status: AC

## 2022-08-18 NOTE — Progress Notes (Signed)
Patient states she fell down a few steps coming out of her home prior to her appt. States she fell on her arm, L side of her abdomen and L knee. She states she has been feeling the baby move well, but has not felt movement since she fell. Patient in U/S room for scheduled scan.

## 2022-08-18 NOTE — MAU Provider Note (Signed)
History     CSN: [id number]  Arrival date and time: 08/18/22 1417   Event Date/Time   First Provider Initiated Contact with Patient 08/18/22 1513      Chief Complaint  Patient presents with   Fall   Decreased Fetal Movement   HPI  Kristy Hunt is a 37 y.o. [id number] at 20w1dwho presents for evaluation after a fall. Patient reports at 1215 she was leaving the house to go to her MFM appointment and slipped on the stairs. She landed on her knees. She believes she hit the lower outer portion of her abdomen on the rail. She went to her appointment and had a normal ultrasound and was sent to MAU for monitoring. She denies any pain. She denies any vaginal bleeding, discharge, and leaking of fluid. Denies any constipation, diarrhea or any urinary complaints. Reports normal fetal movement since her arrival to MAU.   OB History     Gravida  5   Para  3   Term  3   Preterm      AB  1   Living  3      SAB  1   IAB      Ectopic      Multiple      Live Births  3           Past Medical History:  Diagnosis Date   Bigeminal pulse    Complication of anesthesia    Migraine    Opioid use disorder, mild, in sustained remission, on maintenance therapy (HCC)    Vaginal Pap smear, abnormal     Past Surgical History:  Procedure Laterality Date   CHOLECYSTECTOMY     DILATION AND EVACUATION N/A 03/09/2016   Procedure: DILATATION AND EVACUATION;  Surgeon: CLavonia Drafts MD;  [facility];  Service: Gynecology;  Laterality: N/A;   FOOT SURGERY     PATELLA FRACTURE SURGERY     TONSILLECTOMY      Family History  Problem Relation Age of Onset   Endometriosis Mother    High Cholesterol Mother    Diabetes Maternal Grandfather    Diabetes Paternal Grandmother     Social History   Tobacco Use   Smoking status: Never   Smokeless tobacco: Never  Vaping Use   Vaping Use: Never used  Substance Use Topics   Alcohol use: Not Currently   Drug use: Not  Currently    Comment: in recovery for opiate abuse    Allergies:  Allergies  Allergen Reactions   Dilaudid [Hydromorphone Hcl] Hives and Shortness Of Breath   Hydromorphone Hcl Shortness Of Breath and Hives   Morphine And Related Hives and Shortness Of Breath    Pt states she can tolerate Percocet   Other Shortness Of Breath    Other reaction(s): Hives Pt states she can tolerate Percocet   Epinephrine     Heart flutters     No medications prior to admission.    Review of Systems  Constitutional: Negative.  Negative for fatigue and fever.  HENT: Negative.    Respiratory: Negative.  Negative for shortness of breath.   Cardiovascular: Negative.  Negative for chest pain.  Gastrointestinal: Negative.  Negative for abdominal pain, constipation, diarrhea, nausea and vomiting.  Genitourinary: Negative.  Negative for dysuria, vaginal bleeding and vaginal discharge.  Neurological: Negative.  Negative for dizziness and headaches.   Physical Exam   Blood pressure 106/64, pulse 74, temperature 98.2 F (36.8 C), temperature source Oral, resp.  rate 17, height '5\' 3"'$  (1.6 m), last menstrual period 01/17/2022, SpO2 100 %, unknown if currently breastfeeding.  Patient Vitals for the past 24 hrs:  BP Temp Temp src Pulse Resp SpO2 Height  08/18/22 1627 106/64 -- -- 74 -- 100 % --  08/18/22 1506 108/67 98.2 F (36.8 C) Oral 71 17 99 % '5\' 3"'$  (1.6 m)    Physical Exam Vitals and nursing note reviewed.  Constitutional:      General: She is not in acute distress.    Appearance: She is well-developed.  HENT:     Head: Normocephalic.  Eyes:     Pupils: Pupils are equal, round, and reactive to light.  Cardiovascular:     Rate and Rhythm: Normal rate and regular rhythm.     Heart sounds: Normal heart sounds.  Pulmonary:     Effort: Pulmonary effort is normal. No respiratory distress.     Breath sounds: Normal breath sounds.  Abdominal:     General: Bowel sounds are normal. There is no  distension.     Palpations: Abdomen is soft.     Tenderness: There is no abdominal tenderness.  Skin:    General: Skin is warm and dry.     Comments: Abrasion to knee  Neurological:     Mental Status: She is alert and oriented to person, place, and time.  Psychiatric:        Mood and Affect: Mood normal.        Behavior: Behavior normal.        Thought Content: Thought content normal.        Judgment: Judgment normal.     Fetal Tracing:  Baseline: 120 Variability: moderate Accels: 15x15 Decels: none  Toco: none  MAU Course  Procedures     [place] MFM OB FOLLOW UP  Result Date: 08/18/2022 ----------------------------------------------------------------------  OBSTETRICS REPORT                       (Signed Final 08/18/2022 01:51 pm) ---------------------------------------------------------------------- Patient Info  ID #:       [id number]                          D.O.B.:  [birth year] (36 yrs)  Name:       Kristy Hunt              Visit Date: 08/18/2022 12:56 pm ---------------------------------------------------------------------- Performed By  Attending:        Valeda Malm DO       Referred By:      [facility] MAU/Triage  Performed By:     Rolm Bookbinder RDMS     [facility]                                                             [place] [facility] ---------------------------------------------------------------------- Orders  #  Description                           Code        Ordered By  1  [place] MFM OB FOLLOW UP                   01093.23    Valeda Malm ----------------------------------------------------------------------  #  Order #                     Accession #                Episode #  1  [id number]                   [id number]                 [id number] ----------------------------------------------------------------------  Indications  Subutex use in pregnancy                       O99.322  [redacted] weeks gestation of pregnancy                Z3A.52  Advanced maternal age multigravida 94+,        O61.522  second trimester (37)  Encounter for antenatal screening for          Z36.3  malformations  LR NIPS/Neg Horizon/Neg AFP  Pyelectasis of fetus on prenatal ultrasound    O28.3  (resolved) ---------------------------------------------------------------------- Fetal Evaluation  Num Of Fetuses:         1  Cardiac Activity:       Observed  Presentation:           Cephalic  Placenta:               Anterior  Amniotic Fluid  AFI FV:      Within normal limits  AFI Sum(cm)     %Tile       Largest Pocket(cm)  18.43           71          5.71  RUQ(cm)       RLQ(cm)       LUQ(cm)        LLQ(cm)  3.77          5.71          3.57           5.38 ---------------------------------------------------------------------- Biometry  BPD:      71.6  mm     G. Age:  28w 5d         58  %    CI:        73.96   %    70 - 86                                                          FL/HC:      20.1   %    18.8 - 20.6  HC:      264.4  mm     G. Age:  28w 5d         38  %    HC/AC:      1.15  1.05 - 1.21  AC:      230.7  mm     G. Age:  27w 3d         24  %    FL/BPD:     74.3   %    71 - 87  FL:       53.2  mm     G. Age:  28w 2d         38  %    FL/AC:      23.1   %    20 - 24  Est. FW:    1147  gm      2 lb 8 oz     29  % ---------------------------------------------------------------------- OB History  Gravidity:    5         Term:   3        Prem:   0        SAB:   1  TOP:          0       Ectopic:  0        Living: 3 ---------------------------------------------------------------------- Gestational Age  LMP:           30w 3d        Date:  01/17/22                   EDD:   10/24/22  U/S Today:     28w 2d                                        EDD:   11/08/22  Best:          28w 1d     Det. By:  Loman Chroman         EDD:   11/09/22                                       (03/15/22) ---------------------------------------------------------------------- Anatomy  Cranium:               Appears normal         Cord Vessels:           Appears normal (3                                                                        vessel cord)  Heart:                 Appears normal         Kidneys:                Appear normal                         (4CH, axis, and                         situs)  Stomach:  Appears normal, left   Bladder:                Appears normal                         sided  Abdomen:               Appears normal  Other:  All anatomy previously seen ---------------------------------------------------------------------- Comments  The patient is here for a follow-up ultrasound at 28w 1d for  Subutex use in pregnancy EDD: 11/09/2022 dated by [facility]  (03/15/22). She fell today after walking down the  stairs and had minor abdominal trauma. She denies vaginal  bleeding or contractions. Fetal movement has been ok but  not pronounced.  Sonographic findings  Single intrauterine pregnancy.  Fetal cardiac activity:  Observed and appears normal.  Presentation: Cephalic.  Interval fetal anatomy appears normal. UTD has resolved.  Fetal biometry shows the estimated fetal weight at the 29  percentile.  Amniotic fluid volume: Within normal limits. MVP: 5.71 cm.  Placenta: Anterior.  Fetal movement was seen.  Recommendations  - Sent to MAU for prolonged monitoring for abruption after  minor trauma  - Growth [place] in 4 weeks  - I also discussed tapering vs continuing Subutex during  pregnancy and as long as the patient is well supported she  could taper if she wants to reduce the risk of NAS. I  discussed the importance and risk of relapse if she were to  quit but ultimately this is her decision. ----------------------------------------------------------------------                 Valeda Malm, DO Electronically Signed Final Report   08/18/2022 01:51 pm  ----------------------------------------------------------------------    MDM Labs ordered and reviewed.   NST reassuring for gestational age Uterine irritability resolved after PO hydration. Offered cervical exam and patient declines  CNM reviewed with Dr. Rip Harbour- ok to monitor until 1615 so it is 4 hours s/p fall  Assessment and Plan   1. Fall, initial encounter   2. [redacted] weeks gestation of pregnancy   3. NST (non-stress test) reactive     -Discharge home in stable condition -Third trimester precautions discussed -Patient advised to follow-up with OB as scheduled for prenatal care -Patient may return to MAU as needed or if her condition were to change or worsen  Wende Mott, CNM 08/18/2022, 3:13 PM

## 2022-08-18 NOTE — Addendum Note (Signed)
Addended by: Truett Mainland on: 08/18/2022 04:40 PM   Modules accepted: Orders

## 2022-08-18 NOTE — MAU Note (Signed)
...  Kristy Hunt is a 37 y.o. at 91w1dhere in MAU reporting: FGolden Circleat 1215 today down her stairs. She reports she hit her left arm and the left lower side of her abdomen. Had an U/S today after her fall that was already scheduled. She reports while sitting in the lobby in MAU she began feeling occasional painless tightness in her abdomen. Denies VB or LOF. Endorses fetal movement after receiving her U/S earlier but reports it has not returned to normal. Clicker given.   Onset of complaint: 1215 today Pain score: Denies pain.   FHT: 130 initial external

## 2022-08-18 NOTE — Discharge Instructions (Signed)

## 2022-08-31 ENCOUNTER — Ambulatory Visit (INDEPENDENT_AMBULATORY_CARE_PROVIDER_SITE_OTHER): Payer: Medicaid Other | Admitting: Family Medicine

## 2022-08-31 VITALS — BP 113/62 | HR 75 | Wt 163.1 lb

## 2022-08-31 DIAGNOSIS — Z2839 Other underimmunization status: Secondary | ICD-10-CM

## 2022-08-31 DIAGNOSIS — O09899 Supervision of other high risk pregnancies, unspecified trimester: Secondary | ICD-10-CM

## 2022-08-31 DIAGNOSIS — O09529 Supervision of elderly multigravida, unspecified trimester: Secondary | ICD-10-CM

## 2022-08-31 DIAGNOSIS — F1111 Opioid abuse, in remission: Secondary | ICD-10-CM

## 2022-08-31 DIAGNOSIS — O09523 Supervision of elderly multigravida, third trimester: Secondary | ICD-10-CM

## 2022-08-31 DIAGNOSIS — O35EXX1 Maternal care for other (suspected) fetal abnormality and damage, fetal genitourinary anomalies, fetus 1: Secondary | ICD-10-CM

## 2022-08-31 DIAGNOSIS — O35EXX0 Maternal care for other (suspected) fetal abnormality and damage, fetal genitourinary anomalies, not applicable or unspecified: Secondary | ICD-10-CM

## 2022-08-31 DIAGNOSIS — Z3A30 30 weeks gestation of pregnancy: Secondary | ICD-10-CM

## 2022-08-31 DIAGNOSIS — R87810 Cervical high risk human papillomavirus (HPV) DNA test positive: Secondary | ICD-10-CM

## 2022-08-31 DIAGNOSIS — O09893 Supervision of other high risk pregnancies, third trimester: Secondary | ICD-10-CM

## 2022-08-31 NOTE — Progress Notes (Signed)
   PRENATAL VISIT NOTE  Subjective:  Kristy Hunt is a 37 y.o. [phone] at 70w0dbeing seen today for ongoing prenatal care.  She is currently monitored for the following issues for this high-risk pregnancy and has Missed abortion; Pelvic pain in female; Supervision of other high risk pregnancies, unspecified trimester; AMA (advanced maternal age) multigravida 349+ Opioid use disorder, mild, in sustained remission, on maintenance therapy ([place]; Rubella non-immune status, antepartum; Cervical high risk HPV (human papillomavirus) test positive; Pyelectasis of fetus on prenatal ultrasound; Vulvar varicose veins; Fracture of patella, right, open; and Sacral fracture ([place] on their problem list.  Patient reports  some pressure due to pelvic varicose veins. Had a fall a couple of weeks ago, was seen in MAU .  Contractions: Irritability. Vag. Bleeding: None.  Movement: Present. Denies leaking of fluid.   The following portions of the patient's history were reviewed and updated as appropriate: allergies, current medications, past family history, past medical history, past social history, past surgical history and problem list.   Objective:   Vitals:   08/31/22 1127  BP: 113/62  Pulse: 75  Weight: 163 lb 1.9 oz (74 kg)    Fetal Status: Fetal Heart Rate (bpm): 118   Movement: Present     General:  Alert, oriented and cooperative. Patient is in no acute distress.  Skin: Skin is warm and dry. No rash noted.   Cardiovascular: Normal heart rate noted  Respiratory: Normal respiratory effort, no problems with respiration noted  Abdomen: Soft, gravid, appropriate for gestational age.  Pain/Pressure: Present     Pelvic: Cervical exam deferred        Extremities: Normal range of motion.  Edema: None  Mental Status: Normal mood and affect. Normal behavior. Normal judgment and thought content.   Assessment and Plan:  Pregnancy: G5P3013 at 378w0d. [redacted] weeks gestation of pregnancy  2. Supervision of  other high risk pregnancies, unspecified trimester 1hr GTT normal. Didn't get the other tests due to difficulty with blood draw. - HIV antibody (with reflex) - RPR - CBC  3. Antepartum multigravida of advanced maternal age ASA '81mg'$   4. Opioid use disorder, mild, in sustained remission, on maintenance therapy (HCC) Stable on suboxone.  Referral to NICU already done.  5. Rubella non-immune status, antepartum MMR post delivery  6. Pyelectasis of fetus on prenatal ultrasound resolved  7. Cervical high risk HPV (human papillomavirus) test positive Repap in 1 year.   Preterm labor symptoms and general obstetric precautions including but not limited to vaginal bleeding, contractions, leaking of fluid and fetal movement were reviewed in detail with the patient. Please refer to After Visit Summary for other counseling recommendations.   No follow-ups on file.  Future Appointments  Date Time Provider [place]2/29/2024  1:30 PM StTruett MainlandDO [facility] None  09/15/2022  3:30 PM [facility] NURSE [facility]/29/2024  3:45 PM [facility] US5 [facility]3/14/2024  1:30 PM StTruett MainlandDO [facility] None  10/12/2022 11:15 AM StTruett MainlandDO [facility] None  10/13/2022  3:30 PM [facility] NURSE [facility]3/28/2024  3:45 PM WMC-MFC US5 WMC-MFCUS WMRabunDO

## 2022-09-02 ENCOUNTER — Telehealth: Payer: Self-pay | Admitting: Neonatology

## 2022-09-03 ENCOUNTER — Inpatient Hospital Stay (HOSPITAL_COMMUNITY)
Admission: AD | Admit: 2022-09-03 | Discharge: 2022-09-03 | Disposition: A | Discharge status: Home / Self Care | Payer: Medicaid Other | Attending: Family Medicine | Admitting: Family Medicine

## 2022-09-03 ENCOUNTER — Encounter (HOSPITAL_COMMUNITY): Payer: Self-pay | Admitting: Family Medicine

## 2022-09-03 DIAGNOSIS — O26893 Other specified pregnancy related conditions, third trimester: Secondary | ICD-10-CM | POA: Insufficient documentation

## 2022-09-03 DIAGNOSIS — Z3689 Encounter for other specified antenatal screening: Secondary | ICD-10-CM

## 2022-09-03 DIAGNOSIS — O368130 Decreased fetal movements, third trimester, not applicable or unspecified: Secondary | ICD-10-CM | POA: Diagnosis not present

## 2022-09-03 DIAGNOSIS — Z3A30 30 weeks gestation of pregnancy: Secondary | ICD-10-CM | POA: Diagnosis not present

## 2022-09-03 DIAGNOSIS — S3141XA Laceration without foreign body of vagina and vulva, initial encounter: Secondary | ICD-10-CM | POA: Diagnosis not present

## 2022-09-03 DIAGNOSIS — O4693 Antepartum hemorrhage, unspecified, third trimester: Secondary | ICD-10-CM | POA: Diagnosis not present

## 2022-09-03 DIAGNOSIS — O09523 Supervision of elderly multigravida, third trimester: Secondary | ICD-10-CM | POA: Insufficient documentation

## 2022-09-03 MED ORDER — BENZOCAINE-MENTHOL 20-0.5 % EX AERO
1.0000 | INHALATION_SPRAY | Freq: Once | CUTANEOUS | Status: DC
  Filled 2022-09-03: qty 56

## 2022-09-03 NOTE — MAU Provider Note (Signed)
History     CSN: [id number]  Arrival date and time: 09/03/22 [id number]   Event Date/Time   First Provider Initiated Contact with Patient 09/03/22 0110      Chief Complaint  Patient presents with   Vaginal Bleeding   Headache   Kristy Hunt is a 37 y.o. [phone] at 29w3dwho [facility]  She presents today for vaginal bleeding and DFM.  She states blood was on the floor and in the toilet.  She states she also passed a couple dime sized clots.  Patient states that she was having sex when the bleeding started and noted that it was "gushing out."  Patient states the blood was bright red and that although she did not wear a sanitary napkin, she placed towels in her pants that were saturated. Patient reports that fetal movement was decreased, but has returned to normal since arrival. She denies vaginal discharge or issues with urination. Patient also reports a HA and questions if it is from the crying prior to arrival.   OB History     Gravida  5   Para  3   Term  3   Preterm      AB  1   Living  3      SAB  1   IAB      Ectopic      Multiple      Live Births  3           Past Medical History:  Diagnosis Date   Bigeminal pulse    Complication of anesthesia    Migraine    Opioid use disorder, mild, in sustained remission, on maintenance therapy ([place]    Vaginal Pap smear, abnormal     Past Surgical History:  Procedure Laterality Date   CHOLECYSTECTOMY     DILATION AND EVACUATION N/A 03/09/2016   Procedure: DILATATION AND EVACUATION;  Surgeon: CLavonia Drafts MD;  [facility];  Service: Gynecology;  Laterality: N/A;   FOOT SURGERY     PATELLA FRACTURE SURGERY     TONSILLECTOMY      Family History  Problem Relation Age of Onset   Endometriosis Mother    High Cholesterol Mother    Diabetes Maternal Grandfather    Diabetes Paternal Grandmother     Social History   Tobacco Use   Smoking status: Never   Smokeless tobacco:  Never  Vaping Use   Vaping Use: Never used  Substance Use Topics   Alcohol use: Not Currently   Drug use: Not Currently    Comment: in recovery for opiate abuse    Allergies:  Allergies  Allergen Reactions   Dilaudid [Hydromorphone Hcl] Hives and Shortness Of Breath   Hydromorphone Hcl Shortness Of Breath and Hives   Morphine And Related Hives and Shortness Of Breath    Pt states she can tolerate Percocet   Other Shortness Of Breath    Other reaction(s): Hives Pt states she can tolerate Percocet   Epinephrine     Heart flutters     Medications Prior to Admission  Medication Sig Dispense Refill Last Dose   aspirin EC 81 MG tablet Take 1 tablet (81 mg total) by mouth daily. Take after 12 weeks for prevention of preeclampsia later in pregnancy 300 tablet 2 Past Month   buprenorphine (SUBUTEX) 2 MG SUBL SL tablet Place 1 tablet (2 mg total) under the tongue daily. 30 tablet 0 09/02/2022   famotidine (PEPCID) 20 MG tablet  Take 1 tablet (20 mg total) by mouth 2 (two) times daily. 60 tablet 3 09/02/2022   magnesium 30 MG tablet Take 30 mg by mouth 2 (two) times daily.   09/02/2022   Prenatal Vit-Fe Fumarate-FA (PRENATAL MULTIVITAMIN) TABS tablet Take 1 tablet by mouth daily at 12 noon.   09/02/2022   acetaminophen (TYLENOL) 325 MG tablet Take 1,000 mg by mouth every 6 (six) hours as needed.      amoxicillin (AMOXIL) 500 MG tablet Take 500 mg by mouth 2 (two) times daily. (Patient not taking: Reported on 08/31/2022)       Review of Systems  Genitourinary:  Positive for vaginal bleeding. Negative for difficulty urinating, dysuria and vaginal discharge.  Neurological:  Positive for headaches. Negative for dizziness and light-headedness.   Physical Exam   Blood pressure 116/69, pulse 85, temperature 98.1 F (36.7 C), temperature source Oral, resp. rate 17, height 5' 2"$  (1.575 m), weight 73.9 kg, last menstrual period 01/17/2022, unknown if currently breastfeeding.  Physical  Exam Constitutional:      Appearance: She is well-developed.  HENT:     Head: Normocephalic and atraumatic.  Cardiovascular:     Rate and Rhythm: Normal rate and regular rhythm.  Pulmonary:     Effort: Pulmonary effort is normal.  Abdominal:     Palpations: Abdomen is soft.  Genitourinary:      Comments: Visual/Speculum Exam: -Left labial laceration noted. ~ 1.5cm in length, hemostatic, approximates well. Several small superficial lacerations around clitoral and urethral area. Hemostatic.  Area cleaned with faux swab.  Pictures taken, with patient phone, for patient visualization.  -Vaginal Vault: Pink mucosa with good rugae. No blood or discharge noted.  -Cervix:Pink, no lesions, cysts, or polyps.  Appears closed. No active bleeding from os -Bimanual Exam:  Deferred Musculoskeletal:        General: Normal range of motion.     Cervical back: Normal range of motion.  Skin:    General: Skin is warm and dry.  Neurological:     Mental Status: She is alert.  Psychiatric:        Mood and Affect: Mood normal.        Behavior: Behavior normal.     Fetal Assessment 120 bpm, Mod Var, -Decels, +15x15 Accels Toco: None  MAU Course  No results found for this or any previous visit (from the past 24 hour(s)). No results found.  MDM PE Labs: None EFM Peri-Care Education Assessment and Plan  37 year old G5P3013  SIUP at 30.3 weeks Cat I FT Vaginal Laceration DFM  -POC reviewed. -Exam performed and findings discussed. -Reassured that bleeding not coming from vaginal vault, but from labial laceration. -Further informed that laceration is hemostatic and requires no intervention. -Discussed pelvic rest x 6 weeks or until area is healed as well as usage of Dermoplast and peri-bottle.  -Given peri-care materials. -Patient offered and declines medication for HA.  -Precautions reviewed. -Instructed to follow up as scheduled. -Encouraged to call primary office or return to MAU if  symptoms worsen or with the onset of new symptoms. -Discharged to home in stable condition.   Maryann Conners MSN, CNM 09/03/2022, 1:10 AM

## 2022-09-03 NOTE — MAU Note (Addendum)
.  Kristy Hunt is a 37 y.o. at 10w3dhere in MAU reporting: Pt states she was having intercourse about an hour ago 2340 and something didn't "feel right" so she got up to the bathroom and noticed bleeding. Pt wiped multiple times and still saw purple/red blood with small blood clots.  No LOF.  Pt states that on the way here she did not feel baby move but once she got into the room she could feel baby move.    Vitals:   09/03/22 0040  BP: 116/69  Pulse: 85  Resp: 17  Temp: 98.1 F (36.7 C)     Pain score: lower abdominal pain 6/10. Left side Headache 7/10   FHT:120 Lab orders placed from triage:

## 2022-09-15 ENCOUNTER — Encounter: Payer: Self-pay | Admitting: *Deleted

## 2022-09-15 ENCOUNTER — Ambulatory Visit: Payer: Medicaid Other | Attending: Maternal & Fetal Medicine

## 2022-09-15 ENCOUNTER — Ambulatory Visit: Payer: Medicaid Other | Admitting: *Deleted

## 2022-09-15 ENCOUNTER — Ambulatory Visit (INDEPENDENT_AMBULATORY_CARE_PROVIDER_SITE_OTHER): Payer: Medicaid Other | Admitting: Family Medicine

## 2022-09-15 VITALS — BP 114/65 | HR 79 | Wt 164.0 lb

## 2022-09-15 VITALS — BP 111/58 | HR 72

## 2022-09-15 DIAGNOSIS — F1120 Opioid dependence, uncomplicated: Secondary | ICD-10-CM | POA: Insufficient documentation

## 2022-09-15 DIAGNOSIS — F1111 Opioid abuse, in remission: Secondary | ICD-10-CM

## 2022-09-15 DIAGNOSIS — O09523 Supervision of elderly multigravida, third trimester: Secondary | ICD-10-CM | POA: Insufficient documentation

## 2022-09-15 DIAGNOSIS — O99320 Drug use complicating pregnancy, unspecified trimester: Secondary | ICD-10-CM | POA: Diagnosis present

## 2022-09-15 DIAGNOSIS — F1190 Opioid use, unspecified, uncomplicated: Secondary | ICD-10-CM | POA: Diagnosis not present

## 2022-09-15 DIAGNOSIS — O09899 Supervision of other high risk pregnancies, unspecified trimester: Secondary | ICD-10-CM | POA: Insufficient documentation

## 2022-09-15 DIAGNOSIS — O99323 Drug use complicating pregnancy, third trimester: Secondary | ICD-10-CM

## 2022-09-15 DIAGNOSIS — O09529 Supervision of elderly multigravida, unspecified trimester: Secondary | ICD-10-CM | POA: Diagnosis present

## 2022-09-15 DIAGNOSIS — Z3A32 32 weeks gestation of pregnancy: Secondary | ICD-10-CM

## 2022-09-15 DIAGNOSIS — O09893 Supervision of other high risk pregnancies, third trimester: Secondary | ICD-10-CM

## 2022-09-15 NOTE — Progress Notes (Signed)
   PRENATAL VISIT NOTE  Subjective:  Kristy Hunt is a 37 y.o. [phone] at 46w1dbeing seen today for ongoing prenatal care.  She is currently monitored for the following issues for this high-risk pregnancy and has Missed abortion; Pelvic pain in female; Supervision of other high risk pregnancies, unspecified trimester; AMA (advanced maternal age) multigravida 322+ Opioid use disorder, mild, in sustained remission, on maintenance therapy ([place]; Rubella non-immune status, antepartum; Cervical high risk HPV (human papillomavirus) test positive; Pyelectasis of fetus on prenatal ultrasound; Vulvar varicose veins; Fracture of patella, right, open; and Sacral fracture ([place] on their problem list.  Patient reports  having labial laceration during sex. Went to MAU because of bleeding. Stopped by the time she was there.    Contractions: Irritability. Vag. Bleeding: None.  Movement: Present. Denies leaking of fluid.   The following portions of the patient's history were reviewed and updated as appropriate: allergies, current medications, past family history, past medical history, past social history, past surgical history and problem list.   Objective:   Vitals:   09/15/22 1338  BP: 114/65  Pulse: 79  Weight: 164 lb (74.4 kg)    Fetal Status:     Movement: Present     General:  Alert, oriented and cooperative. Patient is in no acute distress.  Skin: Skin is warm and dry. No rash noted.   Cardiovascular: Normal heart rate noted  Respiratory: Normal respiratory effort, no problems with respiration noted  Abdomen: Soft, gravid, appropriate for gestational age.  Pain/Pressure: Present     Pelvic: Cervical exam deferred        Extremities: Normal range of motion.  Edema: None  Mental Status: Normal mood and affect. Normal behavior. Normal judgment and thought content.   Assessment and Plan:  Pregnancy: [id number] 333w1d. Supervision of other high risk pregnancies, unspecified trimester -  CBC - HIV antibody (with reflex) - RPR  2. Antepartum multigravida of advanced maternal age ASA 8181m3. [redacted] weeks gestation of pregnancy  4. Opioid use disorder, mild, in sustained remission, on maintenance therapy (HCC) Stable. US [place] growth today. Has NICU conversation  Preterm labor symptoms and general obstetric precautions including but not limited to vaginal bleeding, contractions, leaking of fluid and fetal movement were reviewed in detail with the patient. Please refer to After Visit Summary for other counseling recommendations.   No follow-ups on file.  Future Appointments  Date Time Provider DepArion/29/2024  3:30 PM [facility]/29/2024  3:45 PM [facility] US5 [facility]/14/2024  1:30 PM StiTruett MainlandO [facility] None  10/12/2022 11:15 AM StiTruett MainlandO [facility] None  10/13/2022  3:30 PM [facility]MFC NURSE [facility]/28/2024  3:45 PM WMC-MFC US5 WMC-MFCUS WMCClintonO

## 2022-09-16 LAB — CBC
Hematocrit: 36.7 % (ref 34.0–46.6)
Hemoglobin: 12.4 g/dL (ref 11.1–15.9)
MCH: 28.6 pg (ref 26.6–33.0)
MCHC: 33.8 g/dL (ref 31.5–35.7)
MCV: 85 fL (ref 79–97)
Platelets: 153 10*3/uL (ref 150–450)
RBC: 4.33 x10E6/uL (ref 3.77–5.28)
RDW: 12.2 % (ref 11.7–15.4)
WBC: 6.7 10*3/uL (ref 3.4–10.8)

## 2022-09-16 LAB — RPR: RPR Ser Ql: NONREACTIVE

## 2022-09-16 LAB — HIV ANTIBODY (ROUTINE TESTING W REFLEX): HIV Screen 4th Generation wRfx: NONREACTIVE

## 2022-09-22 ENCOUNTER — Inpatient Hospital Stay (HOSPITAL_COMMUNITY)
Admission: AD | Admit: 2022-09-22 | Discharge: 2022-09-22 | Disposition: A | Discharge status: Home / Self Care | Payer: Medicaid Other | Attending: Obstetrics and Gynecology | Admitting: Obstetrics and Gynecology

## 2022-09-22 ENCOUNTER — Telehealth: Payer: Self-pay

## 2022-09-22 ENCOUNTER — Encounter (HOSPITAL_COMMUNITY): Payer: Self-pay | Admitting: Obstetrics and Gynecology

## 2022-09-22 ENCOUNTER — Other Ambulatory Visit: Payer: Self-pay

## 2022-09-22 DIAGNOSIS — O99280 Endocrine, nutritional and metabolic diseases complicating pregnancy, unspecified trimester: Secondary | ICD-10-CM

## 2022-09-22 DIAGNOSIS — O09523 Supervision of elderly multigravida, third trimester: Secondary | ICD-10-CM | POA: Insufficient documentation

## 2022-09-22 DIAGNOSIS — Z3A33 33 weeks gestation of pregnancy: Secondary | ICD-10-CM | POA: Diagnosis not present

## 2022-09-22 DIAGNOSIS — N898 Other specified noninflammatory disorders of vagina: Secondary | ICD-10-CM

## 2022-09-22 DIAGNOSIS — O09893 Supervision of other high risk pregnancies, third trimester: Secondary | ICD-10-CM | POA: Insufficient documentation

## 2022-09-22 DIAGNOSIS — O09899 Supervision of other high risk pregnancies, unspecified trimester: Secondary | ICD-10-CM

## 2022-09-22 DIAGNOSIS — O09529 Supervision of elderly multigravida, unspecified trimester: Secondary | ICD-10-CM

## 2022-09-22 DIAGNOSIS — E860 Dehydration: Secondary | ICD-10-CM

## 2022-09-22 DIAGNOSIS — O26893 Other specified pregnancy related conditions, third trimester: Secondary | ICD-10-CM

## 2022-09-22 DIAGNOSIS — O99283 Endocrine, nutritional and metabolic diseases complicating pregnancy, third trimester: Secondary | ICD-10-CM

## 2022-09-22 DIAGNOSIS — R519 Headache, unspecified: Secondary | ICD-10-CM | POA: Diagnosis not present

## 2022-09-22 LAB — COMPREHENSIVE METABOLIC PANEL
ALT: 12 U/L (ref 0–44)
AST: 15 U/L (ref 15–41)
Albumin: 2.7 g/dL — ABNORMAL LOW (ref 3.5–5.0)
Alkaline Phosphatase: 75 U/L (ref 38–126)
Anion gap: 11 (ref 5–15)
BUN: 12 mg/dL (ref 6–20)
CO2: 19 mmol/L — ABNORMAL LOW (ref 22–32)
Calcium: 8.9 mg/dL (ref 8.9–10.3)
Chloride: 106 mmol/L (ref 98–111)
Creatinine, Ser: 0.69 mg/dL (ref 0.44–1.00)
GFR, Estimated: >60 mL/min (ref >60)
Glucose, Bld: 121 mg/dL — ABNORMAL HIGH (ref 70–99)
Potassium: 3.4 mmol/L — ABNORMAL LOW (ref 3.5–5.1)
Sodium: 136 mmol/L (ref 135–145)
Total Bilirubin: 0.6 mg/dL (ref 0.3–1.2)
Total Protein: 5.8 g/dL — ABNORMAL LOW (ref 6.5–8.1)

## 2022-09-22 LAB — WET PREP, GENITAL
Clue Cells Wet Prep HPF POC: NONE SEEN
Sperm: NONE SEEN
Trich, Wet Prep: NONE SEEN
WBC, Wet Prep HPF POC: <10 (ref <10)
Yeast Wet Prep HPF POC: NONE SEEN

## 2022-09-22 LAB — CBC WITH DIFFERENTIAL/PLATELET
Abs Immature Granulocytes: 0.02 10*3/uL (ref 0.00–0.07)
Basophils Absolute: 0 10*3/uL (ref 0.0–0.1)
Basophils Relative: 0 %
Eosinophils Absolute: 0 10*3/uL (ref 0.0–0.5)
Eosinophils Relative: 0 %
HCT: 34 % — ABNORMAL LOW (ref 36.0–46.0)
Hemoglobin: 11.5 g/dL — ABNORMAL LOW (ref 12.0–15.0)
Immature Granulocytes: 0 %
Lymphocytes Relative: 17 %
Lymphs Abs: 1.1 10*3/uL (ref 0.7–4.0)
MCH: 28.3 pg (ref 26.0–34.0)
MCHC: 33.8 g/dL (ref 30.0–36.0)
MCV: 83.7 fL (ref 80.0–100.0)
Monocytes Absolute: 0.4 10*3/uL (ref 0.1–1.0)
Monocytes Relative: 6 %
Neutro Abs: 4.9 10*3/uL (ref 1.7–7.7)
Neutrophils Relative %: 77 %
Platelets: 160 10*3/uL (ref 150–400)
RBC: 4.06 MIL/uL (ref 3.87–5.11)
RDW: 12.5 % (ref 11.5–15.5)
WBC: 6.4 10*3/uL (ref 4.0–10.5)
nRBC: 0 % (ref 0.0–0.2)

## 2022-09-22 LAB — URINALYSIS, ROUTINE W REFLEX MICROSCOPIC
Bilirubin Urine: NEGATIVE
Glucose, UA: NEGATIVE mg/dL
Hgb urine dipstick: NEGATIVE
Ketones, ur: 20 mg/dL — AB
Leukocytes,Ua: NEGATIVE
Nitrite: NEGATIVE
Protein, ur: NEGATIVE mg/dL
Specific Gravity, Urine: 1.013 (ref 1.005–1.030)
pH: 6 (ref 5.0–8.0)

## 2022-09-22 LAB — PROTEIN / CREATININE RATIO, URINE
Creatinine, Urine: 85 mg/dL
Protein Creatinine Ratio: 0.11 mg/mg{Cre} (ref 0.00–0.15)
Total Protein, Urine: 9 mg/dL

## 2022-09-22 MED ORDER — ACETAMINOPHEN-CAFFEINE 500-65 MG PO TABS
1.0000 | ORAL_TABLET | Freq: Once | ORAL | Status: AC
  Administered 2022-09-22: 1 via ORAL
  Filled 2022-09-22: qty 1

## 2022-09-22 MED ORDER — LACTATED RINGERS IV BOLUS
1000.0000 mL | Freq: Once | INTRAVENOUS | Status: AC
  Administered 2022-09-22: 1000 mL via INTRAVENOUS

## 2022-09-22 MED ORDER — DIPHENHYDRAMINE HCL 50 MG/ML IJ SOLN
25.0000 mg | Freq: Once | INTRAMUSCULAR | Status: AC
  Administered 2022-09-22: 25 mg via INTRAVENOUS
  Filled 2022-09-22: qty 1

## 2022-09-22 MED ORDER — METOCLOPRAMIDE HCL 10 MG PO TABS
10.0000 mg | ORAL_TABLET | Freq: Once | ORAL | Status: AC
  Administered 2022-09-22: 10 mg via ORAL
  Filled 2022-09-22: qty 1

## 2022-09-22 NOTE — MAU Note (Signed)
.  Kristy Hunt is a 37 y.o. at 59w1dhere in MAU reporting: she's had a headache for past 3 days.  Reports last took Tylenol @ 1400 this afternoon, no relief noted.  Also reports intermittent numbness in bilateral arms and hands.  States she has felt "off' and having issues with balance.  Denies VB or LOF.  Endorses +FM. LMP: NA Onset of complaint: 3 days Pain score: 8 Vitals:   09/22/22 1807  BP: 104/69  Pulse: 78  Resp: 18  Temp: 98.1 F (36.7 C)  SpO2: 100%     FHT:128 bpm Lab orders placed from triage:   UA

## 2022-09-22 NOTE — MAU Provider Note (Addendum)
History     CSN: [id number]  Arrival date and time: 09/22/22 1738   Event Date/Time   First Provider Initiated Contact with Patient 09/22/22 1843      Chief Complaint  Patient presents with   Headache   Kristy Hunt , a  37 y.o. [id number] at 11w1dpresents to MAU with complaints of an ongoing headache for the last 3 days. Patient describes as "throbbing" right above her left eye currently rating pain a 8/10. She denies pain worsened by light and or sound. She states she used to get migraines often and denies that this feels the same. She states she attempted PO Tylenol at 2pm today without relief. She denies visual changes and epigastric pain and swelling. She states she "probably doesn't drink as much water as she should." She reports "a couple bottles today." Diet recall includes 1 Taco and some crackers. Recall yesterday was a few bites of tacos at dinner, some crackers and a bagel.   She also reports some intermittent cramping that has been on-going for a week or so. She endorses "different" discharge but denies color or odor. She denies leaking of fluid and endorses positive fetal movement. She states she just feels "off." Last pregnancy was 10 years ago.         OB History     Gravida  5   Para  3   Term  3   Preterm      AB  1   Living  3      SAB  1   IAB      Ectopic      Multiple      Live Births  3          Past Medical History:  Diagnosis Date   Bigeminal pulse    Complication of anesthesia    Migraine    Opioid use disorder, mild, in sustained remission, on maintenance therapy (HCC)    Vaginal Pap smear, abnormal    Past Surgical History:  Procedure Laterality Date   CHOLECYSTECTOMY     DILATION AND EVACUATION N/A 03/09/2016   Procedure: DILATATION AND EVACUATION;  Surgeon: CLavonia Drafts MD;  [facility];  Service: Gynecology;  Laterality: N/A;   FOOT SURGERY     PATELLA FRACTURE SURGERY     TONSILLECTOMY     Family  History  Problem Relation Age of Onset   Endometriosis Mother    High Cholesterol Mother    High blood pressure Mother    Diabetes Maternal Grandfather    Diabetes Paternal Grandmother    Social History   Tobacco Use   Smoking status: Never   Smokeless tobacco: Never  Vaping Use   Vaping Use: Never used  Substance Use Topics   Alcohol use: Not Currently   Drug use: Not Currently    Comment: in recovery for opiate abuse   Allergies:  Allergies  Allergen Reactions   Dilaudid [Hydromorphone Hcl] Hives and Shortness Of Breath   Hydromorphone Hcl Shortness Of Breath and Hives   Morphine And Related Hives and Shortness Of Breath    Pt states she can tolerate Percocet   Other Shortness Of Breath    Other reaction(s): Hives Pt states she can tolerate Percocet   Epinephrine     Heart flutters    No medications prior to admission.   Review of Systems  Constitutional:  Negative for chills, fatigue and fever.  Eyes:  Negative for pain and visual disturbance.  Respiratory:  Negative for apnea, shortness of breath and wheezing.   Cardiovascular:  Negative for chest pain and palpitations.  Gastrointestinal:  Positive for abdominal pain. Negative for constipation, diarrhea, nausea and vomiting.  Genitourinary:  Positive for vaginal discharge. Negative for difficulty urinating, dysuria, pelvic pain, vaginal bleeding and vaginal pain.  Musculoskeletal:  Negative for back pain.  Neurological:  Positive for headaches. Negative for seizures and weakness.  Psychiatric/Behavioral:  Negative for suicidal ideas.    Physical Exam   Blood pressure 106/66, pulse 67, temperature 98.1 F (36.7 C), temperature source Oral, resp. rate 18, height '5\' 3"'$  (1.6 m), weight 160 lb (72.6 kg), last menstrual period 01/17/2022, SpO2 98 %, unknown if currently breastfeeding.  Physical Exam Vitals and nursing note reviewed.  Constitutional:      General: She is not in acute distress.    Appearance: Normal  appearance.  HENT:     Head: Normocephalic.  Cardiovascular:     Rate and Rhythm: Normal rate.  Pulmonary:     Effort: Pulmonary effort is normal.  Musculoskeletal:        General: Normal range of motion.     Cervical back: Normal range of motion.  Skin:    General: Skin is warm and dry.     Capillary Refill: Capillary refill takes 2 to 3 seconds.  Neurological:     Mental Status: She is alert and oriented to person, place, and time.     GCS: GCS eye subscore is 4. GCS verbal subscore is 5. GCS motor subscore is 6.     Cranial Nerves: No cranial nerve deficit.     Deep Tendon Reflexes: Reflexes normal.  Psychiatric:        Mood and Affect: Mood normal.    FHT: 125 bpm with moderate variability. Accels present no decels (appropriate for gestational age)  Toco: irregular contractions. ( Patient unaware)   MAU Course  Procedures Orders Placed This Encounter  Procedures   Wet prep, genital   Urinalysis, Routine w reflex microscopic -Urine, Clean Catch   CBC with Differential/Platelet   Comprehensive metabolic panel   Protein / creatinine ratio, urine   Insert peripheral IV   Discharge patient   MDM - Given patient lack of water and 20 of ketones present in Urine IV bolus ordered.  - Multiple sticks attempted. IV team called.  - @ 2050- IV placed. IV benadryl and Reglan provided.  - Wet prep normal  - GC pending  - No WBC indicative for infection.  - PreE labs normal. BPs normal in MAU.  - Contractions resolved with IV Fluids.  - @ 2145 headache a 4/10. PO Excedrin Tension ordered. Recommended to complete IV bolus. Patient agreeable to plan.   - Transfer of care to Candie Chroman, CNM at 10:56 PM Kristy Hunt) Rollene Rotunda, MSN, [place] for [place]  09/22/22 10:56 PM   Once fluids finished, headache relieved to 2/10 and patient ready for discharge with return precautions.  Assessment and Plan  Pregnancy headache in third trimester - Plan: Discharge  patient  Supervision of other high risk pregnancies, unspecified trimester  Antepartum multigravida of advanced maternal age  Dehydration during pregnancy - Plan: Discharge patient  Vaginal discharge during pregnancy in third trimester - Plan: Discharge patient  [redacted] weeks gestation of pregnancy - Plan: Discharge patient   Follow up as scheduled for ongoing prenatal care  Allergies as of 09/22/2022       Reactions   Dilaudid [hydromorphone Hcl] Hives,  Shortness Of Breath   Hydromorphone Hcl Shortness Of Breath, Hives   Morphine And Related Hives, Shortness Of Breath   Pt states she can tolerate Percocet   Other Shortness Of Breath   Other reaction(s): Hives Pt states she can tolerate Percocet   Epinephrine    Heart flutters         Medication List     TAKE these medications    acetaminophen 325 MG tablet Commonly known as: TYLENOL Take 1,000 mg by mouth every 6 (six) hours as needed.   aspirin EC 81 MG tablet Take 1 tablet (81 mg total) by mouth daily. Take after 12 weeks for prevention of preeclampsia later in pregnancy   buprenorphine 2 MG Subl SL tablet Commonly known as: SUBUTEX Place 1 tablet (2 mg total) under the tongue daily.   famotidine 20 MG tablet Commonly known as: PEPCID Take 1 tablet (20 mg total) by mouth 2 (two) times daily.   magnesium 30 MG tablet Take 30 mg by mouth 2 (two) times daily.   prenatal multivitamin Tabs tablet Take 1 tablet by mouth daily at 12 noon.        Gabriel Carina 09/22/2022, 10:56 PM

## 2022-09-22 NOTE — Telephone Encounter (Signed)
Patient called stating that she is has had a headache for approx three days now. Patient is 33.[redacted] weeks pregnant. Patient states that she has tried tylenol with no relief and yesterday she got numbness and tingling in her hands and arms. Patient also states that she has noticed some contractions the last few days. Patient reports good fetal movement.   Patient advised to go to MAU for evaluation now. Patient states understanding and agreeable to plan. Kathrene Alu RN

## 2022-09-23 LAB — GC/CHLAMYDIA PROBE AMP (~~LOC~~) NOT AT ARMC
Chlamydia: NEGATIVE
Comment: NEGATIVE
Comment: NORMAL
Neisseria Gonorrhea: NEGATIVE

## 2022-09-29 ENCOUNTER — Inpatient Hospital Stay (HOSPITAL_COMMUNITY)
Admission: RE | Admit: 2022-09-29 | Discharge: 2022-10-01 | DRG: 806 | Disposition: A | Discharge status: Home / Self Care | Payer: Medicaid Other | Attending: Obstetrics and Gynecology | Admitting: Obstetrics and Gynecology

## 2022-09-29 ENCOUNTER — Ambulatory Visit (INDEPENDENT_AMBULATORY_CARE_PROVIDER_SITE_OTHER): Payer: Medicaid Other | Admitting: Family Medicine

## 2022-09-29 VITALS — BP 124/76 | HR 87 | Wt 164.1 lb

## 2022-09-29 DIAGNOSIS — O43123 Velamentous insertion of umbilical cord, third trimester: Secondary | ICD-10-CM | POA: Diagnosis present

## 2022-09-29 DIAGNOSIS — Z2839 Other underimmunization status: Secondary | ICD-10-CM

## 2022-09-29 DIAGNOSIS — O35EXX0 Maternal care for other (suspected) fetal abnormality and damage, fetal genitourinary anomalies, not applicable or unspecified: Secondary | ICD-10-CM | POA: Diagnosis present

## 2022-09-29 DIAGNOSIS — O09523 Supervision of elderly multigravida, third trimester: Secondary | ICD-10-CM | POA: Diagnosis not present

## 2022-09-29 DIAGNOSIS — O09893 Supervision of other high risk pregnancies, third trimester: Secondary | ICD-10-CM

## 2022-09-29 DIAGNOSIS — F1111 Opioid abuse, in remission: Secondary | ICD-10-CM

## 2022-09-29 DIAGNOSIS — Z3A34 34 weeks gestation of pregnancy: Secondary | ICD-10-CM

## 2022-09-29 DIAGNOSIS — R87810 Cervical high risk human papillomavirus (HPV) DNA test positive: Secondary | ICD-10-CM

## 2022-09-29 DIAGNOSIS — O9852 Other viral diseases complicating childbirth: Secondary | ICD-10-CM | POA: Diagnosis present

## 2022-09-29 DIAGNOSIS — Z23 Encounter for immunization: Secondary | ICD-10-CM | POA: Diagnosis not present

## 2022-09-29 DIAGNOSIS — Z7982 Long term (current) use of aspirin: Secondary | ICD-10-CM

## 2022-09-29 DIAGNOSIS — O99324 Drug use complicating childbirth: Secondary | ICD-10-CM | POA: Diagnosis not present

## 2022-09-29 DIAGNOSIS — O09899 Supervision of other high risk pregnancies, unspecified trimester: Secondary | ICD-10-CM

## 2022-09-29 DIAGNOSIS — O42913 Preterm premature rupture of membranes, unspecified as to length of time between rupture and onset of labor, third trimester: Secondary | ICD-10-CM | POA: Diagnosis present

## 2022-09-29 DIAGNOSIS — O42919 Preterm premature rupture of membranes, unspecified as to length of time between rupture and onset of labor, unspecified trimester: Secondary | ICD-10-CM | POA: Diagnosis present

## 2022-09-29 DIAGNOSIS — O09529 Supervision of elderly multigravida, unspecified trimester: Secondary | ICD-10-CM

## 2022-09-29 DIAGNOSIS — O4423 Partial placenta previa NOS or without hemorrhage, third trimester: Secondary | ICD-10-CM | POA: Diagnosis not present

## 2022-09-29 DIAGNOSIS — O42013 Preterm premature rupture of membranes, onset of labor within 24 hours of rupture, third trimester: Secondary | ICD-10-CM | POA: Diagnosis not present

## 2022-09-29 LAB — CBC
HCT: 34.9 % — ABNORMAL LOW (ref 36.0–46.0)
Hemoglobin: 11.6 g/dL — ABNORMAL LOW (ref 12.0–15.0)
MCH: 28.2 pg (ref 26.0–34.0)
MCHC: 33.2 g/dL (ref 30.0–36.0)
MCV: 84.7 fL (ref 80.0–100.0)
Platelets: 165 10*3/uL (ref 150–400)
RBC: 4.12 MIL/uL (ref 3.87–5.11)
RDW: 12.5 % (ref 11.5–15.5)
WBC: 8.7 10*3/uL (ref 4.0–10.5)
nRBC: 0 % (ref 0.0–0.2)

## 2022-09-29 MED ORDER — FENTANYL CITRATE (PF) 100 MCG/2ML IJ SOLN
100.0000 ug | INTRAMUSCULAR | Status: DC | PRN
  Administered 2022-09-29 – 2022-09-30 (×7): 100 ug via INTRAVENOUS
  Filled 2022-09-29 (×7): qty 2

## 2022-09-29 MED ORDER — PENICILLIN G POT IN DEXTROSE 60000 UNIT/ML IV SOLN
3.0000 10*6.[IU] | INTRAVENOUS | Status: DC
  Administered 2022-09-30 (×3): 3 10*6.[IU] via INTRAVENOUS
  Filled 2022-09-29 (×5): qty 50

## 2022-09-29 MED ORDER — LACTATED RINGERS IV SOLN: INTRAVENOUS | Status: DC

## 2022-09-29 MED ORDER — OXYTOCIN-SODIUM CHLORIDE 30-0.9 UT/500ML-% IV SOLN
2.5000 [IU]/h | INTRAVENOUS | Status: DC
  Filled 2022-09-29: qty 500

## 2022-09-29 MED ORDER — ACETAMINOPHEN 325 MG PO TABS
650.0000 mg | ORAL_TABLET | ORAL | Status: DC | PRN
  Administered 2022-09-29: 650 mg via ORAL
  Filled 2022-09-29 (×2): qty 2

## 2022-09-29 MED ORDER — SOD CITRATE-CITRIC ACID 500-334 MG/5ML PO SOLN: 30.0000 mL | ORAL | Status: DC | PRN

## 2022-09-29 MED ORDER — OXYCODONE-ACETAMINOPHEN 5-325 MG PO TABS: 1.0000 | ORAL_TABLET | ORAL | Status: DC | PRN

## 2022-09-29 MED ORDER — LACTATED RINGERS IV SOLN
500.0000 mL | INTRAVENOUS | Status: DC | PRN
  Administered 2022-09-29: 1000 mL via INTRAVENOUS
  Administered 2022-09-30: 500 mL via INTRAVENOUS

## 2022-09-29 MED ORDER — OXYCODONE-ACETAMINOPHEN 5-325 MG PO TABS: 2.0000 | ORAL_TABLET | ORAL | Status: DC | PRN

## 2022-09-29 MED ORDER — OXYTOCIN BOLUS FROM INFUSION
333.0000 mL | Freq: Once | INTRAVENOUS | Status: AC
  Administered 2022-09-30: 600 mL/h via INTRAVENOUS

## 2022-09-29 MED ORDER — BUPRENORPHINE HCL 2 MG SL SUBL
2.0000 mg | SUBLINGUAL_TABLET | Freq: Every day | SUBLINGUAL | Status: DC
  Administered 2022-09-30 – 2022-10-01 (×2): 2 mg via SUBLINGUAL
  Filled 2022-09-29 (×2): qty 1

## 2022-09-29 MED ORDER — SODIUM CHLORIDE 0.9 % IV SOLN
5.0000 10*6.[IU] | Freq: Once | INTRAVENOUS | Status: AC
  Administered 2022-09-29: 5 10*6.[IU] via INTRAVENOUS
  Filled 2022-09-29: qty 5

## 2022-09-29 MED ORDER — MISOPROSTOL 50MCG HALF TABLET
50.0000 ug | ORAL_TABLET | Freq: Once | ORAL | Status: AC
  Administered 2022-09-29: 50 ug via ORAL
  Filled 2022-09-29: qty 1

## 2022-09-29 MED ORDER — ONDANSETRON HCL 4 MG/2ML IJ SOLN: 4.0000 mg | Freq: Four times a day (QID) | INTRAMUSCULAR | Status: DC | PRN

## 2022-09-29 MED ORDER — LIDOCAINE HCL (PF) 1 % IJ SOLN: 30.0000 mL | INTRAMUSCULAR | Status: DC | PRN

## 2022-09-29 NOTE — Progress Notes (Signed)
   PRENATAL VISIT NOTE  Subjective:  Kristy Hunt is a 37 y.o. [phone] at [redacted]w[redacted]d being seen today for ongoing prenatal care.  She is currently monitored for the following issues for this high-risk pregnancy and has Missed abortion; Pelvic pain in female; Supervision of other high risk pregnancies, unspecified trimester; AMA (advanced maternal age) multigravida 45+; Opioid use disorder, mild, in sustained remission, on maintenance therapy ([place]); Rubella non-immune status, antepartum; Cervical high risk HPV (human papillomavirus) test positive; Pyelectasis of fetus on prenatal ultrasound; Vulvar varicose veins; Fracture of patella, right, open; and Sacral fracture ([place]) on their problem list.  Patient reports  gush of fluid on her way in, then some constant trickling .  Contractions: Irritability. Vag. Bleeding: None.  Movement: Present. Denies leaking of fluid.   The following portions of the patient's history were reviewed and updated as appropriate: allergies, current medications, past family history, past medical history, past social history, past surgical history and problem list.   Objective:   Vitals:   09/29/22 1331  BP: 124/76  Pulse: 87  Weight: 164 lb 1.3 oz (74.4 kg)    Fetal Status: Fetal Heart Rate (bpm): 154   Movement: Present     General:  Alert, oriented and cooperative. Patient is in no acute distress.  Skin: Skin is warm and dry. No rash noted.   Cardiovascular: Normal heart rate noted  Respiratory: Normal respiratory effort, no problems with respiration noted  Abdomen: Soft, gravid, appropriate for gestational age.  Pain/Pressure: Absent     Pelvic: Cervical exam performed in the presence of a chaperone      visually 1-2cm. + pooling. + fern.   Extremities: Normal range of motion.  Edema: None  Mental Status: Normal mood and affect. Normal behavior. Normal judgment and thought content.   Assessment and Plan:  Pregnancy: G5P3013 at [redacted]w[redacted]d 1. [redacted] weeks gestation of  pregnancy  2. Supervision of other high risk pregnancies, unspecified trimester FHT and FH normal. To labor floor due to PPROM Cleared with NICU.  3. Opioid use disorder, mild, in sustained remission, on maintenance therapy ([place])  4. Pyelectasis of fetus on prenatal ultrasound  5. Cervical high risk HPV (human papillomavirus) test positive  6. Rubella non-immune status, antepartum    Preterm labor symptoms and general obstetric precautions including but not limited to vaginal bleeding, contractions, leaking of fluid and fetal movement were reviewed in detail with the patient. Please refer to After Visit Summary for other counseling recommendations.   No follow-ups on file.  Future Appointments  Date Time Provider [place]  10/12/2022 11:15 AM Truett Mainland, DO [facility] None  10/13/2022  3:30 PM [facility] NURSE [facility]  10/13/2022  3:45 PM [facility] US5 [facility]  10/20/2022  3:10 PM Truett Mainland, DO [facility] None  11/02/2022 10:15 AM Truett Mainland, DO [facility] None  11/09/2022 10:55 AM Nehemiah Settle Tanna Savoy, DO [facility] None    Truett Mainland, DO

## 2022-09-29 NOTE — H&P (Signed)
OBSTETRIC ADMISSION HISTORY AND PHYSICAL  Kristy Hunt is a 37 y.o. female [phone] with IUP at 57w1dby 5w [place] for PPROM. She was sent from her routine prenatal visit as she had LOF. She has FM, no VB,  and only rare CTX. She plans on breast feeding. She  is undecided birth control. [facility] [place]  Estimated Date of Delivery: 11/09/22  Sono:    '@[redacted]w[redacted]d'$ , CWD, anatomy scan found to have bilateral urinary tract dilation type A  '@[redacted]w[redacted]d'$ , anterior placenta, EFW 35%, AC 33%. Kidneys are normal  Prenatal History/Complications:  - OUD on suboxone - resolved bilateral urinary tract dilation (noted on 19wk scan, resolved by 28wk) - AMA - Rubella non immune  Past Medical History: Past Medical History:  Diagnosis Date   Bigeminal pulse    Complication of anesthesia    Migraine    Opioid use disorder, mild, in sustained remission, on maintenance therapy ([place]    Vaginal Pap smear, abnormal     Past Surgical History: Past Surgical History:  Procedure Laterality Date   CHOLECYSTECTOMY     DILATION AND EVACUATION N/A 03/09/2016   Procedure: DILATATION AND EVACUATION;  Surgeon: CLavonia Drafts MD;  [facility];  Service: Gynecology;  Laterality: N/A;   FOOT SURGERY     PATELLA FRACTURE SURGERY     TONSILLECTOMY      Obstetrical History: OB History  Gravida Para Term Preterm AB Living  '5 3 3   1 3  '$ SAB IAB Ectopic Multiple Live Births  1       3    # Outcome Date GA Lbr Len/2nd Weight Sex Delivery Anes PTL Lv  5 Current           4 SAB 2018          3 Term 2014     Vag-Spont   LIV  2 Term 2011     Vag-Spont   LIV  1 Term 2005     Vag-Spont   LIV     Social History Social History   Socioeconomic History   Marital status: Legally Separated    Spouse name: Not on file   Number of children: Not on file   Years of education: Not on file   Highest education level: Not on file  Occupational  History   Not on file  Tobacco Use   Smoking status: Never   Smokeless tobacco: Never  Vaping Use   Vaping Use: Never used  Substance and Sexual Activity   Alcohol use: Not Currently   Drug use: Not Currently    Comment: in recovery for opiate abuse   Sexual activity: Yes    Birth control/protection: None  Other Topics Concern   Not on file  Social History Narrative   Not on file   Social Determinants of Health   Financial Resource Strain: Not on file  Food Insecurity: No Food Insecurity (09/29/2022)   Hunger Vital Sign    Worried About Running Out of Food in the Last Year: Never true    Ran Out of Food in the Last Year: Never true  Transportation Needs: No Transportation Needs (09/29/2022)   PRAPARE - THydrologist(Medical): No    Lack of Transportation (Non-Medical): No  Physical Activity: Not on file  Stress: Not on file  Social Connections: Not on file    Family History:  Family History  Problem Relation Age of Onset   Endometriosis Mother    High Cholesterol Mother    High blood pressure Mother    Diabetes Maternal Grandfather    Diabetes Paternal Grandmother     Allergies: Allergies  Allergen Reactions   Dilaudid [Hydromorphone Hcl] Hives and Shortness Of Breath   Hydromorphone Hcl Shortness Of Breath and Hives   Morphine And Related Hives and Shortness Of Breath    Pt states she can tolerate Percocet   Other Shortness Of Breath    Other reaction(s): Hives Pt states she can tolerate Percocet   Epinephrine     Heart flutters     Medications Prior to Admission  Medication Sig Dispense Refill Last Dose   acetaminophen (TYLENOL) 325 MG tablet Take 1,000 mg by mouth every 6 (six) hours as needed.      aspirin EC 81 MG tablet Take 1 tablet (81 mg total) by mouth daily. Take after 12 weeks for prevention of preeclampsia later in pregnancy 300 tablet 2    buprenorphine (SUBUTEX) 2 MG SUBL SL tablet Place 1 tablet (2 mg total) under  the tongue daily. 30 tablet 0    famotidine (PEPCID) 20 MG tablet Take 1 tablet (20 mg total) by mouth 2 (two) times daily. 60 tablet 3    magnesium 30 MG tablet Take 30 mg by mouth 2 (two) times daily.      Prenatal Vit-Fe Fumarate-FA (PRENATAL MULTIVITAMIN) TABS tablet Take 1 tablet by mouth daily at 12 noon.        Review of Systems   All systems reviewed and negative except as stated in HPI  Blood pressure 128/80, pulse 90, temperature 97.9 F (36.6 C), temperature source Oral, resp. rate 18, height '5\' 3"'$  (1.6 m), weight 74.4 kg, last menstrual period 01/17/2022, unknown if currently breastfeeding. General appearance: alert Lungs: noneabored breathing Heart: regular rate and rhythm Fetal monitoring: baseline 125, mod variability, + accel, - decel Uterine activity: every 5-7 min Dilation: 1 Effacement (%): Thick Station: -3 Exam by:: Dr Janus Molder   Prenatal labs: ABO, Rh: A/Positive/-- (10/11 1039) Antibody: Negative (10/11 1039) Rubella: <0.90 (10/11 1039) RPR: Non Reactive (02/29 1406)  HBsAg: Negative (10/11 1039)  HIV: Non Reactive (02/29 1406)  GBS:   unknown 1 hr Glucola 120 Genetic screening  AFP and [place] neg Anatomy US mild UTD - resolved  Prenatal Transfer Tool  Maternal Diabetes: No Genetic Screening: Normal Maternal Ultrasounds/Referrals: Fetal Kidney Anomalies - now resolved Fetal Ultrasounds or other Referrals:  None Maternal Substance Abuse:  none Significant Maternal Medications:  Meds include: Other:  Subutex '2mg'$  daily Significant Maternal Lab Results:  None Number of Prenatal Visits:greater than 3 verified prenatal visits Other Comments:  None  No results found for this or any previous visit (from the past 24 hour(s)).  Patient Active Problem List   Diagnosis Date Noted   Preterm premature rupture of membranes 09/29/2022   Vulvar varicose veins 08/09/2022   Pyelectasis of fetus on prenatal ultrasound 06/24/2022   Cervical high risk HPV  (human papillomavirus) test positive 05/04/2022   Rubella non-immune status, antepartum 04/28/2022   Supervision of other high risk pregnancies, unspecified trimester 04/27/2022   AMA (advanced maternal age) multigravida 35+ 04/27/2022   Opioid use disorder, mild, in sustained remission, on maintenance therapy ([place]) 04/27/2022   Fracture of patella, right, open 06/13/2021   Sacral fracture ([place]) 06/13/2021   Pelvic pain in female 05/16/2016   Missed abortion     Assessment/Plan:  Kristy Hunt is a 37 y.o. [id number] at 70w1dhere for IOL for PPROM.   #PPROM - PCN for GBS ppx  #OUD - on subutex '2mg'$  daily - NOWS for infant  #Labor: Oral cytotec 538m. Assess for pitocin at next cervical exam.  #Pain: Desires epidural #FWB: Cat 1  #ID:  PCN #MOF: both #MOC: undecided #Circ:  Yes   Seen with Dr. SiNaaman Plummerutry-Lott.  Jiayu "KaDarlyne RussianM.D. PGY-2 Family Medicine Visiting Resident Faculty Practice 09/29/2022 7:40 PM   GME ATTESTATION:  I saw and evaluated the patient. I agree with the findings and the plan of care as documented in the resident's note. I have made changes to documentation as necessary.  SiGerlene FeeDO OB Fellow, [facility]/14/2024, 7:57 PM

## 2022-09-29 NOTE — Progress Notes (Signed)
Labor Progress Note Kristy Hunt is a 37 y.o. [phone] at [redacted]w[redacted]d presented for PPROM and admitted for IOL S: doing well, feeling pain with contractions now. H/ache is recurring.  O:  BP 99/62   Pulse 69   Temp 98 F (36.7 C) (Oral)   Resp 17   Ht 5\' 3"  (1.6 m)   Wt 74.4 kg   LMP 01/17/2022 (Approximate) Comment: 3 postive home preg tests  BMI 29.07 kg/m  EFM: 120bpm/moderate variability /+accels, no decels  CVE: Dilation: 1.5 Effacement (%): 60 Station: -2 Presentation: Vertex Exam by:: Dr. Trina Ao   A&P: 37 y.o. [id number] [redacted]w[redacted]d IOL due to PPROM #Labor: Induction starting. S/p cytotec X1, contracting too much for a second dose right now. We discussed a FB< but would rather avoid this. Plan to continue monitoring. Watch for contraction pattern and consider redosing cytotec if wanes. #Pain: IV fentanyl, epidural at request #FWB: Cat 1 #GBS  unk, preterm - PCN  H/ache: possibly tension type. Blood pressures normal. Improved mildly with tylenol. Repeat tylenol 1000mg , given IVF 1 litre bolus. She will try to get some sleep.   [name], MD 3:49 AM

## 2022-09-30 ENCOUNTER — Encounter (HOSPITAL_COMMUNITY): Payer: Self-pay | Admitting: Obstetrics & Gynecology

## 2022-09-30 ENCOUNTER — Inpatient Hospital Stay (HOSPITAL_COMMUNITY): Payer: Medicaid Other | Admitting: Anesthesiology

## 2022-09-30 DIAGNOSIS — O4423 Partial placenta previa NOS or without hemorrhage, third trimester: Secondary | ICD-10-CM

## 2022-09-30 DIAGNOSIS — O09523 Supervision of elderly multigravida, third trimester: Secondary | ICD-10-CM

## 2022-09-30 DIAGNOSIS — O42013 Preterm premature rupture of membranes, onset of labor within 24 hours of rupture, third trimester: Secondary | ICD-10-CM

## 2022-09-30 DIAGNOSIS — O99324 Drug use complicating childbirth: Secondary | ICD-10-CM

## 2022-09-30 DIAGNOSIS — Z3A34 34 weeks gestation of pregnancy: Secondary | ICD-10-CM

## 2022-09-30 LAB — TYPE AND SCREEN
ABO/RH(D): A POS
Antibody Screen: NEGATIVE

## 2022-09-30 LAB — RPR: RPR Ser Ql: NONREACTIVE

## 2022-09-30 MED ORDER — ACETAMINOPHEN 500 MG PO TABS
1000.0000 mg | ORAL_TABLET | Freq: Four times a day (QID) | ORAL | Status: DC | PRN
  Administered 2022-09-30 (×2): 1000 mg via ORAL
  Filled 2022-09-30 (×2): qty 2

## 2022-09-30 MED ORDER — FENTANYL-BUPIVACAINE-NACL 0.5-0.125-0.9 MG/250ML-% EP SOLN
12.0000 mL/h | EPIDURAL | Status: DC | PRN
  Administered 2022-09-30: 12 mL/h via EPIDURAL
  Filled 2022-09-30: qty 250

## 2022-09-30 MED ORDER — WITCH HAZEL-GLYCERIN EX PADS: 1.0000 | MEDICATED_PAD | CUTANEOUS | Status: DC | PRN

## 2022-09-30 MED ORDER — EPHEDRINE 5 MG/ML INJ: 10.0000 mg | INTRAVENOUS | Status: DC | PRN

## 2022-09-30 MED ORDER — CYCLOBENZAPRINE HCL 10 MG PO TABS
5.0000 mg | ORAL_TABLET | Freq: Three times a day (TID) | ORAL | Status: DC | PRN
  Administered 2022-09-30: 5 mg via ORAL
  Filled 2022-09-30: qty 1

## 2022-09-30 MED ORDER — IBUPROFEN 600 MG PO TABS
600.0000 mg | ORAL_TABLET | Freq: Four times a day (QID) | ORAL | Status: DC
  Administered 2022-09-30 – 2022-10-01 (×4): 600 mg via ORAL
  Filled 2022-09-30 (×4): qty 1

## 2022-09-30 MED ORDER — BENZOCAINE-MENTHOL 20-0.5 % EX AERO: 1.0000 | INHALATION_SPRAY | CUTANEOUS | Status: DC | PRN

## 2022-09-30 MED ORDER — ONDANSETRON HCL 4 MG/2ML IJ SOLN: 4.0000 mg | INTRAMUSCULAR | Status: DC | PRN

## 2022-09-30 MED ORDER — PHENYLEPHRINE 80 MCG/ML (10ML) SYRINGE FOR IV PUSH (FOR BLOOD PRESSURE SUPPORT): 80.0000 ug | PREFILLED_SYRINGE | INTRAVENOUS | Status: DC | PRN

## 2022-09-30 MED ORDER — MEASLES, MUMPS & RUBELLA VAC IJ SOLR
0.5000 mL | Freq: Once | INTRAMUSCULAR | Status: AC
  Administered 2022-10-01: 0.5 mL via SUBCUTANEOUS
  Filled 2022-09-30: qty 0.5

## 2022-09-30 MED ORDER — SENNOSIDES-DOCUSATE SODIUM 8.6-50 MG PO TABS
2.0000 | ORAL_TABLET | Freq: Every day | ORAL | Status: DC
  Administered 2022-10-01: 2 via ORAL
  Filled 2022-09-30: qty 2

## 2022-09-30 MED ORDER — LACTATED RINGERS IV SOLN
500.0000 mL | Freq: Once | INTRAVENOUS | Status: AC
  Administered 2022-09-30: 500 mL via INTRAVENOUS

## 2022-09-30 MED ORDER — LIDOCAINE HCL (PF) 1 % IJ SOLN
INTRAMUSCULAR | Status: DC | PRN
  Administered 2022-09-30: 4 mL via EPIDURAL
  Administered 2022-09-30: 5 mL via EPIDURAL

## 2022-09-30 MED ORDER — CYCLOBENZAPRINE HCL 10 MG PO TABS
5.0000 mg | ORAL_TABLET | Freq: Three times a day (TID) | ORAL | Status: DC | PRN
  Administered 2022-09-30: 5 mg via ORAL
  Filled 2022-09-30 (×2): qty 1

## 2022-09-30 MED ORDER — PRENATAL MULTIVITAMIN CH
1.0000 | ORAL_TABLET | Freq: Every day | ORAL | Status: DC
  Administered 2022-10-01: 1 via ORAL
  Filled 2022-09-30 (×2): qty 1

## 2022-09-30 MED ORDER — TETANUS-DIPHTH-ACELL PERTUSSIS 5-2.5-18.5 LF-MCG/0.5 IM SUSY
0.5000 mL | PREFILLED_SYRINGE | Freq: Once | INTRAMUSCULAR | Status: AC
  Administered 2022-10-01: 0.5 mL via INTRAMUSCULAR
  Filled 2022-09-30: qty 0.5

## 2022-09-30 MED ORDER — COCONUT OIL OIL
1.0000 | TOPICAL_OIL | Status: DC | PRN
  Administered 2022-09-30: 1 via TOPICAL

## 2022-09-30 MED ORDER — ONDANSETRON HCL 4 MG PO TABS: 4.0000 mg | ORAL_TABLET | ORAL | Status: DC | PRN

## 2022-09-30 MED ORDER — ZOLPIDEM TARTRATE 5 MG PO TABS: 5.0000 mg | ORAL_TABLET | Freq: Every evening | ORAL | Status: DC | PRN

## 2022-09-30 MED ORDER — MISOPROSTOL 25 MCG QUARTER TABLET
25.0000 ug | ORAL_TABLET | Freq: Once | ORAL | Status: AC
  Administered 2022-09-30: 25 ug via VAGINAL
  Filled 2022-09-30: qty 1

## 2022-09-30 MED ORDER — DIPHENHYDRAMINE HCL 50 MG/ML IJ SOLN: 12.5000 mg | INTRAMUSCULAR | Status: DC | PRN

## 2022-09-30 MED ORDER — SIMETHICONE 80 MG PO CHEW: 80.0000 mg | CHEWABLE_TABLET | ORAL | Status: DC | PRN

## 2022-09-30 MED ORDER — DIBUCAINE (PERIANAL) 1 % EX OINT: 1.0000 | TOPICAL_OINTMENT | CUTANEOUS | Status: DC | PRN

## 2022-09-30 MED ORDER — ACETAMINOPHEN 325 MG PO TABS
650.0000 mg | ORAL_TABLET | ORAL | Status: DC | PRN
  Administered 2022-10-01: 650 mg via ORAL
  Filled 2022-09-30: qty 2

## 2022-09-30 MED ORDER — DIPHENHYDRAMINE HCL 25 MG PO CAPS: 25.0000 mg | ORAL_CAPSULE | Freq: Four times a day (QID) | ORAL | Status: DC | PRN

## 2022-09-30 NOTE — Lactation Note (Signed)
This note was copied from a baby's chart.  NICU Lactation Consultation Note  Patient Name: Kristy Hunt [id number] Date: 09/30/2022 Age:38 hours  Reason for consult: Initial assessment; Late-preterm 34-36.6wks; Infant < 6lbs; Other (Comment) (AMA, OUD on suboxone)  Subjective Visited with family of 8 69/48 weeks old (adjusted) NICU female; Kristy Hunt is a P4 but not very experienced breastfeeding, her youngest child is 39 y.o. Her plan is to put baby to breast along with pumping and bottle feeding; baby currently on NPO. Reviewed pumping schedule, lactogenesis II and anticipatory guidelines.  Objective Infant data: Mother's Current Feeding Choice: Breast Milk and Donor Milk   Maternal data: [id number]  Vaginal, Spontaneous Significant Breast History:: moderate breast changes during the pregnancy  Current breast feeding challenges:: NICU admission  Previous breastfeeding challenges?: -- (Her oldest one is 51 and the youngest one is 75, she can't remember at this point)  Does the patient have breastfeeding experience prior to this delivery?: Yes How long did the patient breastfeed?: 2 weeks  Pumping frequency: Pump has been set up, but mom hasn't initiated pumping yet Flange Size: 24  Risk factor for low milk supply:: prematurity, AMA, infant separation  Has patient been taught Hand Expression?: Yes  Hand Expression Comments: colostrum noted  What county?: [place]  [place] Program: Yes [facility] Referral Sent?: Yes Pump: Advised to call insurance company (She has a DEBP on the way from insurance)  Assessment Infant: Feeding Status: NPO  Maternal: Breasts are soft, no S/S of edema  Intervention/Plan Interventions: Breast feeding basics reviewed; DEBP; Education; [employer] brochure; Coconut oil  Tools: Pump; Flanges; Coconut oil Pump Education: Setup, frequency, and cleaning; Milk Storage  Plan of care: Encouraged to start pumping every 3 hours; ideally 8 pumping  sessions/24 hours Breast massage, hand expression and coconut oil were also encouraged prior pumping She'll call for assistance once baby is ready to go to breast  Mom's sister and 47 y.o daughter present and supportive. All questions and concerns answered, family to contact [facility] services PRN.  Consult Status: NICU follow-up  NICU Follow-up type: New admission follow up; Maternal D/C visit   Lenise Herald 09/30/2022, 4:47 PM

## 2022-09-30 NOTE — Progress Notes (Signed)
Patient c/o increased vaginal pressure. Dr Gala Romney to bedside for SVE 4/100. Discussed pain management options and medical history. Patient request epidural .

## 2022-09-30 NOTE — Progress Notes (Signed)
Labor Progress Note Kristy Hunt is a 37 y.o. [phone] at [redacted]w[redacted]d presented for PPROM and admitted for IOL S: doing well. Pain with contractions.  O:  BP 118/69   Pulse 66   Temp 97.7 F (36.5 C) (Oral)   Resp 18   Ht 5\' 3"  (1.6 m)   Wt 74.4 kg   LMP 01/17/2022 (Approximate) Comment: 3 postive home preg tests  BMI 29.07 kg/m  EFM: 120bpm/moderate variability /+accels, no decels  CVE: Dilation: 2 Effacement (%): 60 Station: -3 Presentation: Vertex Exam by:: Martinique Turner, RN   A&P: 37 y.o. [phone] [redacted]w[redacted]d IOL due to PPROM #Labor: s/p cytotec X 2 doses, declines FB #Pain: IV fentanyl, epidural at request #FWB: Cat 1 #GBS  unk, preterm - PCN  H/ache: still ongoing despite tylenol. Rx for flexeril given. Encouraged rest after IV fentanyl.  Kristy Cornacchia Sherrilyn Rist, MD 5:35 AM

## 2022-09-30 NOTE — Progress Notes (Signed)
Dr Fransisco Beau to bedside for epidural placement. Consent signed and time out conducted. Vitals remain stable and patient tolerated the procedure. Patient repositioned to supine position with slight left hip tilt. External monitors x2 placed. Reinforced epidural education with patient and support team. All verbalized understanding. Fall band in place.

## 2022-09-30 NOTE — Progress Notes (Signed)
Kristy Hunt is a 37 y.o. [id number] at [redacted]w[redacted]d with PPROM and laboring  Subjective:   Objective: BP 118/72   Pulse 65   Temp 97.9 F (36.6 C) (Oral)   Resp 17   Ht 5\' 3"  (1.6 m)   Wt 74.4 kg   LMP 01/17/2022 (Approximate) Comment: 3 postive home preg tests  BMI 29.07 kg/m  No intake/output data recorded. No intake/output data recorded.  FHT:  FHR: 120 bpm, variability: moderate,  accelerations:  Present,  decelerations:  Absent UC:   regular, every 3 minutes SVE:   Dilation: 4 Effacement (%): 100 Station: -3 Exam by:: Dr Gala Romney  Labs: Lab Results  Component Value Date   WBC 8.7 09/29/2022   HGB 11.6 (L) 09/29/2022   HCT 34.9 (L) 09/29/2022   MCV 84.7 09/29/2022   PLT 165 09/29/2022    Assessment / Plan: Spontaneous labor, progressing normally  Labor: Progressing normally Preeclampsia:  no signs or symptoms of toxicity Fetal Wellbeing:  Category I Pain Control:   Fent x9 doses and interested in epidural I/D:  n/a Anticipated MOD:  NSVD  Silas Sacramento, MD 09/30/2022, 8:55 AM

## 2022-09-30 NOTE — Progress Notes (Signed)
Circumcision Consent  Discussed with mom at bedside about circumcision.   Circumcision is a surgery that removes the skin that covers the tip of the penis, called the "foreskin." Circumcision is usually done when a boy is between 32 and 24 days old, sometimes up to 32-68 weeks old.  The most common reasons boys are circumcised include for cultural/religious beliefs or for parental preference (potentially easier to clean, so baby looks like daddy, etc).  There may be some medical benefits for circumcision:   Circumcised boys seem to have slightly lower rates of: ? Urinary tract infections (per the American Academy of Pediatrics an uncircumcised boy has a 1/100 chance of developing a UTI in the first year of life, a circumcised boy at a 07/998 chance of developing a UTI in the first year of life- a 10% reduction) ? Penis cancer (typically rare- an uncircumcised female has a 1 in 100,000 chance of developing cancer of the penis) ? Sexually transmitted infection (in endemic areas, including HIV, HPV and Herpes- circumcision does NOT protect against gonorrhea, chlamydia, trachomatis, or syphilis) ? Phimosis: a condition where that makes retraction of the foreskin over the glans impossible (0.4 per 1000 boys per year or 0.6% of boys are affected by their 15th birthday)  Boys and men who are not circumcised can reduce these extra risks by: ? Cleaning their penis well ? Using condoms during sex  What are the risks of circumcision?  As with any surgical procedure, there are risks and complications. In circumcision, complications are rare and usually minor, the most common being: ? Bleeding- risk is reduced by holding each clamp for 30 seconds prior to a cut being made, and by holding pressure after the procedure is done ? Infection- the penis is cleaned prior to the procedure, and the procedure is done under sterile technique ? Damage to the urethra or amputation of the penis  How is circumcision done  in baby boys?  The baby will be placed on a special table and the legs restrained for their safety. Numbing medication is injected into the penis, and the skin is cleansed with betadine to decrease the risk of infection.   What to expect:  The penis will look red and raw for 5-7 days as it heals. We expect scabbing around where the cut was made, as well as clear-pink fluid and some swelling of the penis right after the procedure. If your baby's circumcision starts to bleed or develops pus, please contact your pediatrician immediately.  All questions were answered and mother consented.  Maxmilian Trostel "Darlyne Russian, M.D. PGY-2 Family Medicine Visiting Resident Faculty [facility] 09/30/2022 12:19 PM

## 2022-09-30 NOTE — Progress Notes (Signed)
NICU notified patient is complete and will contact them when we begin pushing.

## 2022-09-30 NOTE — Plan of Care (Signed)

## 2022-09-30 NOTE — Discharge Summary (Signed)
Postpartum Discharge Summary  Date of Service updated***     Patient Name: Kristy Hunt DOB: May 17, 1986 MRN: [id number]  Date of admission: 09/29/2022 Delivery date:09/30/2022  Delivering provider: Concepcion Living  Date of discharge: 09/30/2022  Admitting diagnosis: Preterm premature rupture of membranes [O42.919] Intrauterine pregnancy: [redacted]w[redacted]d     Secondary diagnosis:  Principal Problem:   Preterm premature rupture of membranes Active Problems:   Supervision of other high risk pregnancies, unspecified trimester   AMA (advanced maternal age) multigravida 35+   Opioid use disorder, mild, in sustained remission, on maintenance therapy ([place])   Rubella non-immune status, antepartum   Cervical high risk HPV (human papillomavirus) test positive   Pyelectasis of fetus on prenatal ultrasound  Additional problems: ***    Discharge diagnosis: Preterm Pregnancy Delivered                                              Post partum procedures:{Postpartum procedures:23558} Augmentation: Cytotec Complications: None  Hospital course: Onset of Labor With Vaginal Delivery      37 y.o. yo [id number] at [redacted]w[redacted]d was admitted in Latent Labor on 09/29/2022. Labor course was complicated by PPROM.   Membrane Rupture Time/Date: 12:00 PM ,09/29/2022   Delivery Method:Vaginal, Spontaneous  Episiotomy: None  Lacerations:  None  Patient had a postpartum course complicated by ***.  She is ambulating, tolerating a regular diet, passing flatus, and urinating well. Patient is discharged home in stable condition on 09/30/22.  Newborn Data: Birth date:09/30/2022  Birth time:12:01 PM  Gender:Female  Living status:Living  Apgars:8 ,  Weight:2070 g   Magnesium Sulfate received: No BMZ received: No Rhophylac:No MMR:{MMR:30440033} T-DaP:{Tdap:23962} Flu: No Transfusion:{Transfusion received:30440034}  Physical exam  Vitals:   09/30/22 1025 09/30/22 1031 09/30/22 1103 09/30/22 1220  BP:  104/66 116/85  107/73  Pulse:   73 72  Resp:   18 18  Temp:    98.5 F (36.9 C)  TempSrc:    Oral  SpO2: 100%     Weight:      Height:       General: {Exam; general:21111117} Lochia: {Desc; appropriate/inappropriate:30686::"appropriate"} Uterine Fundus: {Desc; firm/soft:30687} Incision: {Exam; incision:21111123} DVT Evaluation: {Exam; dvt:2111122} Labs: Lab Results  Component Value Date   WBC 8.7 09/29/2022   HGB 11.6 (L) 09/29/2022   HCT 34.9 (L) 09/29/2022   MCV 84.7 09/29/2022   PLT 165 09/29/2022      Latest Ref Rng & Units 09/22/2022    6:37 PM  CMP  Glucose 70 - 99 mg/dL 121   BUN 6 - 20 mg/dL 12   Creatinine 0.44 - 1.00 mg/dL 0.69   Sodium 135 - 145 mmol/L 136   Potassium 3.5 - 5.1 mmol/L 3.4   Chloride 98 - 111 mmol/L 106   CO2 22 - 32 mmol/L 19   Calcium 8.9 - 10.3 mg/dL 8.9   Total Protein 6.5 - 8.1 g/dL 5.8   Total Bilirubin 0.3 - 1.2 mg/dL 0.6   Alkaline Phos 38 - 126 U/L 75   AST 15 - 41 U/L 15   ALT 0 - 44 U/L 12    Edinburgh Score:     No data to display           After visit meds:  Allergies as of 09/30/2022       Reactions   Dilaudid [hydromorphone Hcl] Hives,  Shortness Of Breath   Hydromorphone Hcl Shortness Of Breath, Hives   Morphine And Related Hives, Shortness Of Breath   Pt states she can tolerate Percocet   Other Shortness Of Breath   Other reaction(s): Hives Pt states she can tolerate Percocet   Epinephrine    Heart flutters      Med Rec must be completed prior to using this [facility]***        Discharge home in stable condition Infant Feeding: {Baby feeding:23562} Infant Disposition:{CHL IP OB HOME WITH [id number] Discharge instruction: per After Visit Summary and Postpartum booklet. Activity: Advance as tolerated. Pelvic rest for 6 weeks.  Diet: {OB BY:630183 Future Appointments: Future Appointments  Date Time Provider [place]  10/12/2022 11:15 AM Truett Mainland, DO [facility] None  11/02/2022 10:15 AM  Nehemiah Settle, Tanna Savoy, DO [facility] None   Follow up Visit: Message sent  Please schedule this patient for a In person postpartum visit in 6 weeks with the following provider: Any provider. Additional Postpartum F/U: None   Low risk pregnancy complicated by:  None Delivery mode:  Vaginal, Spontaneous  Anticipated Birth Control:  Unsure   09/30/2022 Concepcion Living, MD

## 2022-09-30 NOTE — Anesthesia Preprocedure Evaluation (Signed)
Anesthesia Evaluation  Patient identified by MRN, date of birth, ID band Patient awake    Reviewed: Allergy & Precautions, NPO status , Patient's Chart, lab work & pertinent test results  History of Anesthesia Complications Negative for: history of anesthetic complications  Airway Mallampati: II   Neck ROM: Full    Dental   Pulmonary neg pulmonary ROS   Pulmonary exam normal        Cardiovascular negative cardio ROS Normal cardiovascular exam     Neuro/Psych  Headaches  negative psych ROS   GI/Hepatic negative GI ROS,,,(+)     substance abuse   On suboxone    Endo/Other  negative endocrine ROS    Renal/GU negative Renal ROS     Musculoskeletal negative musculoskeletal ROS (+)  narcotic dependent  Abdominal   Peds  Hematology  (+) Blood dyscrasia, anemia  Plt 165k    Anesthesia Other Findings   Reproductive/Obstetrics (+) Pregnancy                             Anesthesia Physical Anesthesia Plan  ASA: 2  Anesthesia Plan: Epidural   Post-op Pain Management:    Induction:   PONV Risk Score and Plan: 2 and Treatment may vary due to age or medical condition  Airway Management Planned: Natural Airway  Additional Equipment: None  Intra-op Plan:   Post-operative Plan:   Informed Consent: I have reviewed the patients History and Physical, chart, labs and discussed the procedure including the risks, benefits and alternatives for the proposed anesthesia with the patient or authorized representative who has indicated his/her understanding and acceptance.       Plan Discussed with: Anesthesiologist  Anesthesia Plan Comments: (Labs reviewed. Platelets acceptable, patient not taking any blood thinning medications. Per RN, FHR tracing reported to be stable enough for sitting procedure. Risks and benefits discussed with patient, including PDPH, backache, epidural hematoma, failed  epidural, blood pressure changes, allergic reaction, and nerve injury. Patient expressed understanding and wished to proceed.)       Anesthesia Quick Evaluation

## 2022-09-30 NOTE — Progress Notes (Signed)
Report given to receiving nurse. No questions at this time.

## 2022-09-30 NOTE — Progress Notes (Signed)
Patient c/o pain and requests epidural for pain management. Dr Fransisco Beau notified. Med history (opioid disuse disorder) and allergies discussed (epinephrine, morphine, hydromorphone) Platelet level given. Physician enroute for epidural placement. Patient sitting up.

## 2022-09-30 NOTE — Anesthesia Procedure Notes (Signed)
Epidural Patient location during procedure: OB Start time: 09/30/2022 9:15 AM End time: 09/30/2022 9:18 AM  Staffing Anesthesiologist: Audry Pili, MD Performed: anesthesiologist   Preanesthetic Checklist Completed: patient identified, IV checked, risks and benefits discussed, monitors and equipment checked, pre-op evaluation and timeout performed  Epidural Patient position: sitting Prep: DuraPrep Patient monitoring: continuous pulse ox and blood pressure Approach: midline Location: L3-L4 Injection technique: LOR saline  Needle:  Needle type: Tuohy  Needle gauge: 17 G Needle length: 9 cm Needle insertion depth: 5 cm Catheter size: 19 Gauge Catheter at skin depth: 10 cm Test dose: negative and Other (1% lidocaine)  Assessment Events: blood not aspirated and no cerebrospinal fluid  Additional Notes Patient identified. Risks including, but not limited to, bleeding, infection, nerve damage, paralysis, inadequate analgesia, blood pressure changes, nausea, vomiting, allergic reaction, postpartum back pain, itching, and headache were discussed. Patient expressed understanding and wished to proceed. Sterile prep and drape, including hand hygiene, mask, and sterile gloves were used. The patient was positioned and the spine was prepped. The skin was anesthetized with lidocaine. No paraesthesia or other complication noted. The patient did not experience any signs of intravascular injection such as tinnitus or metallic taste in mouth, nor signs of intrathecal spread such as rapid motor block. Please see nursing notes for vital signs. The patient tolerated the procedure well.   Renold Don, MDReason for block:procedure for pain

## 2022-10-01 ENCOUNTER — Encounter (HOSPITAL_COMMUNITY): Payer: Self-pay | Admitting: Obstetrics & Gynecology

## 2022-10-01 ENCOUNTER — Other Ambulatory Visit: Payer: Self-pay

## 2022-10-01 MED ORDER — ACETAMINOPHEN 500 MG PO TABS: 1000.0000 mg | ORAL_TABLET | Freq: Three times a day (TID) | ORAL | 0 refills | Status: DC | PRN

## 2022-10-01 MED ORDER — IBUPROFEN 600 MG PO TABS: 600.0000 mg | ORAL_TABLET | Freq: Four times a day (QID) | ORAL | 0 refills | Status: DC

## 2022-10-01 NOTE — Progress Notes (Addendum)
Patient received Tdap and MMR vaccines prior to discharge.  Declined flu vaccine.  Patient just completing EPDS this afternoon with score of 10 with question ten being 0. Patient wanting discharge soon.  Phoned Idamae Lusher, CSW with above information and she said that patient could be discharged and that she would let weekday CSW know to see this patient while baby is in NICU.  Patient given BP machine per Dr. Verlin Dike instructions.  Patient demonstrated ability to take an accurate BP and knows to call her OB if she has increased BPs.

## 2022-10-01 NOTE — Anesthesia Postprocedure Evaluation (Signed)
Anesthesia Post Note  Patient: Kristy Hunt  Procedure(s) Performed: AN AD HOC LABOR EPIDURAL     Patient location during evaluation: Mother Baby Anesthesia Type: Epidural Level of consciousness: awake and alert and oriented Pain management: satisfactory to patient Vital Signs Assessment: post-procedure vital signs reviewed and stable Respiratory status: respiratory function stable Cardiovascular status: stable Postop Assessment: no headache, no backache, epidural receding, patient able to bend at knees, no signs of nausea or vomiting, adequate PO intake and able to ambulate Anesthetic complications: no   No notable events documented.  Last Vitals:  Vitals:   10/01/22 0418 10/01/22 0754  BP: 106/67 122/77  Pulse: (!) 57 72  Resp: 18 18  Temp:  36.6 C  SpO2: 98% 100%    Last Pain:  Vitals:   10/01/22 0754  TempSrc: Oral  PainSc:    Pain Goal: Patients Stated Pain Goal: 3 (09/30/22 1744)                 Katherina Mires

## 2022-10-01 NOTE — Lactation Note (Signed)
This note was copied from a baby's chart.  NICU Lactation Consultation Note  Patient Name: Kristy Hunt [id number] Date: 10/01/2022 Age:38 hours  Reason for consult: Follow-up assessment; Late-preterm 34-36.6wks; NICU baby; Infant < 6lbs; Other (Comment) (UDS on Suboxone)   Subjective  LC in to visit with P4 Mom of LPTI in the NICU.  Mom states she is pumping and not getting any colostrum yet.  Encouraged breast massage and hand expression and consistent pumping to support her milk supply.  Offered to provided a hand's free pumping band, Mom has a visitor and declined politely.  Mom knows she can ask for one which would help her massage her breasts during pumping.  Mom was able to do STS with baby last night.  No questions at present.  Mom aware of lactation support available to her and encouraged her to ask for help prn.  Objective Infant data: Mother's Current Feeding Choice: Breast Milk and Donor Milk  Infant feeding assessment NPO    Maternal data: [id number]  Vaginal, Spontaneous Significant Breast History:: moderate breast changes during the pregnancy  Current breast feeding challenges:: NICU admission  Previous breastfeeding challenges?: -- (Her oldest one is 38 and the youngest one is 38, she can't remember at this point)  Does the patient have breastfeeding experience prior to this delivery?: Yes How long did the patient breastfeed?: 2 weeks  Pumping frequency: Encouraged pumping every 3 hrs Pumped volume: 0 mL Flange Size: 24  Risk factor for low milk supply:: prematurity, AMA, infant separation  Has patient been taught Hand Expression?: Yes  Hand Expression Comments: colostrum noted  What county?: [place]    [place] Program: Yes WIC Referral Sent?: Yes Pump: Advised to call insurance company (She has a DEBP on the way from insurance)  Assessment Infant: Feeding Status: NPO Maternal: Milk volume: Normal  Intervention/Plan Interventions: Breast  feeding basics reviewed; Skin to skin; Breast massage; Hand express; DEBP  Tools: Pump; Flanges; Coconut oil Pump Education: Setup, frequency, and cleaning  Plan: Consult Status: NICU follow-up  NICU Follow-up type: Verify onset of copious milk; Verify absence of engorgement   Broadus John 10/01/2022, 10:18 AM

## 2022-10-04 ENCOUNTER — Telehealth (HOSPITAL_COMMUNITY): Payer: Self-pay | Admitting: *Deleted

## 2022-10-04 DIAGNOSIS — Z1331 Encounter for screening for depression: Secondary | ICD-10-CM

## 2022-10-04 NOTE — Telephone Encounter (Signed)
Inpatient EPDS=10. Ambulatory [facility] referral made. Dr. Nehemiah Settle notified via chart.  Odis Hollingshead, RN 10-04-2022 at 11:29am

## 2022-10-05 ENCOUNTER — Ambulatory Visit (HOSPITAL_COMMUNITY): Payer: Self-pay

## 2022-10-05 LAB — SURGICAL PATHOLOGY

## 2022-10-05 NOTE — Lactation Note (Signed)
This note was copied from a baby's chart.  NICU Lactation Consultation Note  Patient Name: Kristy Hunt Date: 10/05/2022 Age:37 years  Reason for consult: Follow-up assessment; NICU baby; Late-preterm 34-36.6wks; Infant < 6lbs; Other (Comment) (AMA, OUD on suboxone)  Subjective Visited with family of 38 73/4 weeks old AGA NICU female for 5-day post-partum follow up. Kristy Hunt reports her milk is in and denies any S/S of engorgement at this time but noticed she hasn't been pumping consistently, she voiced she hasn't gotten her insurance pump in the mail yet. Eustace Quail from the [facility] office also called to try to get on hold with Kristy Hunt, provided a phone number to call Kristy Hunt back and get a pump from [facility]. This LC also offered a WIC loaner in case the [facility] office doesn't have an appt for her today, mom has been going back and forth between the hospital and home. Explained the importance of having a DEBP post-discharge to protect her supply; Kristy Hunt voiced she would get on hold with lactation in case she doesn't hear back from [facility] or the insurance company.   Objective Infant data: Mother's Current Feeding Choice: -- (NPO)  Infant feeding assessment Scale for Readiness: 3  Maternal data: [id number]  Vaginal, Spontaneous Pumping frequency: 3 times/24 hours Pumped volume: 30 mL (30-35 ml) Flange Size: 24  WIC Program: Yes WIC Referral Sent?: Yes What county?: [place] Pump: Advised to call insurance company (She has a DEBP on the way from insurance)  Assessment Infant: Feeding Status: NPO  Maternal: Milk volume: Low No S/S of engorgement at this time  Intervention/Plan Interventions: Interventions: Breast feeding basics reviewed; DEBP; Education Tools: Pump; Flanges Pump Education: Setup, frequency, and cleaning; Milk Storage  Plan: Encouraged to start pumping every 3 hours; ideally 8 pumping sessions/24 hours She'll call out for lactation in case  she decides to do the [facility] loaner pump through [place] She'll call for assistance once baby is ready to go to breast   No other support person at this time. All questions and concerns answered, family to contact [facility] services PRN.  Consult Status: NICU follow-up  NICU Follow-up type: Weekly NICU follow up   [employer] 10/05/2022, 11:45 AM

## 2022-10-06 ENCOUNTER — Observation Stay (HOSPITAL_COMMUNITY)
Admission: AD | Admit: 2022-10-06 | Discharge: 2022-10-07 | Disposition: A | Discharge status: Home / Self Care | Payer: Medicaid Other | Attending: Obstetrics & Gynecology | Admitting: Obstetrics & Gynecology

## 2022-10-06 ENCOUNTER — Encounter (HOSPITAL_COMMUNITY): Payer: Self-pay | Admitting: Family Medicine

## 2022-10-06 ENCOUNTER — Other Ambulatory Visit: Payer: Self-pay

## 2022-10-06 DIAGNOSIS — Z87891 Personal history of nicotine dependence: Secondary | ICD-10-CM | POA: Insufficient documentation

## 2022-10-06 DIAGNOSIS — O1415 Severe pre-eclampsia, complicating the puerperium: Principal | ICD-10-CM | POA: Diagnosis present

## 2022-10-06 DIAGNOSIS — O1410 Severe pre-eclampsia, unspecified trimester: Secondary | ICD-10-CM | POA: Diagnosis present

## 2022-10-06 LAB — CBC
HCT: 33.7 % — ABNORMAL LOW (ref 36.0–46.0)
Hemoglobin: 11 g/dL — ABNORMAL LOW (ref 12.0–15.0)
MCH: 28.4 pg (ref 26.0–34.0)
MCHC: 32.6 g/dL (ref 30.0–36.0)
MCV: 87.1 fL (ref 80.0–100.0)
Platelets: 144 10*3/uL — ABNORMAL LOW (ref 150–400)
RBC: 3.87 MIL/uL (ref 3.87–5.11)
RDW: 12.1 % (ref 11.5–15.5)
WBC: 4.6 10*3/uL (ref 4.0–10.5)
nRBC: 0 % (ref 0.0–0.2)

## 2022-10-06 LAB — COMPREHENSIVE METABOLIC PANEL
ALT: 17 U/L (ref 0–44)
AST: 20 U/L (ref 15–41)
Albumin: 2.6 g/dL — ABNORMAL LOW (ref 3.5–5.0)
Alkaline Phosphatase: 73 U/L (ref 38–126)
Anion gap: 11 (ref 5–15)
BUN: 11 mg/dL (ref 6–20)
CO2: 21 mmol/L — ABNORMAL LOW (ref 22–32)
Calcium: 8.6 mg/dL — ABNORMAL LOW (ref 8.9–10.3)
Chloride: 107 mmol/L (ref 98–111)
Creatinine, Ser: 0.65 mg/dL (ref 0.44–1.00)
GFR, Estimated: >60 mL/min (ref >60)
Glucose, Bld: 93 mg/dL (ref 70–99)
Potassium: 3.9 mmol/L (ref 3.5–5.1)
Sodium: 139 mmol/L (ref 135–145)
Total Bilirubin: 0.5 mg/dL (ref 0.3–1.2)
Total Protein: 5.5 g/dL — ABNORMAL LOW (ref 6.5–8.1)

## 2022-10-06 MED ORDER — LABETALOL HCL 5 MG/ML IV SOLN: 20.0000 mg | INTRAVENOUS | Status: DC | PRN

## 2022-10-06 MED ORDER — LACTATED RINGERS IV SOLN: INTRAVENOUS | Status: DC

## 2022-10-06 MED ORDER — FUROSEMIDE 20 MG PO TABS
20.0000 mg | ORAL_TABLET | Freq: Two times a day (BID) | ORAL | Status: DC
  Administered 2022-10-06 – 2022-10-07 (×3): 20 mg via ORAL
  Filled 2022-10-06 (×3): qty 1

## 2022-10-06 MED ORDER — HYDRALAZINE HCL 20 MG/ML IJ SOLN: 10.0000 mg | INTRAMUSCULAR | Status: DC | PRN

## 2022-10-06 MED ORDER — ACETAMINOPHEN-CAFFEINE 500-65 MG PO TABS
2.0000 | ORAL_TABLET | Freq: Once | ORAL | Status: AC
  Administered 2022-10-06: 2 via ORAL
  Filled 2022-10-06: qty 2

## 2022-10-06 MED ORDER — CYCLOBENZAPRINE HCL 5 MG PO TABS
5.0000 mg | ORAL_TABLET | Freq: Once | ORAL | Status: AC
  Administered 2022-10-06: 5 mg via ORAL
  Filled 2022-10-06: qty 1

## 2022-10-06 MED ORDER — MAGNESIUM SULFATE BOLUS VIA INFUSION
4.0000 g | Freq: Once | INTRAVENOUS | Status: AC
  Administered 2022-10-06: 4 g via INTRAVENOUS
  Filled 2022-10-06: qty 1000

## 2022-10-06 MED ORDER — PRENATAL MULTIVITAMIN CH
1.0000 | ORAL_TABLET | Freq: Every day | ORAL | Status: DC
  Administered 2022-10-06 – 2022-10-07 (×2): 1 via ORAL
  Filled 2022-10-06 (×2): qty 1

## 2022-10-06 MED ORDER — LABETALOL HCL 5 MG/ML IV SOLN: 40.0000 mg | INTRAVENOUS | Status: DC | PRN

## 2022-10-06 MED ORDER — IBUPROFEN 600 MG PO TABS
600.0000 mg | ORAL_TABLET | Freq: Four times a day (QID) | ORAL | Status: DC
  Administered 2022-10-06 – 2022-10-07 (×5): 600 mg via ORAL
  Filled 2022-10-06 (×5): qty 1

## 2022-10-06 MED ORDER — ACETAMINOPHEN-CAFFEINE 500-65 MG PO TABS
2.0000 | ORAL_TABLET | Freq: Four times a day (QID) | ORAL | Status: DC | PRN
  Administered 2022-10-06 – 2022-10-07 (×3): 2 via ORAL
  Filled 2022-10-06 (×5): qty 2

## 2022-10-06 MED ORDER — MAGNESIUM SULFATE 40 GM/1000ML IV SOLN
2.0000 g/h | INTRAVENOUS | Status: AC
  Administered 2022-10-06 – 2022-10-07 (×2): 2 g/h via INTRAVENOUS
  Filled 2022-10-06 (×2): qty 1000

## 2022-10-06 MED ORDER — BISACODYL 5 MG PO TBEC: 5.0000 mg | DELAYED_RELEASE_TABLET | Freq: Every day | ORAL | Status: DC | PRN

## 2022-10-06 MED ORDER — MAGNESIUM HYDROXIDE 400 MG/5ML PO SUSP: 30.0000 mL | Freq: Every day | ORAL | Status: DC | PRN

## 2022-10-06 MED ORDER — ALUM & MAG HYDROXIDE-SIMETH 200-200-20 MG/5ML PO SUSP: 30.0000 mL | ORAL | Status: DC | PRN

## 2022-10-06 MED ORDER — LABETALOL HCL 5 MG/ML IV SOLN: 80.0000 mg | INTRAVENOUS | Status: DC | PRN

## 2022-10-06 MED ORDER — BUPRENORPHINE HCL 2 MG SL SUBL
2.0000 mg | SUBLINGUAL_TABLET | Freq: Every day | SUBLINGUAL | Status: DC
  Administered 2022-10-07: 2 mg via SUBLINGUAL
  Filled 2022-10-06: qty 1

## 2022-10-06 MED ORDER — ZOLPIDEM TARTRATE 5 MG PO TABS: 5.0000 mg | ORAL_TABLET | Freq: Every evening | ORAL | Status: DC | PRN

## 2022-10-06 MED ORDER — CYCLOBENZAPRINE HCL 10 MG PO TABS
10.0000 mg | ORAL_TABLET | Freq: Three times a day (TID) | ORAL | Status: DC | PRN
  Administered 2022-10-06 – 2022-10-07 (×2): 10 mg via ORAL
  Filled 2022-10-06 (×2): qty 1

## 2022-10-06 MED ORDER — NIFEDIPINE ER OSMOTIC RELEASE 30 MG PO TB24
30.0000 mg | ORAL_TABLET | Freq: Every day | ORAL | Status: DC
  Administered 2022-10-06: 30 mg via ORAL
  Filled 2022-10-06: qty 1

## 2022-10-06 MED ORDER — ONDANSETRON HCL 4 MG PO TABS: 4.0000 mg | ORAL_TABLET | Freq: Four times a day (QID) | ORAL | Status: DC | PRN

## 2022-10-06 MED ORDER — DOCUSATE SODIUM 100 MG PO CAPS
100.0000 mg | ORAL_CAPSULE | Freq: Two times a day (BID) | ORAL | Status: DC
  Filled 2022-10-06 (×2): qty 1

## 2022-10-06 MED ORDER — ONDANSETRON HCL 4 MG/2ML IJ SOLN: 4.0000 mg | Freq: Four times a day (QID) | INTRAMUSCULAR | Status: DC | PRN

## 2022-10-06 MED ORDER — MAGNESIUM CITRATE PO SOLN: 1.0000 | Freq: Once | ORAL | Status: DC | PRN

## 2022-10-06 NOTE — MAU Note (Signed)
Called MB charge, asked to send manual breast pump.   Juice given to pt.

## 2022-10-06 NOTE — MAU Note (Signed)
Kristy Hunt is a 37 y.o. here in MAU reporting: vag delivery on the 15th.  No BP problems with the pregnancy. BP was a higher than usual when she checked at home this morning 154/93. Had a migraine the week before she delivered. Small HAs the last few days, Tylenol and Ibuprofen had been working. HA started during the night, nurse friend told her not to take Ibuprofen yet. Denies visual changes (though lights are bothering her). No epigastric pain or swelling. Neck has throbbing pain up in to head. Baby is in the NICU (born 72 wks early PPROM), milk has started coming in, is pumping. Onset of complaint: during the night. Pain score: HA 9, neck 9 Vitals:   10/06/22 0723  BP: (!) 133/91  Pulse: 62  Resp: 16  Temp: 98.1 F (36.7 C)  SpO2: 99%     Lab orders placed from triage:

## 2022-10-06 NOTE — Plan of Care (Signed)
  Problem: Education: Goal: Knowledge of disease or condition will improve Outcome: Progressing   

## 2022-10-06 NOTE — H&P (Signed)
OBSTETRIC ADMISSION HISTORY AND PHYSICAL  Kristy Hunt is a 37 y.o. female [id number] PPD #6 s/p SVD on 3/15 who presents to MAU for elevated BP, headache, and neck pain. She reports she has had a headache on and off since her labor course which is typically resolved with Tylenol and Ibuprofen, however she woke up this morning and headache felt different. Headache is all over and radiates into neck. She describes the headache as a constant, throbbing sensation. She currently rates headache 9/10. She also reports "feeling off-balance". No vision changes, RUQ/epigastric pain, or significant swelling. She reports she took her BP which was 153/94. She denies any history of elevated BPs. Baby is currently in the NICU and going well. She reports she is getting good sleep at night and that her diet is normal.   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 - PPROM at 34 weeks - AMA  Past Medical History: Past Medical History:  Diagnosis Date   Bigeminal pulse    Complication of anesthesia    Migraine    Opioid use disorder, mild, in sustained remission, on maintenance therapy ([place])    Vaginal Pap smear, abnormal     Past Surgical History: Past Surgical History:  Procedure Laterality Date   CHOLECYSTECTOMY     DILATION AND EVACUATION N/A 03/09/2016   Procedure: DILATATION AND EVACUATION;  Surgeon: Lavonia Drafts, MD;  Location: [facility];  Service: Gynecology;  Laterality: N/A;   FOOT SURGERY     PATELLA FRACTURE SURGERY     TONSILLECTOMY      Obstetrical History: OB History     Gravida  5   Para  4   Term  3   Preterm  1   AB  1   Living  4      SAB  1   IAB      Ectopic      Multiple  0   Live Births  4           Social History Social History   Socioeconomic History   Marital status: Legally Separated    Spouse name: Not on file   Number of children: Not on file   Years of education: Not on file    Highest education level: Not on file  Occupational History   Not on file  Tobacco Use   Smoking status: Former    Types: Cigarettes   Smokeless tobacco: Never   Tobacco comments:    Never consistent  Vaping Use   Vaping Use: Never used  Substance and Sexual Activity   Alcohol use: Not Currently   Drug use: Not Currently    Comment: in recovery for opiate abuse   Sexual activity: Not Currently    Birth control/protection: None  Other Topics Concern   Not on file  Social History Narrative   Not on file   Social Determinants of Health   Financial Resource Strain: Not on file  Food Insecurity: No Food Insecurity (09/29/2022)   Hunger Vital Sign    Worried About Running Out of Food in the Last Year: Never true    Ran Out of Food in the Last Year: Never true  Transportation Needs: No Transportation Needs (09/29/2022)   PRAPARE - Hydrologist (Medical): No    Lack of Transportation (Non-Medical): No  Physical Activity: Not on file  Stress: Not on file  Social Connections: Not on  file    Family History: Family History  Problem Relation Age of Onset   Endometriosis Mother    High Cholesterol Mother    High blood pressure Mother    Healthy Father    Diabetes Maternal Grandfather    Diabetes Paternal Grandmother     Allergies: Allergies  Allergen Reactions   Dilaudid [Hydromorphone Hcl] Hives and Shortness Of Breath   Hydromorphone Hcl Shortness Of Breath and Hives   Morphine And Related Hives and Shortness Of Breath    Pt states she can tolerate Percocet   Other Shortness Of Breath    Other reaction(s): Hives Pt states she can tolerate Percocet   Epinephrine     Heart flutters     Medications Prior to Admission  Medication Sig Dispense Refill Last Dose   acetaminophen (TYLENOL) 500 MG tablet Take 2 tablets (1,000 mg total) by mouth every 8 (eight) hours as needed. 60 tablet 0 10/05/2022 at 1200   buprenorphine (SUBUTEX) 2 MG SUBL SL  tablet Place 1 tablet (2 mg total) under the tongue daily. 30 tablet 0 10/06/2022 at 0630   famotidine (PEPCID) 20 MG tablet Take 1 tablet (20 mg total) by mouth 2 (two) times daily. 60 tablet 3 Past Week   ibuprofen (ADVIL) 600 MG tablet Take 1 tablet (600 mg total) by mouth every 6 (six) hours. 30 tablet 0 10/05/2022 at 1800   magnesium 30 MG tablet Take 30 mg by mouth 2 (two) times daily.   Past Week   Prenatal Vit-Fe Fumarate-FA (PRENATAL MULTIVITAMIN) TABS tablet Take 1 tablet by mouth daily at 12 noon.   10/05/2022   Review of Systems   All systems reviewed and negative except as stated in HPI Patient Vitals for the past 24 hrs:  BP Temp Temp src Pulse Resp SpO2 Height Weight  10/06/22 0912 139/84 -- -- (!) 56 -- -- -- --  10/06/22 0816 (!) 152/96 -- -- 63 -- -- -- --  10/06/22 0800 (!) 152/89 -- -- (!) 57 -- -- -- --  10/06/22 0745 (!) 140/86 -- -- (!) 59 -- -- -- --  10/06/22 0738 133/82 -- -- 65 -- -- -- --  10/06/22 0723 (!) 133/91 98.1 F (36.7 C) Oral 62 16 99 % 5\' 2"  (1.575 m) 71.3 kg   General appearance: alert and no distress Lungs: normal effort Heart: bradycardia Abdomen: soft, non-tender Extremities: no sign of DVT DTR's: +1,    Results for orders placed or performed during the hospital encounter of 10/06/22 (from the past 24 hour(s))  CBC   Collection Time: 10/06/22  8:11 AM  Result Value Ref Range   WBC 4.6 4.0 - 10.5 K/uL   RBC 3.87 3.87 - 5.11 MIL/uL   Hemoglobin 11.0 (L) 12.0 - 15.0 g/dL   HCT 33.7 (L) 36.0 - 46.0 %   MCV 87.1 80.0 - 100.0 fL   MCH 28.4 26.0 - 34.0 pg   MCHC 32.6 30.0 - 36.0 g/dL   RDW 12.1 11.5 - 15.5 %   Platelets 144 (L) 150 - 400 K/uL   nRBC 0.0 0.0 - 0.2 %  Comprehensive metabolic panel   Collection Time: 10/06/22  8:11 AM  Result Value Ref Range   Sodium 139 135 - 145 mmol/L   Potassium 3.9 3.5 - 5.1 mmol/L   Chloride 107 98 - 111 mmol/L   CO2 21 (L) 22 - 32 mmol/L   Glucose, Bld 93 70 - 99 mg/dL   BUN 11 6 -  20 mg/dL    Creatinine, Ser 0.65 0.44 - 1.00 mg/dL   Calcium 8.6 (L) 8.9 - 10.3 mg/dL   Total Protein 5.5 (L) 6.5 - 8.1 g/dL   Albumin 2.6 (L) 3.5 - 5.0 g/dL   AST 20 15 - 41 U/L   ALT 17 0 - 44 U/L   Alkaline Phosphatase 73 38 - 126 U/L   Total Bilirubin 0.5 0.3 - 1.2 mg/dL   GFR, Estimated >60 >60 mL/min   Anion gap 11 5 - 15    Patient Active Problem List   Diagnosis Date Noted   Severe pre-eclampsia, postpartum condition or complication [id number]   Severe preeclampsia 10/06/2022   Preterm premature rupture of membranes 09/29/2022   Vulvar varicose veins 08/09/2022   Pyelectasis of fetus on prenatal ultrasound 06/24/2022   Cervical high risk HPV (human papillomavirus) test positive 05/04/2022   Rubella non-immune status, antepartum 04/28/2022   Supervision of other high risk pregnancies, unspecified trimester 04/27/2022   AMA (advanced maternal age) multigravida 35+ 04/27/2022   Opioid use disorder, mild, in sustained remission, on maintenance therapy ([place]) 04/27/2022   Fracture of patella, right, open 06/13/2021   Sacral fracture ([place]) 06/13/2021   Pelvic pain in female 05/16/2016   Missed abortion     Assessment/Plan:  MISHEEL JOKINEN is a 37 y.o. [id number] PPD# 6 s/p SVD on 3/15 admitted for postpartum pre-eclampsia.   #Pre-eclampsia: headache unresolved after medication. Admit to [facility] [place] for mag sulfate. Admission orders placed by Dr. Harolyn Rutherford.    Renee Harder, CNM  10/06/2022, 9:23 AM

## 2022-10-06 NOTE — Plan of Care (Signed)
  Problem: Health Behavior/Discharge Planning: Goal: Ability to manage health-related needs will improve Outcome: Completed/Met   

## 2022-10-06 NOTE — Plan of Care (Signed)
  Problem: Education: Goal: Knowledge of Childbirth will improve Outcome: Completed/Met Goal: Ability to make informed decisions regarding treatment and plan of care will improve Outcome: Completed/Met Goal: Ability to state and carry out methods to decrease the pain will improve Outcome: Completed/Met Goal: Individualized Educational Video(s) Outcome: Completed/Met   Problem: Coping: Goal: Ability to verbalize concerns and feelings about labor and delivery will improve Outcome: Completed/Met   Problem: Life Cycle: Goal: Ability to make normal progression through stages of labor will improve Outcome: Completed/Met Goal: Ability to effectively push during vaginal delivery will improve Outcome: Completed/Met   Problem: Safety: Goal: Risk of complications during labor and delivery will decrease Outcome: Completed/Met   Problem: Pain Management: Goal: Relief or control of pain from uterine contractions will improve Outcome: Completed/Met   Problem: Health Behavior/Discharge Planning: Goal: Ability to manage health-related needs will improve Outcome: Completed/Met

## 2022-10-07 ENCOUNTER — Other Ambulatory Visit (HOSPITAL_COMMUNITY): Payer: Self-pay

## 2022-10-07 DIAGNOSIS — O1415 Severe pre-eclampsia, complicating the puerperium: Secondary | ICD-10-CM

## 2022-10-07 DIAGNOSIS — Z3A35 35 weeks gestation of pregnancy: Secondary | ICD-10-CM

## 2022-10-07 LAB — CBC
HCT: 41.3 % (ref 36.0–46.0)
Hemoglobin: 13.6 g/dL (ref 12.0–15.0)
MCH: 27.8 pg (ref 26.0–34.0)
MCHC: 32.9 g/dL (ref 30.0–36.0)
MCV: 84.5 fL (ref 80.0–100.0)
Platelets: 187 10*3/uL (ref 150–400)
RBC: 4.89 MIL/uL (ref 3.87–5.11)
RDW: 12 % (ref 11.5–15.5)
WBC: 5.3 10*3/uL (ref 4.0–10.5)
nRBC: 0 % (ref 0.0–0.2)

## 2022-10-07 LAB — COMPREHENSIVE METABOLIC PANEL
ALT: 18 U/L (ref 0–44)
AST: 19 U/L (ref 15–41)
Albumin: 3 g/dL — ABNORMAL LOW (ref 3.5–5.0)
Alkaline Phosphatase: 89 U/L (ref 38–126)
Anion gap: 12 (ref 5–15)
BUN: 11 mg/dL (ref 6–20)
CO2: 24 mmol/L (ref 22–32)
Calcium: 7.1 mg/dL — ABNORMAL LOW (ref 8.9–10.3)
Chloride: 101 mmol/L (ref 98–111)
Creatinine, Ser: 0.69 mg/dL (ref 0.44–1.00)
GFR, Estimated: >60 mL/min (ref >60)
Glucose, Bld: 97 mg/dL (ref 70–99)
Potassium: 3.2 mmol/L — ABNORMAL LOW (ref 3.5–5.1)
Sodium: 137 mmol/L (ref 135–145)
Total Bilirubin: 0.5 mg/dL (ref 0.3–1.2)
Total Protein: 6.3 g/dL — ABNORMAL LOW (ref 6.5–8.1)

## 2022-10-07 LAB — MAGNESIUM: Magnesium: 6.5 mg/dL (ref 1.7–2.4)

## 2022-10-07 MED ORDER — OXYCODONE HCL 5 MG PO TABS
5.0000 mg | ORAL_TABLET | ORAL | 0 refills | Status: DC | PRN
  Filled 2022-10-07: qty 10, 2d supply, fill #0

## 2022-10-07 MED ORDER — POTASSIUM CHLORIDE CRYS ER 20 MEQ PO TBCR
40.0000 meq | EXTENDED_RELEASE_TABLET | Freq: Once | ORAL | Status: AC
  Administered 2022-10-07: 40 meq via ORAL
  Filled 2022-10-07: qty 2

## 2022-10-07 MED ORDER — CYCLOBENZAPRINE HCL 10 MG PO TABS
10.0000 mg | ORAL_TABLET | Freq: Three times a day (TID) | ORAL | 0 refills | Status: DC | PRN
  Filled 2022-10-07: qty 30, 10d supply, fill #0

## 2022-10-07 MED ORDER — ENALAPRIL MALEATE 2.5 MG PO TABS
5.0000 mg | ORAL_TABLET | Freq: Every day | ORAL | Status: DC
  Administered 2022-10-07: 5 mg via ORAL
  Filled 2022-10-07: qty 2

## 2022-10-07 MED ORDER — FUROSEMIDE 20 MG PO TABS
20.0000 mg | ORAL_TABLET | Freq: Two times a day (BID) | ORAL | 0 refills | Status: DC
  Filled 2022-10-07: qty 8, 4d supply, fill #0

## 2022-10-07 MED ORDER — OXYCODONE HCL 5 MG PO TABS
5.0000 mg | ORAL_TABLET | ORAL | Status: DC | PRN
  Administered 2022-10-07: 5 mg via ORAL
  Filled 2022-10-07: qty 1

## 2022-10-07 MED ORDER — ENALAPRIL MALEATE 5 MG PO TABS
5.0000 mg | ORAL_TABLET | Freq: Every day | ORAL | 0 refills | Status: DC
  Filled 2022-10-07: qty 30, 30d supply, fill #0

## 2022-10-07 NOTE — Lactation Note (Signed)
This note was copied from a baby's chart.  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 She also voiced that she got her pump from her insurance company and she plan on getting back to her pumping schedule after her discharge. Revised discharge education, pumping schedule and anticipatory guidelines.   Objective Infant data: Mother's Current Feeding Choice: Breast Milk  Infant feeding assessment Scale for Readiness: 2  Maternal data: [id number]  Vaginal, Spontaneous Pumping frequency: 2 times/24 hours due to maternal re-admission Pumped volume: 20 mL (20-30 ml) Flange Size: 24  WIC Program: Yes WIC Referral Sent?: Yes What county?: [place] Pump: Personal (DEBP from [employer])  Assessment Infant: Feeding Status: -- (Continuous gastric feedings)  Maternal: Milk volume: Low  Intervention/Plan Interventions: Interventions: Breast feeding basics reviewed; DEBP; Education  Discharge Education: Engorgement and breast care  Tools: Pump; Flanges Pump Education: Setup, frequency, and cleaning; Milk Storage  Plan: Encouraged to start pumping every 3 hours; ideally 8 pumping sessions/24 hours She'll switch her pump settings from initiation to expression mode She'll call for assistance once baby is ready to go to breast   No other support person at this time. All questions and concerns answered, family to contact [facility] services PRN.  Consult Status: NICU follow-up  NICU Follow-up type: Weekly NICU follow up   [place] 10/07/2022, 4:44 PM

## 2022-10-07 NOTE — Discharge Summary (Signed)
Physician Discharge Summary  Patient ID: Kristy Hunt MRN: [id number] DOB/AGE: 11/15/1985 37 y.o.  Admit date: 10/06/2022 Discharge date: 10/07/2022  Admission Diagnoses:  Discharge Diagnoses:  Principal Problem:   Severe pre-eclampsia, postpartum condition or complication   Discharged Condition: Stable  Hospital Course: Patient was admitted on PPD#6 after vaginal delivery for severe preeclampsia; had elevated BP and severe HA. Normal labs. Patient received magnesium sulfate for eclampsia prophylaxis as per protocol. BP control was obtained with Procardia XL 30 mg qd and she was started on Lasix 20 mg po bid x 5 days, there were no further immediate complications. Given her persistent headache, her medication was switched to Enalapril 5 mg po qd.  After the infusion, her BP was stable. She was ambulating, tolerating a regular diet, and urinating well. Patient is discharged home in stable condition on 10/07/2022, and will follow up in the office for BP check on 10/12/22 as scheduled.  Consults: None  Significant Diagnostic Studies:     Latest Ref Rng & Units 10/07/2022    5:39 AM 10/06/2022    8:11 AM 09/29/2022    7:28 PM  CBC  WBC 4.0 - 10.5 K/uL 5.3  4.6  8.7   Hemoglobin 12.0 - 15.0 g/dL 13.6  11.0  11.6   Hematocrit 36.0 - 46.0 % 41.3  33.7  34.9   Platelets 150 - 400 K/uL 187  144  165       Latest Ref Rng & Units 10/07/2022    5:39 AM 10/06/2022    8:11 AM 09/22/2022    6:37 PM  CMP  Glucose 70 - 99 mg/dL 97  93  121   BUN 6 - 20 mg/dL 11  11  12    Creatinine 0.44 - 1.00 mg/dL 0.69  0.65  0.69   Sodium 135 - 145 mmol/L 137  139  136   Potassium 3.5 - 5.1 mmol/L 3.2  3.9  3.4   Chloride 98 - 111 mmol/L 101  107  106   CO2 22 - 32 mmol/L 24  21  19    Calcium 8.9 - 10.3 mg/dL 7.1  8.6  8.9   Total Protein 6.5 - 8.1 g/dL 6.3  5.5  5.8   Total Bilirubin 0.3 - 1.2 mg/dL 0.5  0.5  0.6   Alkaline Phos 38 - 126 U/L 89  73  75   AST 15 - 41 U/L 19  20  15    ALT 0 - 44 U/L 18   17  12      Discharge Exam: Blood pressure 111/71, pulse 85, temperature (!) 96.8 F (36 C), temperature source Axillary, resp. rate 20, height 5\' 2"  (1.575 m), weight 71.3 kg, SpO2 100 %, currently breastfeeding. General: alert, cooperative, and no distress Lochia: appropriate Heart: RRR Lungs: CTAB, no respiratory distress Uterine Fundus: firm, NT DVT Evaluation: No evidence of DVT seen on physical exam.   Discharge disposition: 01-Home or Self Care   Allergies as of 10/07/2022       Reactions   Dilaudid [hydromorphone Hcl] Hives, Shortness Of Breath   Hydromorphone Hcl Shortness Of Breath, Hives   Morphine And Related Hives, Shortness Of Breath   Pt states she can tolerate Percocet   Other Shortness Of Breath   Other reaction(s): Hives Pt states she can tolerate Percocet   Epinephrine    Heart flutters         Medication List     TAKE these medications    acetaminophen 500  MG tablet Commonly known as: TYLENOL Take 2 tablets (1,000 mg total) by mouth every 8 (eight) hours as needed.   buprenorphine 2 MG Subl SL tablet Commonly known as: SUBUTEX Place 1 tablet (2 mg total) under the tongue daily.   cyclobenzaprine 10 MG tablet Commonly known as: FLEXERIL Take 1 tablet (10 mg total) by mouth 3 (three) times daily as needed for muscle spasms.   enalapril 5 MG tablet Commonly known as: VASOTEC Take 1 tablet (5 mg total) by mouth daily. Start taking on: October 08, 2022   famotidine 20 MG tablet Commonly known as: PEPCID Take 1 tablet (20 mg total) by mouth 2 (two) times daily.   furosemide 20 MG tablet Commonly known as: LASIX Take 1 tablet (20 mg total) by mouth 2 (two) times daily for 4 days.   ibuprofen 600 MG tablet Commonly known as: ADVIL Take 1 tablet (600 mg total) by mouth every 6 (six) hours.   magnesium 30 MG tablet Take 30 mg by mouth 2 (two) times daily.   oxyCODONE 5 MG immediate release tablet Commonly known as: Oxy IR/ROXICODONE Take 1  tablet (5 mg total) by mouth every 3 (three) hours as needed for breakthrough pain or severe pain.   prenatal multivitamin Tabs tablet Take 1 tablet by mouth daily at 12 noon.        Discharge planning time: 25 minutes  Signed: Verita Schneiders, MD 10/07/2022, 3:45 PM

## 2022-10-07 NOTE — Plan of Care (Signed)
Pt to be discharged home with printed instructions. Minor Iden L Shaylie Eklund, RN  

## 2022-10-11 ENCOUNTER — Telehealth (HOSPITAL_COMMUNITY): Payer: Self-pay

## 2022-10-11 ENCOUNTER — Telehealth: Payer: Self-pay | Admitting: Clinical

## 2022-10-11 NOTE — Telephone Encounter (Signed)
Preadmission notes

## 2022-10-11 NOTE — Telephone Encounter (Signed)
Attempt call regarding referral; Left HIPPA-compliant message to call back Roselyn Reef from [employer] for [employer] at [facility] at  [phone] [place] [facility] office); Left MyChart message for pt.

## 2022-10-12 ENCOUNTER — Ambulatory Visit (INDEPENDENT_AMBULATORY_CARE_PROVIDER_SITE_OTHER): Payer: Medicaid Other | Admitting: Family Medicine

## 2022-10-12 ENCOUNTER — Ambulatory Visit: Payer: Medicaid Other | Admitting: Family Medicine

## 2022-10-12 ENCOUNTER — Encounter: Payer: Medicaid Other | Admitting: Family Medicine

## 2022-10-12 ENCOUNTER — Telehealth (HOSPITAL_COMMUNITY): Payer: Self-pay | Admitting: *Deleted

## 2022-10-12 VITALS — BP 109/68 | HR 73 | Ht 63.0 in

## 2022-10-12 DIAGNOSIS — F1111 Opioid abuse, in remission: Secondary | ICD-10-CM | POA: Diagnosis not present

## 2022-10-12 DIAGNOSIS — O1415 Severe pre-eclampsia, complicating the puerperium: Secondary | ICD-10-CM

## 2022-10-12 MED ORDER — BUPRENORPHINE HCL 2 MG SL SUBL: 2.0000 mg | SUBLINGUAL_TABLET | Freq: Every day | SUBLINGUAL | 0 refills | Status: DC

## 2022-10-12 MED ORDER — DROSPIRENONE-ETHINYL ESTRADIOL 3-0.02 MG PO TABS: 1.0000 | ORAL_TABLET | Freq: Every day | ORAL | 3 refills | Status: DC

## 2022-10-12 NOTE — Telephone Encounter (Signed)
Mom reports she's just arrived at [facility] office for postpartum appt. Nurse gave patient phone number to call Perinatal Office if any concerns or questions about her healing.  Kristy Hunt, [place] 10-12-2022 at 1:44pm

## 2022-10-12 NOTE — Progress Notes (Signed)
   Subjective:    Patient ID: Kristy Hunt, female    DOB: November 05, 1985, 37 y.o.   MRN: [id number]  HPI Patient seen for mood check and BP check. She delivered about 12 days ago. Her BP was elevated and she was readmitted to the hospital for magnesium. She is on BP medication and doing well. The baby is doing well - still in the NICU. Making incremental changes.   Review of Systems     Objective:  BP 109/68   Pulse 73   Ht 5\' 3"  (1.6 m)   Breastfeeding Yes   BMI 27.85 kg/m    Physical Exam Vitals reviewed.  Constitutional:      Appearance: Normal appearance.  Cardiovascular:     Rate and Rhythm: Normal rate and regular rhythm.     Pulses: Normal pulses.  Pulmonary:     Effort: Pulmonary effort is normal.  Skin:    General: Skin is warm and dry.     Capillary Refill: Capillary refill takes less than 2 seconds.  Neurological:     General: No focal deficit present.     Mental Status: She is alert.  Psychiatric:        Mood and Affect: Mood normal.        Behavior: Behavior normal.        Thought Content: Thought content normal.        Judgment: Judgment normal.       Assessment & Plan:   1. Opioid use disorder, mild, in sustained remission, on maintenance therapy ([facility]) Refilled suboxone. No issues.  Mood stable  2. Severe pre-eclampsia, postpartum condition or complication BP normal Continue antihypertensive.

## 2022-10-12 NOTE — Progress Notes (Signed)
Patient is one week postpartum preterm delivery. Patient presents for bp check and mood check. Kathrene Alu RN

## 2022-10-13 ENCOUNTER — Ambulatory Visit: Payer: Medicaid Other

## 2022-10-16 ENCOUNTER — Telehealth: Payer: Self-pay | Admitting: Neonatology

## 2022-10-16 ENCOUNTER — Ambulatory Visit (HOSPITAL_COMMUNITY): Payer: Self-pay

## 2022-10-16 NOTE — Lactation Note (Signed)
This note was copied from a baby's chart. Lactation Consultation Note  Patient Name: Kristy Hunt [id number] Date: 10/16/2022 Age:38 wk.o.   RN Tisa called this [place] to notify that Ms. Mcqueen came back to baby's room but that she didn't need any LC assistance for today. She's pumping consistently with her pump at home and bringing milk for baby. She's aware of NICU LC services and will call PRN.   [place] 10/16/2022, 7:14 PM

## 2022-10-18 ENCOUNTER — Ambulatory Visit (HOSPITAL_COMMUNITY): Payer: Self-pay

## 2022-10-18 NOTE — Lactation Note (Signed)
This note was copied from a baby's chart.  NICU Lactation Consultation Note  Patient Name: Kristy Hunt [id number] Date: 10/18/2022 Age:38 wk.o.  Reason for consult: Weekly NICU follow-up; Late-preterm 34-36.6wks; Other (Comment); NICU baby (OUD (suboxone))  SUBJECTIVE Visited with family of 53 21/43 weeks old [place] NICU female; Ms. Cerra is a P4 and reports she's pumping every 3 hours, but not getting milk at every pumping session (see Objective/Maternal Data). Her plan is to take baby to breast once he's ready, asked her to call for latch assistance when needed. Reviewed pumping schedule, strategies to increase her supply and anticipatory guidelines.   OBJECTIVE Infant data: Mother's Current Feeding Choice: Breast Milk and Formula  Infant feeding assessment Scale for Readiness: 3   Maternal data: [id number]  Vaginal, Spontaneous Pumping frequency: 3-4 times/24 hours (getting milk) but per mom the other pumping sessions she's not getting enough to collect into bottles Pumped volume: 75 mL Flange Size: 24  WIC Program: Yes WIC Referral Sent?: Yes What county?: [place] Pump: Personal (DEBP from [employer])  ASSESSMENT Infant: Feeding Status: Scheduled 9-12-3-6  Maternal: Milk volume: Low  INTERVENTIONS/PLAN Interventions: Interventions: Breast feeding basics reviewed; DEBP; Education; Infant Driven Feeding Algorithm education Tools: Pump; Flanges Pump Education: Setup, frequency, and cleaning  Plan: Encouraged pumping every 3 hours; ideally 8 pumping sessions/24 hours She'll try power pumping in the AM She'll call for latch assistance once baby is ready to go to breast   No other support person at this time. All questions and concerns answered, family to contact [facility] services PRN.  Consult Status: NICU follow-up NICU Follow-up type: Weekly NICU follow up   [place] 10/18/2022, 12:34 PM

## 2022-10-20 ENCOUNTER — Encounter: Payer: Medicaid Other | Admitting: Family Medicine

## 2022-10-22 ENCOUNTER — Ambulatory Visit (HOSPITAL_COMMUNITY): Payer: Self-pay

## 2022-10-22 NOTE — Lactation Note (Signed)
This note was copied from a baby's chart.  NICU Lactation Consultation Note  Patient Name: Kristy Hunt [id number] Date: 10/22/2022 Age:38 wk.o.  Reason for consult: Follow-up assessment; Mother's request; NICU baby; Early term 57-38.6wks  SUBJECTIVE  Mom request lactation consultation.  Mom has concerns regarding her home pump.  Mom reports she isn't getting any suction with it.  Motor is functioning per Mom.  LC suggested she check all the connections to make sure they are tight.  Mom unaware of flange size as suggested she check her flanges to make sure they are the correct size.  Mom admits she isn't pumping as frequently as she should, about 4 times per 24 hrs.  This morning she pumped using the [employer] and expressed 90 ml.  No signs of engorgement noted.  Mom also encouraged to call [email or web address] to inquire what they will do.    Baby isn't being fed po due to bradycardic event with feedings and reflux symptoms.  NG feedings at 90 mins currently with HOB elevated.  Mom aware of lactation support and encouraged asking for help prn  Encouraged more frequent pumping to support a full milk supply and prevent engorgement. Goal of 8 times per 24 hrs shared.  OBJECTIVE Infant data:  Infant feeding assessment Scale for Readiness: 2   Maternal data: [id number]  Vaginal, Spontaneous Pumping frequency: 4-6 times per 24 hrs Pumped volume: 90 mL (expressed 90 ml this am as she hadn't pumped over night) Flange Size: 21  WIC Program: Yes WIC Referral Sent?: Yes What county?: [place] Pump: Personal, DEBP  ASSESSMENT Infant: Feeding Status: Scheduled 9-12-3-6  Maternal: Milk volume: Low  INTERVENTIONS/PLAN Interventions: Interventions: Breast feeding basics reviewed; Skin to skin; Breast massage; Hand express; DEBP; Education Tools: Pump; Flanges; Hands-free pumping top Pump Education: Setup, frequency, and cleaning; Milk Storage  Plan: Consult Status: NICU  follow-up NICU Follow-up type: Weekly NICU follow up   Kristy Hunt 10/22/2022, 2:00 PM

## 2022-10-24 ENCOUNTER — Telehealth: Payer: Self-pay

## 2022-10-24 NOTE — Telephone Encounter (Signed)
Patient called and states that she has headaches and her BP cuff at home is "all over the place". Patient states son is still in NICU and she came home to eat and take a shower. Patient took her BP med, a flexeril and 400mg  of ibuprofen. Patient plans to go back to hospital to be with her baby and I instructed her to go to MAU for bp check if headache not relieved from the medicine she recently took. Patient states understanding. Armandina Stammer RN

## 2022-10-25 ENCOUNTER — Ambulatory Visit (HOSPITAL_COMMUNITY): Payer: Self-pay

## 2022-10-25 NOTE — Lactation Note (Signed)
This note was copied from a baby's chart.  NICU Lactation Consultation Note  Patient Name: Kristy Hunt [id number]'E Date: 10/25/2022 Age:38 wk.o.  Reason for consult: Follow-up assessment; NICU baby; Early term 34-38.6wks; Infant < 6lbs; Breastfeeding assistance  SUBJECTIVE  LC in to assist/assess baby "Kristy Hunt" at the breast.  Anise Salvo SLP stated that baby latched and fed for 10 mins yesterday.    Mom last double pumped 2 hrs prior.  No need to pre-pump before breastfeeding.  Mom semi-reclined in the chair and baby placed cross cradle on the left breast.  LC assisted Mom to support baby's head ear to ear and support/sandwich her breast in a U hold.  Mom holding her breast close to nipple.  Guided her hand back away from areola.   Baby not opening his mouth wide enough after repeated attempts.  LC asked Mom if she could try. Briefly took Kristy Hunt back away from the warmth and comfort of Mom's breast.  Assisted Mom to sandwich her breast to match baby's mouth, and after a couple attempts, baby able to attain and sustain depth on the breast.  Baby noted to demonstrate nutritive sucking with drops of jaw.  Mom taught to do gently breast compression during sucking bursts.  No pinching felt, nipple rounded and baby relaxed during entire feeding.  Baby became sleepy after 5 mins on the breast.   Encouraged Mom to double pump after breastfeeding to support a full milk supply.  OBJECTIVE Infant data: Mother's Current Feeding Choice: Breast Milk  Infant feeding assessment Scale for Readiness: 2   Maternal data: [id number]  Vaginal, Spontaneous Pumping frequency: Encouraged pumping every 3 hrs, Mom unsure of frequency Pumped volume: 90 mL  WIC Program: Yes WIC Referral Sent?: Yes What county?: [place] Pump: Personal, DEBP  ASSESSMENT Infant: LATCH Documentation Latch: 1 Audible Swallowing: 2 (for 5 mins) Type of Nipple: 2 Comfort (Breast/Nipple): 2 Hold (Positioning): 1 LATCH Score:  8  Feeding Status: IDF-2; Scheduled 9-12-3-6  Maternal: Milk volume: Normal  INTERVENTIONS/PLAN Interventions: Interventions: Breast feeding basics reviewed; Assisted with latch; Skin to skin; Breast massage; Hand express; Breast compression; Adjust position; Support pillows; Position options; DEBP; Education Tools: Pump; Flanges  Plan: Consult Status: NICU follow-up NICU Follow-up type: Assist with IDF-2 (Mother does not need to pre-pump before breastfeeding)   Kristy Hunt 10/25/2022, 1:02 PM

## 2022-10-31 ENCOUNTER — Ambulatory Visit (HOSPITAL_COMMUNITY): Payer: Self-pay

## 2022-10-31 NOTE — Lactation Note (Signed)
This note was copied from a baby's chart.  NICU Lactation Consultation Note  Patient Name: Kristy Hunt [id number] Date: 10/31/2022 Age:38 wk.o.  Reason for consult: Follow-up assessment; NICU baby; Early term 7-38.6wks; Infant < 6lbs  SUBJECTIVE  LC in to visit with P4 Mom of ET infant on day of baby's discharge from the NICU.   Mom reports baby is latching well, no pain, and feeding for 10-15 mins.  Baby is being supplemented with 24 cal formula/EBM by paced bottle.    Mom has noticed her milk supply has dropped (BP medication or increased stressors).  Mom desires OP lactation F/U Mom provided with recipe for lactation cookies.  Encouraged Mom to offer the breast often with feeding cues.  If baby is supplemented, Mom encouraged to double pump for 15-30 mins.  OBJECTIVE Infant data: No data recorded Infant feeding assessment Scale for Readiness: 1 Scale for Quality: 2   Maternal data: [id number]  Vaginal, Spontaneous Pumped volume: 45 mL (-60 ml)  WIC Program: Yes WIC Referral Sent?: Yes What county?: [place] Pump: Personal, DEBP  ASSESSMENT Infant: Feeding Status: Ad lib  Maternal: Milk volume: Low  INTERVENTIONS/PLAN Interventions: Interventions: Breast feeding basics reviewed; Skin to skin; Hand express; Breast massage; DEBP; Education; Pace feeding Discharge Education: Outpatient recommendation; Outpatient Epic message sent Tools: Pump; Flanges; Bottle  Plan: Consult Status: Complete NICU Follow-up type: Baby's discharge   [name] 10/31/2022, 11:02 AM

## 2022-11-02 ENCOUNTER — Encounter: Payer: Self-pay | Admitting: Family Medicine

## 2022-11-02 ENCOUNTER — Ambulatory Visit (INDEPENDENT_AMBULATORY_CARE_PROVIDER_SITE_OTHER): Payer: Medicaid Other | Admitting: Family Medicine

## 2022-11-02 DIAGNOSIS — Z23 Encounter for immunization: Secondary | ICD-10-CM | POA: Diagnosis not present

## 2022-11-02 DIAGNOSIS — R87810 Cervical high risk human papillomavirus (HPV) DNA test positive: Secondary | ICD-10-CM

## 2022-11-02 DIAGNOSIS — F1111 Opioid abuse, in remission: Secondary | ICD-10-CM

## 2022-11-02 MED ORDER — BUPRENORPHINE HCL 2 MG SL SUBL: 2.0000 mg | SUBLINGUAL_TABLET | Freq: Every day | SUBLINGUAL | 0 refills | Status: DC

## 2022-11-02 NOTE — Progress Notes (Signed)
Post Partum Visit Note  Kristy Hunt is a 37 y.o. [id number] female who presents for a postpartum visit. She is 4 weeks postpartum following a normal spontaneous vaginal delivery.  I have fully reviewed the prenatal and intrapartum course. The delivery was at 34.2 gestational weeks.  Anesthesia: epidural. Postpartum course has been normal. Baby is doing well after NICU stay. Baby is feeding by both breast and bottle - Similac Neosure. Bleeding no bleeding. Bowel function is normal. Bladder function is normal. Patient is not sexually active. Contraception method is OCP (estrogen/progesterone). Postpartum depression screening: negative.   Upstream - 11/02/22 1038       Pregnancy Intention Screening   Does the patient want to become pregnant in the next year? No    Does the patient's partner want to become pregnant in the next year? No    Would the patient like to discuss contraceptive options today? No      Contraception Wrap Up   Current Method Oral Contraceptive    End Method Oral Contraceptive    Contraception Counseling Provided No    How was the end contraceptive method provided? N/A            The pregnancy intention screening data noted above was reviewed. Potential methods of contraception were discussed. The patient elected to proceed with Oral Contraceptive.   Edinburgh Postnatal Depression Scale - 11/02/22 1037       Edinburgh Postnatal Depression Scale:  In the Past 7 Days   I have been able to laugh and see the funny side of things. 0    I have looked forward with enjoyment to things. 0    I have blamed myself unnecessarily when things went wrong. 0    I have been anxious or worried for no good reason. 1    I have felt scared or panicky for no good reason. 1    Things have been getting on top of me. 0    I have been so unhappy that I have had difficulty sleeping. 0    I have felt sad or miserable. 1    I have been so unhappy that I have been crying. 0    The  thought of harming myself has occurred to me. 0    Edinburgh Postnatal Depression Scale Total 3             Health Maintenance Due  Topic Date Due   COVID-19 Vaccine (1) Never done    The following portions of the patient's history were reviewed and updated as appropriate: allergies, current medications, past family history, past medical history, past social history, past surgical history, and problem list.  Review of Systems Pertinent items are noted in HPI.  Objective:  BP 120/65   Pulse 67   Wt 146 lb (66.2 kg)   Breastfeeding Yes   BMI 25.86 kg/m    General:  alert, cooperative, and no distress   Breasts:  not indicated  Lungs: clear to auscultation bilaterally  Heart:  regular rate and rhythm, S1, S2 normal, no murmur, click, rub or gallop  Abdomen: soft, non-tender; bowel sounds normal; no masses,  no organomegaly   Wound N/a  GU exam:  not indicated       Assessment:   1. Postpartum care and examination   2. Need for HPV vaccine   3. Opioid use disorder, mild, in sustained remission, on maintenance therapy   4. Cervical high risk HPV (human papillomavirus) test positive  Plan:   Essential components of care per ACOG recommendations:  1.  Mood and well being: Patient with negative depression screening today. Reviewed local resources for support.  - Patient tobacco use? No.   - hx of drug use? No.    2. Infant care and feeding:  -Patient currently breastmilk feeding? Yes. Reviewed importance of draining breast regularly to support lactation.  -Social determinants of health (SDOH) reviewed in EPIC. No concerns  3. Sexuality, contraception and birth spacing - Patient does not want a pregnancy in the next year.    - Reviewed reproductive life planning. Reviewed contraceptive methods based on pt preferences and effectiveness.  Patient desired Oral Contraceptive today.   - Discussed birth spacing of 18 months  4. Sleep and fatigue -Encouraged  family/partner/community support of 4 hrs of uninterrupted sleep to help with mood and fatigue  5. Physical Recovery  - Discussed patients delivery and complications. She describes her labor as good. - Patient had a Vaginal, no problems at delivery. Patient had  no  laceration. Perineal healing reviewed. Patient expressed understanding - Patient has urinary incontinence? No. - Patient is safe to resume physical and sexual activity  6.  Health Maintenance - HM due items addressed Yes - Last pap smear  Diagnosis  Date Value Ref Range Status  04/27/2022   Final   - Negative for intraepithelial lesion or malignancy (NILM)   Pap smear not done at today's visit.  -Breast Cancer screening indicated? No.   7. Chronic Disease/Pregnancy Condition follow up:  HR HPV  - PCP follow up  Levie Heritage, DO Center for [facility], [facility]

## 2022-11-08 ENCOUNTER — Telehealth: Payer: Self-pay | Admitting: Neonatology

## 2022-11-09 ENCOUNTER — Encounter: Payer: Medicaid Other | Admitting: Family Medicine

## 2022-11-11 ENCOUNTER — Telehealth: Payer: Self-pay | Admitting: Clinical

## 2022-11-11 NOTE — Telephone Encounter (Signed)
Attempt call regarding referral; Unable to leave voice message.

## 2022-11-12 ENCOUNTER — Encounter: Payer: Self-pay | Admitting: Family Medicine

## 2022-11-13 ENCOUNTER — Other Ambulatory Visit: Payer: Self-pay | Admitting: Family Medicine

## 2022-11-22 MED ORDER — BUPRENORPHINE HCL 2 MG SL SUBL: 2.0000 mg | SUBLINGUAL_TABLET | Freq: Every day | SUBLINGUAL | 0 refills | Status: DC

## 2022-11-22 NOTE — Addendum Note (Signed)
Addended by: Levie Heritage on: 11/22/2022 05:22 PM   Modules accepted: Orders

## 2022-12-02 ENCOUNTER — Telehealth: Payer: Self-pay | Admitting: Pediatrics

## 2022-12-02 NOTE — Telephone Encounter (Unsigned)
NAS postpartum telephone consult attempted but patient's phone number is no longer in service.  NAS consult phone number is [phone] if needs arise.Will attempt to follow again on 6/21.

## 2022-12-06 ENCOUNTER — Telehealth: Payer: Self-pay

## 2022-12-06 NOTE — Telephone Encounter (Signed)
Called pharmacy benefits for prior authorization for sublingual subutex.  Spoke with Judeth Cornfield with patient insurance coverage and call record number [id number]. Her prior authorization expired for the SL subutex in April- got extension of PA. The representative let me know that we can do this PA 30 days before it expires next year (if approved for one year). Armandina Stammer RN

## 2022-12-06 NOTE — Telephone Encounter (Signed)
Can you call the patients pharmacy and see what the hold up is? It doesn't seem like she needed a PA form during pregnancy. This needs to be done ASAP. The patient does not tolerate suboxone (buprenorphine with nalaxone) due to vomiting and other side effects.

## 2022-12-29 ENCOUNTER — Ambulatory Visit (INDEPENDENT_AMBULATORY_CARE_PROVIDER_SITE_OTHER): Payer: Medicaid Other | Admitting: Family Medicine

## 2022-12-29 ENCOUNTER — Encounter: Payer: Self-pay | Admitting: Family Medicine

## 2022-12-29 ENCOUNTER — Other Ambulatory Visit (HOSPITAL_COMMUNITY)
Admission: RE | Admit: 2022-12-29 | Discharge: 2022-12-29 | Disposition: A | Discharge status: Home / Self Care | Payer: Medicaid Other | Source: Ambulatory Visit | Attending: Family Medicine | Admitting: Family Medicine

## 2022-12-29 VITALS — BP 97/64 | HR 70 | Ht 63.0 in | Wt 150.0 lb

## 2022-12-29 DIAGNOSIS — Z1339 Encounter for screening examination for other mental health and behavioral disorders: Secondary | ICD-10-CM

## 2022-12-29 DIAGNOSIS — Z23 Encounter for immunization: Secondary | ICD-10-CM

## 2022-12-29 DIAGNOSIS — F1111 Opioid abuse, in remission: Secondary | ICD-10-CM

## 2022-12-29 DIAGNOSIS — Z01419 Encounter for gynecological examination (general) (routine) without abnormal findings: Secondary | ICD-10-CM | POA: Insufficient documentation

## 2022-12-29 NOTE — Progress Notes (Signed)
ANNUAL EXAM Patient name: Kristy Hunt MRN [id number]  Date of birth: Oct 26, 1985 Chief Complaint:   Annual Exam  History of Present Illness:   Kristy Hunt is a 37 y.o.  [id number]  female  being seen today for a routine annual exam.  Current complaints: none  No LMP recorded (lmp unknown). (Menstrual status: Oral contraceptives).    Last pap 2023: +HPV, normal cytology.  Last mammogram: n/a.     12/29/2022    2:24 PM 08/17/2022    9:26 AM 04/27/2022    9:45 AM 11/08/2016    4:07 PM 05/16/2016    8:08 AM  Depression screen PHQ 2/9  Decreased Interest 0 1 0 0 1  Down, Depressed, Hopeless 1 1 0 1 1  PHQ - 2 Score 1 2 0 1 2  Altered sleeping 0 1 1 1  0  Tired, decreased energy 1 1 1 1 1   Change in appetite 0 1 1 0 1  Feeling bad or failure about yourself  0 0 0 0 0  Trouble concentrating 0 1 0 0 0  Moving slowly or fidgety/restless 0 0 0 0 0  Suicidal thoughts 0 0 0 0 0  PHQ-9 Score 2 6 3 3 4         12/29/2022    2:24 PM 08/17/2022    9:27 AM 04/27/2022    9:45 AM 11/08/2016    4:07 PM  GAD 7 : Generalized Anxiety Score  Nervous, Anxious, on Edge 1 1 1  0  Control/stop worrying 1 0 0 0  Worry too much - different things 1 1 0 0  Trouble relaxing 0 0 0 0  Restless 0 1 0 0  Easily annoyed or irritable 1 1 1  0  Afraid - awful might happen 0 0 0 0  Total GAD 7 Score 4 4 2  0     Review of Systems:   Pertinent items are noted in HPI Denies any headaches, blurred vision, fatigue, shortness of breath, chest pain, abdominal pain, abnormal vaginal discharge/itching/odor/irritation, problems with periods, bowel movements, urination, or intercourse unless otherwise stated above. Pertinent History Reviewed:  Reviewed past medical,surgical, social and family history.  Reviewed problem list, medications and allergies. Physical Assessment:   Vitals:   12/29/22 1358  BP: 97/64  Pulse: 70  Weight: 150 lb (68 kg)  Height: 5\' 3"  (1.6 m)  Body mass index is 26.57  kg/m.        Physical Examination:   General appearance - well appearing, and in no distress  Mental status - alert, oriented to person, place, and time  Psych:  She has a normal mood and affect  Skin - warm and dry, normal color, no suspicious lesions noted  Chest - effort normal, all lung fields clear to auscultation bilaterally  Heart - normal rate and regular rhythm  Neck:  midline trachea, no thyromegaly or nodules  Breasts - breasts appear normal, no suspicious masses, no skin or nipple changes or axillary nodes  Abdomen - soft, nontender, nondistended, no masses or organomegaly  Pelvic - VULVA: normal appearing vulva with no masses, tenderness or lesions  VAGINA: normal appearing vagina with normal color and discharge, no lesions  CERVIX: normal appearing cervix without discharge or lesions, no CMT  Thin prep pap is done with HR HPV cotesting  UTERUS: uterus is felt to be normal size, shape, consistency and nontender   ADNEXA: No adnexal masses or tenderness noted.  Extremities:  No swelling or varicosities  noted  Chaperone present for exam  Assessment & Plan:  1. Need for HPV vaccine - HPV 9-valent vaccine,Recombinat  2. Well woman exam with routine gynecological exam - Cytology - PAP( [place])  3. Opioid use disorder, mild, in sustained remission, on maintenance therapy (HCC) Stable on current dose.   Labs/procedures today:   Orders Placed This Encounter  Procedures   HPV 9-valent vaccine,Recombinat    Meds: No orders of the defined types were placed in this encounter.   Follow-up: No follow-ups on file.  Levie Heritage, DO 12/29/2022 4:10 PM

## 2023-01-03 LAB — CYTOLOGY - PAP
Comment: NEGATIVE
Comment: NEGATIVE
Comment: NEGATIVE
Diagnosis: UNDETERMINED — AB
HPV 16: NEGATIVE
HPV 18 / 45: NEGATIVE
High risk HPV: POSITIVE — AB

## 2023-01-05 ENCOUNTER — Telehealth: Payer: Self-pay

## 2023-01-05 NOTE — Telephone Encounter (Signed)
-----   Message from Levie Heritage, DO sent at 01/05/2023 10:46 AM EDT ----- Needs to be scheduled for a colposcopy.

## 2023-01-05 NOTE — Telephone Encounter (Signed)
Called patient to inform her that her Pap smear is abnormal and to schedule her for a Colposcopy. Unable to leave vm due to invalid number. A Mychart message will be sent to the patient. Dalilah Curlin l Linda Grimmer, CMA

## 2023-01-13 MED ORDER — BUPRENORPHINE HCL 2 MG SL SUBL: 2.0000 mg | SUBLINGUAL_TABLET | Freq: Every day | SUBLINGUAL | 0 refills | Status: DC

## 2023-02-01 ENCOUNTER — Telehealth: Payer: Self-pay | Admitting: Pediatrics

## 2023-02-01 NOTE — Telephone Encounter (Signed)
NAS telephone consult attempted without answer.  HIPAA compliant message left requesting a return call or text. Will follow again on 8/16.

## 2023-02-08 ENCOUNTER — Other Ambulatory Visit (HOSPITAL_COMMUNITY)
Admission: RE | Admit: 2023-02-08 | Discharge: 2023-02-08 | Disposition: A | Discharge status: Home / Self Care | Payer: Medicaid Other | Source: Ambulatory Visit | Attending: Family Medicine | Admitting: Family Medicine

## 2023-02-08 ENCOUNTER — Ambulatory Visit: Payer: Medicaid Other | Admitting: Family Medicine

## 2023-02-08 ENCOUNTER — Encounter: Payer: Self-pay | Admitting: Family Medicine

## 2023-02-08 VITALS — BP 111/73 | HR 67 | Wt 148.0 lb

## 2023-02-08 DIAGNOSIS — R87810 Cervical high risk human papillomavirus (HPV) DNA test positive: Secondary | ICD-10-CM

## 2023-02-08 NOTE — Addendum Note (Signed)
Addended by: Levie Heritage on: 02/08/2023 11:42 AM   Modules accepted: Level of Service

## 2023-02-08 NOTE — Progress Notes (Signed)
Patient Name: Kristy Hunt, female   DOB: Jul 01, 1986, 37 y.o.  MRN: [id number]  Colposcopy Procedure Note:  [id number] Pregnancy status: Unknown Lab Results  Component Value Date   DIAGPAP (A) 12/29/2022    - Atypical squamous cells of undetermined significance (ASC-US)   DIAGPAP  04/27/2022    - Negative for intraepithelial lesion or malignancy (NILM)   DIAGPAP  09/24/2020    - Negative for Intraepithelial Lesions or Malignancy (NILM)   DIAGPAP - Benign reactive/reparative changes 09/24/2020   HPV NOT DETECTED 02/19/2018   HPVHIGH Positive (A) 12/29/2022   HPVHIGH Positive (A) 04/27/2022   HPVHIGH Negative 09/24/2020    Cervical History: Previous Abnormal Pap: none Previous Colposcopy: none Previous LEEP or Cryo: none  Smoking: Former Smoker Hysterectomy: No Other History:   Patient given informed consent, signed copy in the chart, time out was performed.    Exam: Vulva and Vagina grossly normal.  Cervix viewed with speculum and colposcope after application of acetic acid:  Cervix Fully Visualized Squamocolumnar Junction Visibility: Fully visualized  Acetowhite lesions: none  Other Lesions: None Punctation: Not present  Mosaicism: Not present Abnormal vasculature: No   Biopsies:  none ECC: Yes - Curette and Brush  Hemostasis achieved with:   n/a  Colposcopy Impression:  Benign   Patient was given post procedure instructions.  Will call patient with results.

## 2023-03-01 ENCOUNTER — Ambulatory Visit: Payer: Medicaid Other

## 2023-03-01 ENCOUNTER — Ambulatory Visit (INDEPENDENT_AMBULATORY_CARE_PROVIDER_SITE_OTHER): Payer: Medicaid Other

## 2023-03-01 VITALS — BP 127/63 | HR 67 | Ht 63.0 in | Wt 150.0 lb

## 2023-03-01 DIAGNOSIS — Z23 Encounter for immunization: Secondary | ICD-10-CM

## 2023-03-01 NOTE — Progress Notes (Signed)
Patient presents for third HPV vaccine. Patient had no immediate reaction. Armandina Stammer RN

## 2023-03-14 ENCOUNTER — Telehealth: Payer: Self-pay | Admitting: Neonatology

## 2023-03-14 NOTE — Telephone Encounter (Signed)
NAS postpartum telephone consult follow-up. Kristy Hunt answered the phone and we were able to discuss her ongoing recovery and adjustment since delivery. She stated that she is currently still taking her daily low-dose Subutex, which she has been on for several years and feels that it is the appropriate dose. She and [name] are doing well and that [name] is meeting all of his appropriate developmental milestones. We discussed the [facility] with [facility] and feel like she would be a great advocate for the clinic. She expressed her interest in attending and possibly achieving her "peer support training." Will call again in 4 weeks and/or prior to discuss [facility] further.   Jason Fila NNP-BC

## 2023-03-23 ENCOUNTER — Encounter: Payer: Self-pay | Admitting: Family Medicine

## 2023-03-23 MED ORDER — BUPRENORPHINE HCL 2 MG SL SUBL: 2.0000 mg | SUBLINGUAL_TABLET | Freq: Every day | SUBLINGUAL | 0 refills | Status: DC

## 2023-05-19 ENCOUNTER — Encounter: Payer: Self-pay | Admitting: Family Medicine

## 2023-05-22 MED ORDER — BUPRENORPHINE HCL 2 MG SL SUBL: 2.0000 mg | SUBLINGUAL_TABLET | Freq: Every day | SUBLINGUAL | 0 refills | Status: DC

## 2023-06-14 ENCOUNTER — Telehealth: Payer: Self-pay

## 2023-06-14 ENCOUNTER — Encounter (HOSPITAL_BASED_OUTPATIENT_CLINIC_OR_DEPARTMENT_OTHER): Payer: Self-pay | Admitting: Emergency Medicine

## 2023-06-14 ENCOUNTER — Other Ambulatory Visit: Payer: Self-pay

## 2023-06-14 ENCOUNTER — Emergency Department (HOSPITAL_BASED_OUTPATIENT_CLINIC_OR_DEPARTMENT_OTHER): Payer: Medicaid Other

## 2023-06-14 ENCOUNTER — Emergency Department (HOSPITAL_BASED_OUTPATIENT_CLINIC_OR_DEPARTMENT_OTHER)
Admission: EM | Admit: 2023-06-14 | Discharge: 2023-06-14 | Disposition: A | Discharge status: Home / Self Care | Payer: Medicaid Other | Attending: Emergency Medicine | Admitting: Emergency Medicine

## 2023-06-14 DIAGNOSIS — R519 Headache, unspecified: Secondary | ICD-10-CM | POA: Diagnosis present

## 2023-06-14 DIAGNOSIS — R030 Elevated blood-pressure reading, without diagnosis of hypertension: Secondary | ICD-10-CM | POA: Diagnosis not present

## 2023-06-14 DIAGNOSIS — R9431 Abnormal electrocardiogram [ECG] [EKG]: Secondary | ICD-10-CM | POA: Insufficient documentation

## 2023-06-14 HISTORY — DX: Unspecified pre-eclampsia, unspecified trimester: O14.90

## 2023-06-14 LAB — CBC WITH DIFFERENTIAL/PLATELET
Abs Immature Granulocytes: 0.02 10*3/uL (ref 0.00–0.07)
Basophils Absolute: 0 10*3/uL (ref 0.0–0.1)
Basophils Relative: 1 %
Eosinophils Absolute: 0 10*3/uL (ref 0.0–0.5)
Eosinophils Relative: 1 %
HCT: 39.5 % (ref 36.0–46.0)
Hemoglobin: 13.1 g/dL (ref 12.0–15.0)
Immature Granulocytes: 1 %
Lymphocytes Relative: 26 %
Lymphs Abs: 1.1 10*3/uL (ref 0.7–4.0)
MCH: 27.4 pg (ref 26.0–34.0)
MCHC: 33.2 g/dL (ref 30.0–36.0)
MCV: 82.6 fL (ref 80.0–100.0)
Monocytes Absolute: 0.3 10*3/uL (ref 0.1–1.0)
Monocytes Relative: 6 %
Neutro Abs: 2.8 10*3/uL (ref 1.7–7.7)
Neutrophils Relative %: 65 %
Platelets: 180 10*3/uL (ref 150–400)
RBC: 4.78 MIL/uL (ref 3.87–5.11)
RDW: 12.1 % (ref 11.5–15.5)
WBC: 4.2 10*3/uL (ref 4.0–10.5)
nRBC: 0 % (ref 0.0–0.2)

## 2023-06-14 LAB — BASIC METABOLIC PANEL
Anion gap: 9 (ref 5–15)
BUN: 18 mg/dL (ref 6–20)
CO2: 26 mmol/L (ref 22–32)
Calcium: 9.3 mg/dL (ref 8.9–10.3)
Chloride: 103 mmol/L (ref 98–111)
Creatinine, Ser: 0.78 mg/dL (ref 0.44–1.00)
GFR, Estimated: >60 mL/min (ref >60)
Glucose, Bld: 98 mg/dL (ref 70–99)
Potassium: 3.7 mmol/L (ref 3.5–5.1)
Sodium: 138 mmol/L (ref 135–145)

## 2023-06-14 LAB — HCG, QUANTITATIVE, PREGNANCY: hCG, Beta Chain, Quant, S: <1 m[IU]/mL (ref <5)

## 2023-06-14 MED ORDER — PROCHLORPERAZINE EDISYLATE 10 MG/2ML IJ SOLN
10.0000 mg | Freq: Once | INTRAMUSCULAR | Status: AC
  Administered 2023-06-14: 10 mg via INTRAVENOUS
  Filled 2023-06-14: qty 2

## 2023-06-14 MED ORDER — IOHEXOL 350 MG/ML SOLN
75.0000 mL | Freq: Once | INTRAVENOUS | Status: AC | PRN
Start: 2023-06-14 — End: 2023-06-14
  Administered 2023-06-14: 75 mL via INTRAVENOUS

## 2023-06-14 MED ORDER — METOCLOPRAMIDE HCL 5 MG/ML IJ SOLN: 10.0000 mg | Freq: Once | INTRAMUSCULAR | Status: DC

## 2023-06-14 MED ORDER — SODIUM CHLORIDE 0.9 % IV BOLUS
1000.0000 mL | Freq: Once | INTRAVENOUS | Status: AC
  Administered 2023-06-14: 1000 mL via INTRAVENOUS

## 2023-06-14 MED ORDER — ACETAMINOPHEN 500 MG PO TABS
1000.0000 mg | ORAL_TABLET | Freq: Once | ORAL | Status: AC
  Administered 2023-06-14: 1000 mg via ORAL

## 2023-06-14 MED ORDER — DIPHENHYDRAMINE HCL 50 MG/ML IJ SOLN: 12.5000 mg | Freq: Once | INTRAMUSCULAR | Status: DC

## 2023-06-14 NOTE — Telephone Encounter (Signed)
-----   Message from Neale Burly sent at 06/14/2023  8:24 AM EST ----- Patient had pre-eclampsia with her last pregnancy. She was on blood pressure medication and she's continued to have her BP spike ever since delivery. Dr. Adrian Blackwater was managing it but she doesn't know what the next steps would need to be. Would like to talk to someone about their symptoms  Best contact number is [phone].

## 2023-06-14 NOTE — Discharge Instructions (Addendum)
You had prolonged QT interval seen on your EKG which is likely due to your Suboxone use.  Your CT venogram was negative for evidence of a blood clot in your brain and there was no other acute abnormality noted and your labs were normal.  Your symptoms are consistent with likely migraine headache.  Follow-up with your PCP, consider outpatient referral to a neurologist if you have recurrent headaches.

## 2023-06-14 NOTE — ED Provider Notes (Signed)
Kristy Hunt   CSN: [id number] Arrival date & time: 06/14/23  0845     History  No chief complaint on file.   Kristy Hunt is a 37 y.o. female.  HPI   37 year old female with medical history significant for preeclampsia requiring hospitalization in April 2024, migraine headaches, opiate use disorder and remission on maintenance therapy presenting to the emergency department with elevated blood pressures and headache.  The patient states that her blood pressure has been up and down over the past couple of months.  She denies any chest pain, shortness of breath.  She took her blood pressure and it was elevated last night.  She developed a headache last night, located in the right side of her head radiating to her occiput, she also endorses a pulsating whooshing sensation in her ear last night.  She denies any upper respiratory infectious symptoms, no recent illnesses, denies any ear pain.  No neck stiffness, no fevers or chills.  She did states she had mild blurry vision last night associated with her headache, no nausea or vomiting.  Headache was not sudden onset or maximal in onset.  It is 6 out of 10 in severity this morning after taking home Motrin prompting her presentation to the emergency department.  She just restarted estrogen-containing birth control pills and is in the postpartum period.  No history of blood clots.  Denies any focal deficits, no vision loss, no double vision, no focal numbness or weakness.  Home Medications Prior to Admission medications   Medication Sig Start Date End Date Taking? Authorizing Provider  VYVANSE 50 MG capsule Take 50 mg by mouth every morning. 05/22/23  Yes [provider]  buprenorphine (SUBUTEX) 2 MG SUBL SL tablet Place 1 tablet (2 mg total) under the tongue daily. 05/22/23   Levie Heritage, DO  drospirenone-ethinyl estradiol (YAZ) 3-0.02 MG tablet Take 1 tablet by mouth  daily. 10/12/22   Levie Heritage, DO      Allergies    Dilaudid [hydromorphone hcl], Hydromorphone hcl, Morphine and codeine, Other, and Epinephrine    Review of Systems   Review of Systems  Neurological:  Positive for headaches.  All other systems reviewed and are negative.   Physical Exam Updated Vital Signs BP (!) 140/86   Pulse 73   Temp 97.8 F (36.6 C) (Oral)   Resp 20   SpO2 98%   Breastfeeding No  Physical Exam Vitals and nursing Hunt reviewed.  Constitutional:      General: She is not in acute distress.    Appearance: She is well-developed.  HENT:     Head: Normocephalic and atraumatic.     Right Ear: Tympanic membrane, ear canal and external ear normal.     Left Ear: Tympanic membrane, ear canal and external ear normal.  Eyes:     Conjunctiva/sclera: Conjunctivae normal.  Cardiovascular:     Rate and Rhythm: Normal rate and regular rhythm.  Pulmonary:     Effort: Pulmonary effort is normal. No respiratory distress.     Breath sounds: Normal breath sounds.  Abdominal:     Palpations: Abdomen is soft.     Tenderness: There is no abdominal tenderness.  Musculoskeletal:        General: No swelling.     Cervical back: Neck supple.  Skin:    General: Skin is warm and dry.     Capillary Refill: Capillary refill takes less than 2 seconds.  Comments: Acneiform rash along the patient's face  Neurological:     Mental Status: She is alert.     Comments: MENTAL STATUS EXAM:    Orientation: Alert and oriented to person, place and time.  Memory: Cooperative, follows commands well.  Language: Speech is clear and language is normal.   CRANIAL NERVES:    CN 2 (Optic): Visual fields intact to confrontation. No visual field deficit CN 3,4,6 (EOM): Pupils equal and reactive to light. Full extraocular eye movement without nystagmus.  CN 5 (Trigeminal): Facial sensation is normal, no weakness of masticatory muscles.  CN 7 (Facial): No facial weakness or asymmetry.   CN 8 (Auditory): Auditory acuity grossly normal.  CN 9,10 (Glossophar): The uvula is midline, the palate elevates symmetrically.  CN 11 (spinal access): Normal sternocleidomastoid and trapezius strength.  CN 12 (Hypoglossal): The tongue is midline. No atrophy or fasciculations.Marland Kitchen   MOTOR:  Muscle Strength: 5/5RUE, 5/5LUE, 5/5RLE, 5/5LLE.   COORDINATION:   Intact finger-to-nose, no tremor.   SENSATION:   Intact to light touch all four extremities.     Psychiatric:        Mood and Affect: Mood normal.     ED Results / Procedures / Treatments   Labs (all labs ordered are listed, but only abnormal results are displayed) Labs Reviewed  CBC WITH DIFFERENTIAL/PLATELET  BASIC METABOLIC PANEL  HCG, QUANTITATIVE, PREGNANCY    EKG EKG Interpretation Date/Time:  Wednesday June 14 2023 09:37:39 EST Ventricular Rate:  75 PR Interval:  142 QRS Duration:  81 QT Interval:  487 QTC Calculation: 544 R Axis:   67  Text Interpretation: Sinus rhythm Ventricular premature complex Probable anterior infarct, age indeterminate Prolonged QT interval Confirmed by Ernie Avena ([id number]) on 06/14/2023 9:41:45 AM  Radiology CT VENOGRAM HEAD  Result Date: 06/14/2023 CLINICAL DATA:  Dural venous sinus thrombosis suspected EXAM: CT VENOGRAM HEAD TECHNIQUE: Venographic phase images of the brain were obtained following the administration of intravenous contrast. Multiplanar reformats and maximum intensity projections were generated. RADIATION DOSE REDUCTION: This exam was performed according to the departmental dose-optimization program which includes automated exposure control, adjustment of the mA and/or kV according to patient size and/or use of iterative reconstruction technique. CONTRAST:  75mL OMNIPAQUE IOHEXOL 350 MG/ML SOLN COMPARISON:  None Available. FINDINGS: No evidence of dural venous sinus thrombosis. Specifically, the superior sagittal, sigmoid, transverse, and straight sinuses are patent.  Symmetric cavernous sinus opacification no visualized deep cerebral veins are patent. Arachnoid granulation at the deep straight sinus. IMPRESSION: No evidence of dural venous sinus thrombosis. Electronically Signed   By: Feliberto Harts M.D.   On: 06/14/2023 10:48    Procedures Procedures    Medications Ordered in ED Medications  acetaminophen (TYLENOL) tablet 1,000 mg (1,000 mg Oral Given 06/14/23 0944)  sodium chloride 0.9 % bolus 1,000 mL (1,000 mLs Intravenous New Bag/Given 06/14/23 7829)  prochlorperazine (COMPAZINE) injection 10 mg (10 mg Intravenous Given 06/14/23 0940)  iohexol (OMNIPAQUE) 350 MG/ML injection 75 mL (75 mLs Intravenous Contrast Given 06/14/23 1034)    ED Course/ Medical Decision Making/ A&P                                 Medical Decision Making Amount and/or Complexity of Data Reviewed Labs: ordered. Radiology: ordered.  Risk OTC drugs. Prescription drug management.    37 year old female with medical history significant for preeclampsia requiring hospitalization in April 2024, migraine headaches, opiate use disorder  and remission on maintenance therapy presenting to the emergency department with elevated blood pressures and headache.  The patient states that her blood pressure has been up and down over the past couple of months.  She denies any chest pain, shortness of breath.  She took her blood pressure and it was elevated last night.  She developed a headache last night, located in the right side of her head radiating to her occiput, she also endorses a pulsating whooshing sensation in her ear last night.  She denies any upper respiratory infectious symptoms, no recent illnesses, denies any ear pain.  No neck stiffness, no fevers or chills.  She did states she had mild blurry vision last night associated with her headache, no nausea or vomiting.  Headache was not sudden onset or maximal in onset.  It is 6 out of 10 in severity this morning after taking home  Motrin prompting her presentation to the emergency department.  She just restarted estrogen-containing birth control pills and is in the postpartum period.  No history of blood clots.  Denies any focal deficits, no vision loss, no double vision, no focal numbness or weakness.  Kristy Hunt is a 37 y.o. female who presents with headache as per above. I have reviewed the nursing documentation for past medical history, family history, and social history. I have reviewed the EMR and have learned that she has a hx of headaches and is on estrogen containing BC and is in the postpartum period.  On arrival, the patient was afebrile, not tachycardic or tachypneic, mildly hypertensive BP 140/86, saturating 98% on room air.  Currently she is awake, alert, GCS 15, HDS, and afebrile. Her exam is most notable for fully intact extraocular motions with bilaterally reactive pupils, no focal neurologic deficits, no meningismus, and no temporal tenderness. There is no rash. The headache was not sudden onset or the worst headache of the patient's life. There is no visual deficit.  I am most concerned for Migrain headache, however also considered cerebral venous thrombosis.  To further evaluate and risk stratify her, labs and imaging were obtained, which were significant for:  Labs: hCG negative, CBC and BMP unremarkable Imaging:  CT Venogram: IMPRESSION:  No evidence of dural venous sinus thrombosis.     I do not think the patient has an aneurysm, intracranial bleed, mass lesion, meningitis, temporal arteritis, stroke, cluster headache, idiopathic intracranial hypertension, cavernous sinus thrombosis, carbon monoxide toxicity, herpes zoster, carotid or vertebral artery dissection, or acute angle close glaucoma.  On reassessment, the patient remained well appearing and was again able to ambulate without difficulty and did not have any focal neurologic deficits.  I believe the patient is stable for DC.  We  participated in shared decision making regarding continued observation in the ED versus discharge home for continued recovery after receiving the below medications. She preferred to recover at home. I believe that this is safe and reasonable.  We have discussed the diagnosis and risks, and we agree with discharging home to follow-up with their primary doctor. We also discussed returning to the Emergency Department immediately if new or worsening symptoms occur. We have discussed the symptoms which are most concerning (e.g., changing or worsening pain, weakness, vomiting, fever, or abnormal sensation) that necessitate immediate return. I provided ED return precautions. The patient felt safe with this plan.  ED Medication Summary: Medications  acetaminophen (TYLENOL) tablet 1,000 mg (1,000 mg Oral Given 06/14/23 0944)  sodium chloride 0.9 % bolus 1,000 mL (1,000 mLs  Intravenous New Bag/Given 06/14/23 0938)  prochlorperazine (COMPAZINE) injection 10 mg (10 mg Intravenous Given 06/14/23 0940)  iohexol (OMNIPAQUE) 350 MG/ML injection 75 mL (75 mLs Intravenous Contrast Given 06/14/23 1034)      Final Clinical Impression(s) / ED Diagnoses Final diagnoses:  Acute nonintractable headache, unspecified headache type  Prolonged Q-T interval on ECG  Elevated blood pressure reading    Rx / DC Orders ED Discharge Orders     None         Ernie Avena, MD 06/14/23 1052

## 2023-06-14 NOTE — Telephone Encounter (Signed)
Called patient back. Left message for patient to call the office back. Sybol Morre l Ikea Demicco, CMA

## 2023-06-14 NOTE — ED Triage Notes (Signed)
Pt had  eclampsia after having her baby 7 weeks early. She was readmitted for this a week after. Pt states past couple months she says her bp has been up and down. Pt also having headaches (she states not a normal headache). Felt like her right ear was pulsating last night.

## 2023-06-14 NOTE — ED Notes (Signed)
Transport to CT

## 2023-07-25 ENCOUNTER — Other Ambulatory Visit: Payer: Self-pay | Admitting: Family Medicine

## 2023-07-26 ENCOUNTER — Other Ambulatory Visit: Payer: Self-pay | Admitting: Family Medicine

## 2023-07-26 MED ORDER — BUPRENORPHINE HCL 2 MG SL SUBL: 2.0000 mg | SUBLINGUAL_TABLET | Freq: Every day | SUBLINGUAL | 0 refills | Status: AC

## 2023-10-04 ENCOUNTER — Ambulatory Visit: Admitting: Family Medicine

## 2023-10-04 VITALS — BP 144/78 | HR 82 | Ht 63.0 in | Wt 129.0 lb

## 2023-10-04 DIAGNOSIS — L659 Nonscarring hair loss, unspecified: Secondary | ICD-10-CM

## 2023-10-04 DIAGNOSIS — F3281 Premenstrual dysphoric disorder: Secondary | ICD-10-CM

## 2023-10-04 NOTE — Progress Notes (Signed)
   Subjective:    Patient ID: Kristy Hunt, female    DOB: June 23, 1986, 38 y.o.   MRN: [id number]  HPI  Patient seen for concerns of PMDD.  She has been noticing get headaches about a week prior to menses.  In addition, she has a fair amount of facial acne around the same time.  This happens regardless of whether she is on COC's. She is questioning whether her hormones are normal. Finds that her mood is worse during this time period as well. Is finding that she is having some hair loss at the front of her hair line.   Review of Systems     Objective:   Physical Exam Vitals reviewed.  Constitutional:      Appearance: Normal appearance.  Cardiovascular:     Rate and Rhythm: Normal rate and regular rhythm.  Pulmonary:     Effort: Pulmonary effort is normal.     Breath sounds: Normal breath sounds.  Skin:    Capillary Refill: Capillary refill takes less than 2 seconds.  Neurological:     General: No focal deficit present.     Mental Status: She is alert.  Psychiatric:        Mood and Affect: Mood normal.        Behavior: Behavior normal.        Thought Content: Thought content normal.        Judgment: Judgment normal.       Assessment & Plan:  1. PMDD (premenstrual dysphoric disorder) (Primary) Will look at hormone studies. Discussed trying a different COC. She would like to wait until hormone studies are back to discuss options. She could try continuous COC to try to avoid hormone drop. - Estrogens, Total - FSH - TestT+TestF+SHBG - TSH Rfx on Abnormal to Free T4 - HgB A1c  2. Hair loss - Estrogens, Total - FSH - TestT+TestF+SHBG - TSH Rfx on Abnormal to Free T4 - HgB A1c

## 2023-10-09 LAB — TSH RFX ON ABNORMAL TO FREE T4: TSH: 0.867 u[IU]/mL (ref 0.450–4.500)

## 2023-10-09 LAB — ESTROGENS, TOTAL: Estrogen: 128 pg/mL

## 2023-10-09 LAB — HEMOGLOBIN A1C
Est. average glucose Bld gHb Est-mCnc: 111 mg/dL
Hgb A1c MFr Bld: 5.5 % (ref 4.8–5.6)

## 2023-10-09 LAB — TESTT+TESTF+SHBG
Sex Hormone Binding: 65.2 nmol/L (ref 24.6–122.0)
Testosterone, Free: 0.3 pg/mL (ref 0.0–4.2)
Testosterone, Total, LC/MS: 18.7 ng/dL (ref 10.0–55.0)

## 2023-10-09 LAB — FOLLICLE STIMULATING HORMONE: FSH: 4.5 m[IU]/mL

## 2023-10-11 ENCOUNTER — Encounter: Payer: Self-pay | Admitting: Family Medicine

## 2023-12-24 ENCOUNTER — Other Ambulatory Visit: Payer: Self-pay | Admitting: Family Medicine

## 2024-03-04 ENCOUNTER — Encounter: Payer: Self-pay | Admitting: Family Medicine

## 2024-03-04 MED ORDER — NORETHIN ACE-ETH ESTRAD-FE 1-20 MG-MCG(24) PO TABS: 1.0000 | ORAL_TABLET | Freq: Every day | ORAL | 3 refills | Status: DC

## 2024-04-25 ENCOUNTER — Ambulatory Visit: Admitting: Family Medicine

## 2024-04-25 VITALS — BP 112/56 | HR 73 | Wt 135.1 lb

## 2024-04-25 DIAGNOSIS — N920 Excessive and frequent menstruation with regular cycle: Secondary | ICD-10-CM

## 2024-04-25 DIAGNOSIS — N951 Menopausal and female climacteric states: Secondary | ICD-10-CM

## 2024-04-25 DIAGNOSIS — F1111 Opioid abuse, in remission: Secondary | ICD-10-CM | POA: Diagnosis not present

## 2024-04-25 NOTE — Progress Notes (Signed)
 Acute Office Visit  Subjective:     Patient ID: Kristy Hunt, female    DOB: 1985-10-03, 38 y.o.   MRN: [id number]  No chief complaint on file.   HPI  Discussed the use of AI scribe software for clinical note transcription with the patient, who gave verbal consent to proceed.  History of Present Illness Kristy Hunt is a 38 year old female who presents with concerns of hormonal imbalance and related symptoms.  She experiences symptoms she attributes to hormonal imbalances, including the appearance of random black hairs along her hairline and significant acne breakouts. Minocycline has been effective in clearing her acne, but she developed folliculitis after plucking the hairs.  She has not started a new birth control due to a previous suggestion of PMDD. Her mood fluctuates with increased sensitivity and emotional changes, particularly premenstrually. She has tried taking famotidine  and Zyrtec together based on online information, but not consistently enough to determine its effectiveness.  She experiences sleep disturbances, waking frequently at night, despite her child sleeping through the night. She is currently taking Subutex  and has ongoing knee issues, with screws that have surfaced, but she cannot afford the downtime for surgery. She has a history of postpartum depression and seasonal depression, particularly in October due to past trauma.  Her menstrual cycles have been mostly regular, though her last period was early and accompanied by significant clotting. No night sweats, hot flashes, or vaginal dryness. She has a family history of endometriosis, with her mother having had a total hysterectomy at age 31 due to severe periods.  She is currently not on any mental health medications and has undergone genetic testing in the past. She wants more targeted hormonal testing to better understand her symptoms.     ROS Per HPI      Objective:    BP (!) 112/56 (BP  Location: Left Arm, Patient Position: Sitting, Cuff Size: Normal)   Pulse 73   Wt 135 lb 1.3 oz (61.3 kg)   LMP 04/01/2024 (Exact Date)   BMI 23.93 kg/m    Physical Exam Vitals reviewed.  Constitutional:      Appearance: Normal appearance.  Cardiovascular:     Rate and Rhythm: Normal rate and regular rhythm.  Pulmonary:     Effort: Pulmonary effort is normal.     Breath sounds: Normal breath sounds.  Abdominal:     General: Abdomen is flat.     Palpations: Abdomen is soft.     Tenderness: There is no abdominal tenderness. There is no guarding.  Skin:    Capillary Refill: Capillary refill takes less than 2 seconds.  Neurological:     General: No focal deficit present.     Mental Status: She is alert.  Psychiatric:        Mood and Affect: Mood normal.        Behavior: Behavior normal.        Thought Content: Thought content normal.        Judgment: Judgment normal.     No results found for any visits on 04/25/24.      Assessment & Plan:   1. Perimenopause (Primary) Suspected hormonal imbalance post-pregnancy causing abnormal hair growth, acne, and folliculitis. Differential includes PCOS and thyroid dysfunction. - Order thyroid function tests and hormonal panel including estrogen, progesterone, follicle-stimulating hormone, and testosterone. - Consider referral to endocrinologist if hormonal imbalance is confirmed. - TSH Rfx on Abnormal to Free T4 - Hemoglobin A1c - TestT+TestF+SHBG - Follicle  stimulating hormone - DHEA-sulfate - Luteinizing hormone - Estrogens , Total - Progesterone  2. Menorrhagia with regular cycle with PMDD Mood swings, sensitivity, and acne flare-ups suggest PMDD. Hormone replacement therapy discussed as an alternative to birth control. - Discuss potential benefits of hormone replacement therapy instead of birth control. - Consider alternative treatments for PMDD if hormone replacement is not effective. - TSH Rfx on Abnormal to Free T4 -  Hemoglobin A1c - TestT+TestF+SHBG - Follicle stimulating hormone - DHEA-sulfate - Luteinizing hormone - Estrogens , Total - Progesterone   3. Opioid use disorder, mild, in sustained remission, on maintenance therapy (HCC) Opioid dependence well-managed on Subutex . Continuation planned until knee issues are resolved. - Continue current dose of Subutex .  Right knee pain, status post hardware placement Right knee pain due to hardware placement with surfaced screws. Surgery not feasible currently. - Plan for knee surgery when feasible. - Manage pain with current medications until surgery is possible.     Orders Placed This Encounter  Procedures   TSH Rfx on Abnormal to Free T4   Hemoglobin A1c   TestT+TestF+SHBG   Follicle stimulating hormone   DHEA-sulfate   Luteinizing hormone   Estrogens , Total   Progesterone     No orders of the defined types were placed in this encounter.   No follow-ups on file.  Emet Rafanan J Callen Zuba, DO

## 2024-05-01 LAB — LUTEINIZING HORMONE: LH: 3.5 m[IU]/mL

## 2024-05-01 LAB — TESTT+TESTF+SHBG
Sex Hormone Binding: 99.3 nmol/L (ref 24.6–122.0)
Testosterone, Free: 0.2 pg/mL (ref 0.0–4.2)
Testosterone, Total, LC/MS: 30 ng/dL (ref 10.0–55.0)

## 2024-05-01 LAB — ESTROGENS, TOTAL: Estrogen: 350 pg/mL

## 2024-05-01 LAB — TSH RFX ON ABNORMAL TO FREE T4: TSH: 0.944 u[IU]/mL (ref 0.450–4.500)

## 2024-05-01 LAB — DHEA-SULFATE: DHEA-SO4: 117 ug/dL (ref 57.3–279.2)

## 2024-05-01 LAB — HEMOGLOBIN A1C
Est. average glucose Bld gHb Est-mCnc: 103 mg/dL
Hgb A1c MFr Bld: 5.2 % (ref 4.8–5.6)

## 2024-05-01 LAB — FOLLICLE STIMULATING HORMONE: FSH: 3 m[IU]/mL

## 2024-05-01 LAB — PROGESTERONE: Progesterone: 5.4 ng/mL

## 2024-05-03 ENCOUNTER — Encounter: Payer: Self-pay | Admitting: Family Medicine

## 2024-05-10 ENCOUNTER — Other Ambulatory Visit: Payer: Self-pay | Admitting: Family Medicine

## 2024-05-10 MED ORDER — MINOCYCLINE HCL 75 MG PO TABS: 75.0000 mg | ORAL_TABLET | Freq: Every day | ORAL | 0 refills | Status: DC

## 2024-05-10 NOTE — Addendum Note (Signed)
 Addended by: BARBRA LANG PARAS on: 05/10/2024 02:35 PM   Modules accepted: Orders

## 2024-06-17 NOTE — Telephone Encounter (Signed)
  Support Services: When patient returns call, please complete the following:      Message  Video verification

## 2024-06-19 ENCOUNTER — Emergency Department (HOSPITAL_BASED_OUTPATIENT_CLINIC_OR_DEPARTMENT_OTHER)
Admission: EM | Admit: 2024-06-19 | Discharge: 2024-06-19 | Disposition: A | Discharge status: Home / Self Care | Attending: Emergency Medicine | Admitting: Emergency Medicine

## 2024-06-19 ENCOUNTER — Encounter (HOSPITAL_BASED_OUTPATIENT_CLINIC_OR_DEPARTMENT_OTHER): Payer: Self-pay

## 2024-06-19 ENCOUNTER — Emergency Department (HOSPITAL_BASED_OUTPATIENT_CLINIC_OR_DEPARTMENT_OTHER)

## 2024-06-19 ENCOUNTER — Ambulatory Visit: Payer: Self-pay | Admitting: *Deleted

## 2024-06-19 ENCOUNTER — Other Ambulatory Visit: Payer: Self-pay

## 2024-06-19 DIAGNOSIS — N83202 Unspecified ovarian cyst, left side: Secondary | ICD-10-CM | POA: Insufficient documentation

## 2024-06-19 DIAGNOSIS — R109 Unspecified abdominal pain: Secondary | ICD-10-CM

## 2024-06-19 LAB — CBC
HCT: 40.8 % (ref 36.0–46.0)
Hemoglobin: 13.9 g/dL (ref 12.0–15.0)
MCH: 28.3 pg (ref 26.0–34.0)
MCHC: 34.1 g/dL (ref 30.0–36.0)
MCV: 83.1 fL (ref 80.0–100.0)
Platelets: 198 K/uL (ref 150–400)
RBC: 4.91 MIL/uL (ref 3.87–5.11)
RDW: 12.2 % (ref 11.5–15.5)
WBC: 5.1 K/uL (ref 4.0–10.5)
nRBC: 0 % (ref 0.0–0.2)

## 2024-06-19 LAB — PREGNANCY, URINE: Preg Test, Ur: NEGATIVE

## 2024-06-19 LAB — URINALYSIS, ROUTINE W REFLEX MICROSCOPIC
Bacteria, UA: NONE SEEN
Bilirubin Urine: NEGATIVE
Glucose, UA: NEGATIVE mg/dL
Hgb urine dipstick: NEGATIVE
Ketones, ur: NEGATIVE mg/dL
Nitrite: NEGATIVE
Protein, ur: 30 mg/dL — AB
Specific Gravity, Urine: 1.032 — ABNORMAL HIGH (ref 1.005–1.030)
Squamous Epithelial / HPF: >50 /HPF (ref 0–5)
pH: 6 (ref 5.0–8.0)

## 2024-06-19 LAB — COMPREHENSIVE METABOLIC PANEL WITH GFR
ALT: 10 U/L (ref 0–44)
AST: 15 U/L (ref 15–41)
Albumin: 4.4 g/dL (ref 3.5–5.0)
Alkaline Phosphatase: 62 U/L (ref 38–126)
Anion gap: 12 (ref 5–15)
BUN: 16 mg/dL (ref 6–20)
CO2: 22 mmol/L (ref 22–32)
Calcium: 9.7 mg/dL (ref 8.9–10.3)
Chloride: 102 mmol/L (ref 98–111)
Creatinine, Ser: 0.7 mg/dL (ref 0.44–1.00)
GFR, Estimated: >60 mL/min (ref >60)
Glucose, Bld: 86 mg/dL (ref 70–99)
Potassium: 4.2 mmol/L (ref 3.5–5.1)
Sodium: 136 mmol/L (ref 135–145)
Total Bilirubin: 0.6 mg/dL (ref 0.0–1.2)
Total Protein: 7 g/dL (ref 6.5–8.1)

## 2024-06-19 LAB — LIPASE, BLOOD: Lipase: 26 U/L (ref 11–51)

## 2024-06-19 MED ORDER — KETOROLAC TROMETHAMINE 15 MG/ML IJ SOLN
15.0000 mg | Freq: Once | INTRAMUSCULAR | Status: AC
  Administered 2024-06-19: 15 mg via INTRAVENOUS
  Filled 2024-06-19: qty 1

## 2024-06-19 MED ORDER — FENTANYL CITRATE (PF) 50 MCG/ML IJ SOSY: 25.0000 ug | PREFILLED_SYRINGE | Freq: Once | INTRAMUSCULAR | Status: DC

## 2024-06-19 MED ORDER — IOHEXOL 300 MG/ML  SOLN
100.0000 mL | Freq: Once | INTRAMUSCULAR | Status: AC | PRN
  Administered 2024-06-19: 100 mL via INTRAVENOUS

## 2024-06-19 NOTE — Telephone Encounter (Signed)
 FYI Only or Action Required?: FYI only for provider: ED advised.  Patient was last seen in primary care on no PCP listed.  Called Nurse Triage reporting Abdominal Pain.  Symptoms began yesterday.    Symptoms are: gradually worsening.  Triage Disposition: Go to ED Now (Notify PCP)  Patient/caregiver understands and will follow disposition?: Yes  Copied from CRM [id number]. Topic: Clinical - Red Word Triage >> Jun 19, 2024  3:28 PM Rosaria BRAVO wrote: Red Word that prompted transfer to Nurse Triage: Abdominal pain, right side. Can't bend over, sharp pains all the way through her back and stomach. Reason for Disposition . [1] SEVERE pain (e.g., excruciating) AND [2] present > 1 hour  Answer Assessment - Initial Assessment Questions 1. LOCATION: Where does it hurt?      LRQ 2. RADIATION: Does the pain shoot anywhere else? (e.g., chest, back)     Into the side 3. ONSET: When did the pain begin? (e.g., minutes, hours or days ago)      Yesterday pm- continued today- sharp pain 4. SUDDEN: Gradual or sudden onset?     sudden 5. PATTERN Does the pain come and go, or is it constant?     Constant- intensity changes 6. SEVERITY: How bad is the pain?  (e.g., Scale 1-10; mild, moderate, or severe)     8-9/10 7. RECURRENT SYMPTOM: Have you ever had this type of stomach pain before? If Yes, ask: When was the last time? and What happened that time?      Hx- several abdominal issues: IC, gallbladder removed- but nothing has ever felt like this 8. CAUSE: What do you think is causing the stomach pain? (e.g., gallstones, recent abdominal surgery)     unsure 9. RELIEVING/AGGRAVATING FACTORS: What makes it better or worse? (e.g., antacids, bending or twisting motion, bowel movement)     Positional as to intensity 10. OTHER SYMPTOMS: Do you have any other symptoms? (e.g., back pain, diarrhea, fever, urination pain, vomiting)       no 11. PREGNANCY: Is there any chance you are  pregnant? When was your last menstrual period?       No  Protocols used: Abdominal Pain - Female-A-AH

## 2024-06-19 NOTE — Discharge Instructions (Signed)
 You were seen in the emergency department today for concerns of abdominal pain.  You did not have any findings to suggest a kidney stone or ovarian issues on the right side.  It does appear that you have a cyst on the left ovary which may be causing your current symptoms.  Take high-dose anti-inflammatory such as ibuprofen  or naproxen.  Follow-up closely with your OB/GYN.  For any concerns or worsening symptoms, return to the emergency department.

## 2024-06-19 NOTE — ED Notes (Signed)
 Pt d/c instructions, medications, and follow-up care reviewed with pt. Pt verbalized understanding and had no further questions at time of d/c. Pt CA&Ox4, ambulatory, and in NAD at time of d/c

## 2024-06-19 NOTE — ED Provider Notes (Cosign Needed Addendum)
 [place] [facility]ERGENCY DEPARTMENT AT [facility] Provider Note   CSN: [id number] Arrival date & time: 06/19/24  1609     Patient presents with: Flank Pain   Kristy Hunt is a 38 y.o. female.  Patient with past history significant for migraines, preeclampsia, sacral fracture presents the emergency department with concerns of flank pain.  States that she has been dealing with right-sided flank pain that has been migrating over the last several days.  States pain is worse and is now more present to the right lower quadrant.  Denies any nausea, vomiting, or diarrhea.  No reported urinary symptoms.  Denies gross hematuria.  No history of kidney stones.   Flank Pain       Prior to Admission medications   Medication Sig Start Date End Date Taking? Authorizing Provider  buprenorphine  (SUBUTEX ) 2 MG SUBL SL tablet Place 1 tablet (2 mg total) under the tongue daily. 07/26/23   Stinson, Jacob J, DO  Norethindrone Acetate-Ethinyl Estrad-FE (LOESTRIN 24 FE) 1-20 MG-MCG(24) tablet Take 1 tablet by mouth daily. Patient not taking: Reported on 04/25/2024 03/04/24   Stinson, Jacob J, DO  VYVANSE 50 MG capsule Take 50 mg by mouth every morning. 05/22/23   [provider]    Allergies: Dilaudid [hydromorphone hcl], Hydromorphone hcl, Morphine and codeine, Other, and Epinephrine    Review of Systems  Genitourinary:  Positive for flank pain.  All other systems reviewed and are negative.   Updated Vital Signs BP 116/70   Pulse 68   Temp 98 F (36.7 C)   Resp 18   Ht 5' 3 (1.6 m)   Wt 61.2 kg   LMP 05/29/2024 (Exact Date)   SpO2 99%   BMI 23.91 kg/m   Physical Exam Vitals and nursing note reviewed.  Constitutional:      General: She is not in acute distress.    Appearance: She is well-developed.  HENT:     Head: Normocephalic and atraumatic.  Eyes:     Conjunctiva/sclera: Conjunctivae normal.  Cardiovascular:     Rate and Rhythm: Normal rate and regular rhythm.      Heart sounds: No murmur heard. Pulmonary:     Effort: Pulmonary effort is normal. No respiratory distress.     Breath sounds: Normal breath sounds.  Abdominal:     General: Abdomen is flat. Bowel sounds are normal. There is no distension.     Palpations: Abdomen is soft.     Tenderness: There is abdominal tenderness. There is right CVA tenderness. There is no left CVA tenderness or guarding.     Comments: TTP along the right mid abdomen into the RLQ.  Musculoskeletal:        General: No swelling.     Cervical back: Neck supple.  Skin:    General: Skin is warm and dry.     Capillary Refill: Capillary refill takes less than 2 seconds.  Neurological:     Mental Status: She is alert.  Psychiatric:        Mood and Affect: Mood normal.     (all labs ordered are listed, but only abnormal results are displayed) Labs Reviewed  URINALYSIS, ROUTINE W REFLEX MICROSCOPIC - Abnormal; Notable for the following components:      Result Value   APPearance HAZY (*)    Specific Gravity, Urine 1.032 (*)    Protein, ur 30 (*)    Leukocytes,Ua MODERATE (*)    All other components within normal limits  LIPASE, BLOOD  COMPREHENSIVE METABOLIC PANEL WITH GFR  CBC  PREGNANCY, URINE    EKG: None  Radiology: US  PELVIC COMPLETE W TRANSVAGINAL AND TORSION R/O Result Date: 06/19/2024 CLINICAL DATA:  Right flank pain and right lower quadrant pain x3 days. EXAM: TRANSABDOMINAL AND TRANSVAGINAL ULTRASOUND OF PELVIS DOPPLER ULTRASOUND OF OVARIES TECHNIQUE: Both transabdominal and transvaginal ultrasound examinations of the pelvis were performed. Transabdominal technique was performed for global imaging of the pelvis including uterus, ovaries, adnexal regions, and pelvic cul-de-sac. It was necessary to proceed with endovaginal exam following the transabdominal exam to visualize the uterus, endometrium, bilateral ovaries and bilateral adnexa. Color and duplex Doppler ultrasound was utilized to evaluate blood  flow to the ovaries. COMPARISON:  None Available. FINDINGS: Uterus Measurements: 10.0 cm x 5.7 cm x 6.8 cm = volume: 202.3 mL. No fibroids or other mass visualized. Endometrium Thickness: 12.4 mm.  No focal abnormality visualized. Right ovary Measurements: 2.6 cm x 2.3 cm x 2.4 cm = volume:  7.2 mL. Normal appearance/no adnexal mass. Doppler: There is normal vascularity on color doppler examination. Spectral doppler arterial and venous waveforms are normal. Left ovary Measurements: 4.6 cm x 2.2 cm x 2.6 cm = volume: 13.7 mL. A 1.4 cm left corpus luteum cyst is suspected. Doppler: There is normal vascularity on color doppler examination. Spectral doppler arterial and venous waveforms are normal. Other findings No abnormal free fluid. IMPRESSION: 1. Findings likely consistent with a left corpus luteum cyst. Correlation with 6-12 week follow-up pelvic ultrasound is recommended to determine stability. 2. Thickened endometrium which may represent sequelae associated with the phase of the patient's current menstrual cycle. Electronically Signed   By: Suzen Dials M.D.   On: 06/19/2024 22:07   CT ABDOMEN PELVIS W CONTRAST Result Date: 06/19/2024 EXAM: CT ABDOMEN AND PELVIS WITH CONTRAST 06/19/2024 06:53:31 PM TECHNIQUE: CT of the abdomen and pelvis was performed with the administration of 100 mL of iohexol  (OMNIPAQUE ) 300 MG/ML solution. Multiplanar reformatted images are provided for review. Automated exposure control, iterative reconstruction, and/or weight-based adjustment of the mA/kV was utilized to reduce the radiation dose to as low as reasonably achievable. COMPARISON: Comparison with 03/08/2028. CLINICAL HISTORY: RLQ abdominal pain. FINDINGS: LOWER CHEST: No acute abnormality. LIVER: The liver is unremarkable. GALLBLADDER AND BILE DUCTS: Cholecystectomy. No biliary ductal dilatation. SPLEEN: No acute abnormality. PANCREAS: No acute abnormality. ADRENAL GLANDS: No acute abnormality. KIDNEYS, URETERS AND  BLADDER: Punctate nonobstructing right nephrolithiasis. No stones in the left kidney or ureters. No hydronephrosis. No perinephric or periureteral stranding. Urinary bladder is unremarkable. GI AND BOWEL: Stomach demonstrates no acute abnormality. Normal appendix. There is no bowel obstruction. PERITONEUM AND RETROPERITONEUM: No ascites. No free air. VASCULATURE: Aorta is normal in caliber. LYMPH NODES: No lymphadenopathy. REPRODUCTIVE ORGANS: No acute abnormality. BONES AND SOFT TISSUES: No acute osseous abnormality. No focal soft tissue abnormality. IMPRESSION: 1. No acute findings in the abdomen or pelvis. 2. Punctate nonobstructing right nephrolithiasis. Electronically signed by: Norman Gatlin MD 06/19/2024 08:07 PM EST RP Workstation: [id number]     Procedures   Medications Ordered in the ED  fentaNYL  (SUBLIMAZE ) injection 25 mcg (has no administration in time range)  ketorolac  (TORADOL ) 15 MG/ML injection 15 mg (15 mg Intravenous Given 06/19/24 1712)  iohexol  (OMNIPAQUE ) 300 MG/ML solution 100 mL (100 mLs Intravenous Contrast Given 06/19/24 1846)  ketorolac  (TORADOL ) 15 MG/ML injection 15 mg (15 mg Intravenous Given 06/19/24 2035)  Medical Decision Making Amount and/or Complexity of Data Reviewed Labs: ordered. Radiology: ordered.  Risk Prescription drug management.   This patient presents to the ED for concern of flank pain, this involves an extensive number of treatment options, and is a complaint that carries with it a high risk of complications and morbidity.  The differential diagnosis includes UTI, pyelonephritis, urolithiasis, ovarian torsion, diverticulitis   Co morbidities that complicate the patient evaluation  Migraines, preeclampsia, sacral fracture   Additional history obtained:  Additional history obtained from chart review   Lab Tests:  I Ordered, and personally interpreted labs.  The pertinent results include: CBC  unremarkable, CMP unremarkable, UA without other signs of infection but some leukocytes are seen and urine is hazy, lipase unremarkable, urine pregnancy negative   Imaging Studies ordered:  I ordered imaging studies including CT abdomen pelvis, pelvic ultrasound I independently visualized and interpreted imaging which showed No acute findings in the abdomen or pelvis. 2. Punctate nonobstructing right nephrolithiasis. Findings likely consistent with a left corpus luteum cyst. Correlation with 6-12 week follow-up pelvic ultrasound is recommended to determine stability. 2. Thickened endometrium which may represent sequelae associated with the phase of the patient's current menstrual cycle. I agree with the radiologist interpretation   Consultations Obtained:  I requested consultation with none,  and discussed lab and imaging findings as well as pertinent plan - they recommend: N/A   Problem List / ED Course / Critical interventions / Medication management  Patient with past history significant for migraines, preeclampsia, sacral fracture presents to ED with concerns of flank pain.  Reports several days of right-sided flank pain that has progressed and migrated now more focal to the right mid abdomen/right lower quadrant.  No nausea, vomiting, or diarrhea.  Denies any fever, chills or bodyaches.  Has been unable to get this pain under control at home.  No history of similar symptoms.  Does report a prior cholecystectomy. Physical exam reveals point tenderness to the right mid and right lower quadrant.  Some mild right CVA tenderness is present.  Left side of the abdomen unremarkable.  GU exam deferred. Lab workup is unremarkable.  UA without obvious blood present and some leukocytes seen but no bacteria.  Doubtful of UTI.  Lipase normal making pancreatitis unlikely.  Urine pregnancy negative.  CT on pelvis currently pending.  Patient given a dose of Toradol  for pain control. CT ab pelvis is largely  reassuring and no significant findings to explain her current symptoms.  There there is a right sided nonobstructing nephrolithiasis.  Likely not cause of her symptoms.  After careful discussion with patient, added on pelvic ultrasound to assess for any ovarian abnormalities causing current symptoms. Ultrasound negative for concerns of torsion, abscess, but does appear to show a left corpus luteum cyst.  Unclear if this may be referred pain that patient is currently experiencing but this is only focal abnormality seen on imaging.  Advised use of high-dose anti-inflammatory medications at home such as ibuprofen  and naproxen.  Encourage close follow-up with her OB/GYN for repeat imaging.  She is otherwise stable for outpatient follow-up and discharged home. I ordered medication including Toradol  for pain Reevaluation of the patient after these medicines showed that the patient improved I have reviewed the patients home medicines and have made adjustments as needed  Social Determinants of Health:  None   Test / Admission - Considered:  Final disposition pending results of pelvic ultrasound.  Final diagnoses:  Right sided abdominal pain  Left ovarian  cyst    ED Discharge Orders     None          Cecily Legrand LABOR, PA-C 06/19/24 2205    Cecily Legrand LABOR, PA-C 06/19/24 2224    Towana Ozell BROCKS, MD 06/20/24 1019

## 2024-06-19 NOTE — ED Triage Notes (Signed)
 Pt POV reporting R flank pain past few days, worsened today. Denies dysuria, endorses slight nausea.

## 2024-08-08 ENCOUNTER — Ambulatory Visit: Admitting: Family Medicine

## 2024-08-08 VITALS — BP 130/72 | HR 86 | Wt 136.1 lb

## 2024-08-08 DIAGNOSIS — R10A1 Flank pain, right side: Secondary | ICD-10-CM

## 2024-08-08 DIAGNOSIS — L680 Hirsutism: Secondary | ICD-10-CM

## 2024-08-08 MED ORDER — DROSPIRENONE-ETHINYL ESTRADIOL 3-0.02 MG PO TABS: 1.0000 | ORAL_TABLET | Freq: Every day | ORAL | 11 refills | Status: AC

## 2024-08-08 NOTE — Progress Notes (Signed)
 "  Acute Office Visit  Subjective:     Patient ID: Kristy Hunt, female    DOB: 12-Aug-1985, 39 y.o.   MRN: [id number]  No chief complaint on file.   HPI  Discussed the use of AI scribe software for clinical note transcription with the patient, who gave verbal consent to proceed.  History of Present Illness Kristy Hunt is a 39 year old female who presents with right-sided abdominal pain and concerns about ovarian cysts and hormonal imbalances.  She experiences sharp, stabbing pains on her right side, severe enough to interfere with her work duties, such as bending over and lifting. She suspects passing a small kidney stone, as she noticed something small in her urine and experienced significant pain, although imaging did not reveal any active stones.  An ultrasound revealed a cyst on her left ovary. Despite this, she experiences significant pain on the right side. Her symptoms include irregular periods, lactation from one breast, and the growth of coarse black hair on her forehead.  She recently restarted birth control (Nikki ) three weeks ago, hoping it might help with her symptoms. She also takes vitamins such as zinc, magnesium , and saw palmetto, which she read might help with hormonal issues. She experiences anxiety and is sensitive to certain foods and drinks, which can cause nausea.  She has a family history of ovarian issues, with her mother having had a total hysterectomy at 61 due to headaches, ovarian cysts, and endometriosis, and her aunt having had an ovary rupture. No hot flashes or night sweats, but she reports feeling hot and cold randomly. She has a history of abnormal Pap smears.     ROS Per HPI      Objective:    BP 130/72 (BP Location: Left Arm, Patient Position: Sitting, Cuff Size: Normal)   Pulse 86   Wt 136 lb 1.3 oz (61.7 kg)   LMP 07/30/2024 (Exact Date)   Breastfeeding No   BMI 24.11 kg/m    Physical Exam Constitutional:      Appearance:  Normal appearance.  Cardiovascular:     Rate and Rhythm: Normal rate and regular rhythm.  Pulmonary:     Effort: Pulmonary effort is normal.     Breath sounds: Normal breath sounds.  Abdominal:     General: Abdomen is flat.     Palpations: Abdomen is soft.  Neurological:     General: No focal deficit present.     Mental Status: She is alert.  Psychiatric:        Mood and Affect: Mood normal.        Behavior: Behavior normal.        Thought Content: Thought content normal.        Judgment: Judgment normal.     No results found for any visits on 08/08/24.      Assessment & Plan:   Assessment and Plan Assessment & Plan Hirsutism and abnormal uterine bleeding Hirsutism with coarse hair growth and abnormal uterine bleeding suggest possible hormonal imbalance, potentially related to PCOS. Birth control may reduce hirsutism if hormone-related. - Continue birth control for three months to assess improvement. - Consider hormone therapy with estrogen patch and oral adjustment if ineffective. - Consider finasteride if ineffective, with caution regarding side effects and need for continued birth control. - Schedule follow-up in three months to evaluate treatment efficacy.  Right flank pain, likely resolved nephrolithiasis Right flank pain likely due to resolved nephrolithiasis. No active stones on imaging, pain improved. -  No further imaging unless new symptoms develop.       No orders of the defined types were placed in this encounter.    Meds ordered this encounter  Medications   drospirenone -ethinyl estradiol  (NIKKI ) 3-0.02 MG tablet    Sig: Take 1 tablet by mouth daily.    Dispense:  28 tablet    Refill:  11    No follow-ups on file.  Avante Carneiro J Jakaylah Schlafer, DO  "

## 2024-08-14 ENCOUNTER — Encounter: Payer: Self-pay | Admitting: Family Medicine

## 2024-08-15 ENCOUNTER — Ambulatory Visit: Admitting: Family Medicine

## 2024-08-15 VITALS — BP 107/62 | HR 72 | Wt 138.0 lb

## 2024-08-15 DIAGNOSIS — Z113 Encounter for screening for infections with a predominantly sexual mode of transmission: Secondary | ICD-10-CM

## 2024-08-15 DIAGNOSIS — B009 Herpesviral infection, unspecified: Secondary | ICD-10-CM

## 2024-08-15 MED ORDER — VALACYCLOVIR HCL 1 G PO TABS: 1000.0000 mg | ORAL_TABLET | Freq: Two times a day (BID) | ORAL | 3 refills | Status: AC

## 2024-08-15 MED ORDER — LIDOCAINE 4 % EX GEL: 1.0000 | Freq: Three times a day (TID) | CUTANEOUS | 1 refills | Status: AC | PRN

## 2024-08-15 NOTE — Progress Notes (Signed)
 "  Acute Office Visit  Subjective:     Patient ID: Kristy Hunt, female    DOB: January 03, 1986, 39 y.o.   MRN: [id number]  No chief complaint on file.   HPI  Discussed the use of AI scribe software for clinical note transcription with the patient, who gave verbal consent to proceed.  History of Present Illness Kristy Hunt is a 39 year old female who presents with painful lesions and concerns about herpes.  Lesions initially appeared as ingrown hairs and were manipulated with tweezers, leading to concerns about herpes due to her blister-like appearance. The lesions are located internally and extend towards the buttocks, causing significant pain and inflammation.  She reports a swollen lymph node in the area. She has no prior history of herpes and has been in a monogamous relationship for three years. She was not sexually active for almost two years before this relationship.  She recently started using a tanning bed in December, which she read could potentially reactivate dormant viruses. She is uncertain about the source of the infection. Her best friend, an ICU nurse, has had herpes for over ten years.  The pain is described as severe, with specific mention of a particularly painful lesion on the left buttock. She attempted self-treatment with Manuka honey, salt, and zinc, which caused burning but seemed to improve the condition slightly.  She has taken ibuprofen  for pain relief and tried hydrocortisone cream, but found it difficult to find appropriate topical treatments. She is concerned about spreading the lesions through scratching.     ROS Per HPI      Objective:    BP 107/62 (BP Location: Left Arm, Patient Position: Sitting, Cuff Size: Normal)   Pulse 72   Wt 138 lb (62.6 kg)   LMP 07/30/2024 (Exact Date)   BMI 24.45 kg/m    Physical Exam Vitals reviewed. Exam conducted with a chaperone present.  Pulmonary:     Effort: Pulmonary effort is normal.   Genitourinary:     Comments: Scattered ulcerations on vulva bilaterally Skin:    Capillary Refill: Capillary refill takes less than 2 seconds.  Neurological:     General: No focal deficit present.     Mental Status: She is alert.  Psychiatric:        Mood and Affect: Mood normal.        Behavior: Behavior normal.        Thought Content: Thought content normal.        Judgment: Judgment normal.     No results found for any visits on 08/15/24.      Assessment & Plan:   Assessment and Plan Assessment & Plan Primary genital herpes simplex virus infection Primary outbreak with painful genital and buttock lesions, swollen lymph nodes. Clinical presentation consistent with HSV. Discussed HSV nature, dormancy, asymptomatic shedding, treatment options, and emotional impact. - Prescribed antiviral medication. - Ordered HSV swab for confirmation. - Ordered STI testing including HIV and hepatitis. - Provided topical cream. - Advised against scratching. - Scheduled follow-up in April for reassessment and Pap smear.     Orders Placed This Encounter  Procedures   Herpes simplex virus culture    Release to patient:   Immediate [1]   RPR+HBsAg+HCVAb+...    Release to patient:   Immediate     Meds ordered this encounter  Medications   valACYclovir  (VALTREX ) 1000 MG tablet    Sig: Take 1 tablet (1,000 mg total) by mouth 2 (two) times daily. Take  for ten days.    Dispense:  20 tablet    Refill:  3   Lidocaine  4 % GEL    Sig: Apply 1 Application topically 3 (three) times daily as needed.    Dispense:  30 g    Refill:  1    No follow-ups on file.  Chelsye Suhre J Oliviya Gilkison, DO  "

## 2024-08-17 LAB — RPR+HBSAG+HCVAB+...
HIV Screen 4th Generation wRfx: NONREACTIVE
Hep C Virus Ab: NONREACTIVE
Hepatitis B Surface Ag: NEGATIVE
RPR Ser Ql: NONREACTIVE

## 2024-08-19 ENCOUNTER — Ambulatory Visit: Payer: Self-pay | Admitting: Family Medicine

## 2024-08-20 ENCOUNTER — Telehealth: Payer: Self-pay

## 2024-10-31 ENCOUNTER — Ambulatory Visit: Admitting: Family Medicine
